# Patient Record
Sex: Female | Born: 1940
Health system: Southern US, Community
[De-identification: ages and names within clinical notes are randomized; demographics above are authoritative.]

## PROBLEM LIST (undated history)

## (undated) DIAGNOSIS — M1990 Unspecified osteoarthritis, unspecified site: Secondary | ICD-10-CM

## (undated) DIAGNOSIS — K635 Polyp of colon: Secondary | ICD-10-CM

## (undated) DIAGNOSIS — K219 Gastro-esophageal reflux disease without esophagitis: Secondary | ICD-10-CM

## (undated) DIAGNOSIS — R000 Tachycardia, unspecified: Secondary | ICD-10-CM

## (undated) DIAGNOSIS — Z8719 Personal history of other diseases of the digestive system: Secondary | ICD-10-CM

## (undated) DIAGNOSIS — G589 Mononeuropathy, unspecified: Secondary | ICD-10-CM

## (undated) DIAGNOSIS — Z8669 Personal history of other diseases of the nervous system and sense organs: Secondary | ICD-10-CM

## (undated) DIAGNOSIS — D509 Iron deficiency anemia, unspecified: Secondary | ICD-10-CM

## (undated) DIAGNOSIS — E7800 Pure hypercholesterolemia, unspecified: Secondary | ICD-10-CM

## (undated) DIAGNOSIS — Z87442 Personal history of urinary calculi: Secondary | ICD-10-CM

## (undated) DIAGNOSIS — Z87891 Personal history of nicotine dependence: Secondary | ICD-10-CM

## (undated) DIAGNOSIS — Z9289 Personal history of other medical treatment: Secondary | ICD-10-CM

## (undated) DIAGNOSIS — Z973 Presence of spectacles and contact lenses: Secondary | ICD-10-CM

## (undated) DIAGNOSIS — J302 Other seasonal allergic rhinitis: Secondary | ICD-10-CM

## (undated) DIAGNOSIS — H3530 Unspecified macular degeneration: Secondary | ICD-10-CM

## (undated) DIAGNOSIS — E039 Hypothyroidism, unspecified: Secondary | ICD-10-CM

## (undated) DIAGNOSIS — T7840XA Allergy, unspecified, initial encounter: Secondary | ICD-10-CM

## (undated) DIAGNOSIS — I10 Essential (primary) hypertension: Secondary | ICD-10-CM

## (undated) HISTORY — DX: Pure hypercholesterolemia, unspecified: E78.00

## (undated) HISTORY — DX: Hypothyroidism, unspecified: E03.9

## (undated) HISTORY — DX: Personal history of urinary calculi: Z87.442

## (undated) HISTORY — PX: ABDOMINAL HYSTERECTOMY: SHX81

## (undated) HISTORY — DX: Polyp of colon: K63.5

## (undated) HISTORY — DX: Essential (primary) hypertension: I10

## (undated) HISTORY — DX: Allergy, unspecified, initial encounter: T78.40XA

## (undated) HISTORY — DX: Personal history of nicotine dependence: Z87.891

## (undated) HISTORY — DX: Gastro-esophageal reflux disease without esophagitis: K21.9

## (undated) HISTORY — DX: Tachycardia, unspecified: R00.0

## (undated) HISTORY — DX: Personal history of other medical treatment: Z92.89

## (undated) HISTORY — DX: Unspecified osteoarthritis, unspecified site: M19.90

## (undated) HISTORY — PX: APPENDECTOMY: SHX54

## (undated) HISTORY — DX: Unspecified macular degeneration: H35.30

---
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
## 1997-10-25 DIAGNOSIS — Z87442 Personal history of urinary calculi: Secondary | ICD-10-CM

## 1997-10-25 HISTORY — DX: Personal history of urinary calculi: Z87.442

## 2004-06-09 ENCOUNTER — Encounter: Admission: RE | Admit: 2004-06-09 | Discharge: 2004-06-09 | Payer: Self-pay | Admitting: Family Medicine

## 2004-08-04 ENCOUNTER — Encounter: Admission: RE | Admit: 2004-08-04 | Discharge: 2004-08-04 | Payer: Self-pay | Admitting: Orthopaedic Surgery

## 2006-04-06 ENCOUNTER — Ambulatory Visit: Payer: Self-pay | Admitting: Cardiovascular Disease

## 2006-04-08 ENCOUNTER — Ambulatory Visit: Payer: Self-pay | Admitting: Cardiology

## 2006-04-25 ENCOUNTER — Encounter: Payer: Self-pay | Admitting: Cardiology

## 2006-04-25 ENCOUNTER — Ambulatory Visit: Payer: Self-pay

## 2006-04-28 ENCOUNTER — Ambulatory Visit: Payer: Self-pay | Admitting: Cardiovascular Disease

## 2006-08-04 ENCOUNTER — Ambulatory Visit: Payer: Self-pay | Admitting: Family Medicine

## 2007-05-02 ENCOUNTER — Ambulatory Visit: Payer: Self-pay | Admitting: Gastroenterology

## 2007-05-15 ENCOUNTER — Ambulatory Visit: Payer: Self-pay | Admitting: Gastroenterology

## 2007-05-15 LAB — HM COLONOSCOPY: HM Colonoscopy: NORMAL

## 2007-05-16 ENCOUNTER — Ambulatory Visit: Payer: Self-pay

## 2007-05-23 ENCOUNTER — Ambulatory Visit: Payer: Self-pay | Admitting: Cardiovascular Disease

## 2007-08-17 ENCOUNTER — Ambulatory Visit: Payer: Self-pay | Admitting: Family Medicine

## 2008-03-25 ENCOUNTER — Ambulatory Visit: Payer: Self-pay | Admitting: Family Medicine

## 2008-03-28 ENCOUNTER — Ambulatory Visit: Payer: Self-pay | Admitting: Family Medicine

## 2008-06-07 ENCOUNTER — Encounter: Admission: RE | Admit: 2008-06-07 | Discharge: 2008-06-07 | Payer: Self-pay | Admitting: Family Medicine

## 2008-06-07 ENCOUNTER — Ambulatory Visit: Payer: Self-pay | Admitting: Family Medicine

## 2008-07-04 ENCOUNTER — Ambulatory Visit: Payer: Self-pay | Admitting: Cardiovascular Disease

## 2008-07-25 ENCOUNTER — Ambulatory Visit: Payer: Self-pay | Admitting: Family Medicine

## 2009-06-18 ENCOUNTER — Encounter (INDEPENDENT_AMBULATORY_CARE_PROVIDER_SITE_OTHER): Payer: Self-pay | Admitting: *Deleted

## 2009-07-31 LAB — HM MAMMOGRAPHY: HM Mammogram: NEGATIVE

## 2009-08-05 DIAGNOSIS — E7800 Pure hypercholesterolemia, unspecified: Secondary | ICD-10-CM

## 2009-08-05 DIAGNOSIS — I10 Essential (primary) hypertension: Secondary | ICD-10-CM | POA: Insufficient documentation

## 2009-08-05 DIAGNOSIS — K219 Gastro-esophageal reflux disease without esophagitis: Secondary | ICD-10-CM

## 2009-08-08 ENCOUNTER — Ambulatory Visit: Payer: Self-pay | Admitting: Cardiovascular Disease

## 2010-02-20 ENCOUNTER — Telehealth (INDEPENDENT_AMBULATORY_CARE_PROVIDER_SITE_OTHER): Payer: Self-pay | Admitting: *Deleted

## 2010-02-22 HISTORY — PX: COLONOSCOPY: SHX174

## 2010-03-03 ENCOUNTER — Encounter (INDEPENDENT_AMBULATORY_CARE_PROVIDER_SITE_OTHER): Payer: Self-pay | Admitting: *Deleted

## 2010-03-04 ENCOUNTER — Ambulatory Visit: Payer: Self-pay | Admitting: Gastroenterology

## 2010-03-18 ENCOUNTER — Ambulatory Visit: Payer: Self-pay | Admitting: Gastroenterology

## 2010-03-20 ENCOUNTER — Encounter: Payer: Self-pay | Admitting: Gastroenterology

## 2010-07-28 ENCOUNTER — Ambulatory Visit: Payer: Self-pay | Admitting: Family Medicine

## 2010-11-24 NOTE — Miscellaneous (Signed)
Summary: [facility] PERVISIT/PREP  Clinical Lists Changes  Medications: Added new medication of MOVIPREP 100 GM  SOLR (PEG-KCL-NACL-NASULF-NA ASC-C) As per prep instructions. - Signed Rx of MOVIPREP 100 GM  SOLR (PEG-KCL-NACL-NASULF-NA ASC-C) As per prep instructions.;  #1 x 0;  Signed;  Entered by: Wyona Almas RN;  Authorized by: Mardella Layman MD [facility];  Method used: Electronically to [facility] [id number], [place], [place]  [address], Ph: [phone], Fax: [phone] Allergies: Added new allergy or adverse reaction of * NIASPAN Observations: Added new observation of NKA: F (03/04/2010 12:49)    Prescriptions: MOVIPREP 100 GM  SOLR (PEG-KCL-NACL-NASULF-NA ASC-C) As per prep instructions.  #1 x 0   Entered by:   Wyona Almas RN   Authorized by:   Mardella Layman MD [facility]   Signed by:   Wyona Almas RN on 03/04/2010   Method used:   Electronically to        [facility] [id number]* (retail)       [address] [place], [place]  [address]       Ph: [phone]       Fax: [phone]   RxID:   [id number]

## 2010-11-24 NOTE — Procedures (Signed)
Summary: Colonoscopy  Patient: [name] Note: All result statuses are Final unless otherwise noted.  Tests: (1) Colonoscopy (COL)   COL Colonoscopy           DONE     [facility]     [address]     [place], [place]  [address]           COLONOSCOPY PROCEDURE REPORT           PATIENT:  Allison [name], Allison [name]  MR#:  [id number]     BIRTHDATE:  1940-10-27, 69 yrs. old  GENDER:  female     ENDOSCOPIST:  Vania Rea. Jarold Motto, MD, [facility]     REF. BY:     PROCEDURE DATE:  03/18/2010     PROCEDURE:  Colonoscopy with snare polypectomy     ASA CLASS:  Class II     INDICATIONS:  colorectal cancer screening, average risk, family     history of colon cancer BROTHER AGE 52.     MEDICATIONS:   Fentanyl 75 mcg IV, Versed 8 mg IV           DESCRIPTION OF PROCEDURE:   After the risks benefits and     alternatives of the procedure were thoroughly explained, informed     consent was obtained.  Digital rectal exam was performed and     revealed no abnormalities.   The LB CF-H180AL [id number] endoscope     was introduced through the anus and advanced to the cecum, which     was identified by both the appendix and ileocecal valve, limited     by a redundant colon.    The quality of the prep was excellent,     using MoviPrep.  The instrument was then slowly withdrawn as the     colon was fully examined.     <<PROCEDUREIMAGES>>           FINDINGS:  A sessile polyp was found. FLAT POLYP AT HEPATIC     FLEXURE HOT SNARE EXCISED.TO PATH FOR EXAM.  This was otherwise a     normal examination of the colon.   Retroflexed views in the rectum     revealed no abnormalities.    The scope was then withdrawn from     the patient and the procedure completed.           COMPLICATIONS:  None     ENDOSCOPIC IMPRESSION:     1) Sessile polyp     2) Otherwise normal examination     R/O ADENOMA.     RECOMMENDATIONS:     1) Follow up colonoscopy in 5 years     REPEAT EXAM:  No        ______________________________     Vania Rea. Jarold Motto, MD, [facility]           CC:           n.     eSIGNED:   Vania Rea. Patterson at 03/18/2010 11:25 AM           Kathrynn Running, [id number]  Note: An exclamation mark (!) indicates a result that was not dispersed into the flowsheet. Document Creation Date: 03/18/2010 11:26 AM _______________________________________________________________________  (1) Order result status: Final Collection or observation date-time: 03/18/2010 11:19 Requested date-time:  Receipt date-time:  Reported date-time:  Referring Physician:   Ordering Physician: Sheryn Bison [phone]) Specimen Source:  Source: Launa Grill Order Number: [id number] Lab site:   Appended Document: Colonoscopy  Procedures Next Due Date:    Colonoscopy: 02/2015

## 2010-11-24 NOTE — Procedures (Signed)
Summary: Colon   Colonoscopy  Procedure date:  05/15/2007  Findings:      Location:  [facility].   Patient Name: Allison [name], Allison [name]. MRN:  Procedure Procedures: Colonoscopy CPT: [phone].  Personnel: Endoscopist: Vania Rea. Jarold Motto, MD.  Referred By: Everardo All Susann Givens, MD.  Exam Location: Exam performed in Outpatient Clinic. Outpatient  Patient Consent: Procedure, Alternatives, Risks and Benefits discussed, consent obtained, from patient. Consent was obtained by the RN.  Indications  Increased Risk Screening: For family history of colorectal neoplasia, in  sibling age at onset: 77.  History  Current Medications: Patient is taking an non-steroidal medication. Patient is not currently taking Coumadin.  Medical/ Surgical History: Hyperlipidemia, Hypothyroidism, Hypertension, Reflux Disease,  Pre-Exam Physical: Performed May 15, 2007. Cardio-pulmonary exam, Rectal exam, Abdominal exam, Extremity exam, Mental status exam WNL.  Comments: Pt. history reviewed/updated, physical exam performed prior to initiation of sedation? yes Exam Exam: Extent of exam reached: Cecum, extent intended: Cecum.  The cecum was identified by appendiceal orifice and IC valve. Patient position: on left side. Time to Cecum: 00:06:17. Time for Withdrawl: 00:08:05. Colon retroflexion performed. Images taken. ASA Classification: II. Tolerance: good.  Monitoring: Pulse and BP monitoring, Oximetry used. Supplemental O2 given. at 2 Liters.  Colon Prep Used Golytely for colon prep. Prep results: good.  Sedation Meds: Patient assessed and found to be appropriate for moderate (conscious) sedation. Fentanyl 50 mcg. given IV. Versed 5 mg. given IV.  Instrument(s): CF 140L. Serial [id number].  Findings - NORMAL EXAM: Cecum to Rectum. Not Seen: Polyps. AVM's. Colitis. Tumors. Melanosis. Crohn's. Diverticulosis. Hemorrhoids.    Comments: Tortuous and redundant colon  noted.... Assessment Normal examination.  Events  Unplanned Interventions: No intervention was required.  Plans Medication Plan: Referring provider to order medications.  Patient Education: Patient given standard instructions for: Patient instructed to get routine colonoscopy every 5 years.  Disposition: After procedure patient sent to recovery. After recovery patient sent home.  Scheduling/Referral: Follow-Up prn.    cc: Ronnald Nian, MD    This report was created from the original endoscopy report, which was reviewed and signed by the above listed endoscopist.

## 2010-11-24 NOTE — Letter (Signed)
Summary: [facility] Instructions  [facility]  [address] [place], [place] [address]   Phone: [phone]  Fax: [phone]       Allison [name]    29-Apr-1941    MRN: [id number]        Procedure Day Allison [name]:  [facility]  03/18/10     Arrival Time:  9:30AM     Procedure Time:  10:30AM     Location of Procedure:                    _X _  [facility] (4th Floor)                        PREPARATION FOR COLONOSCOPY WITH MOVIPREP   Starting 5 days prior to your procedure 03/13/10 do not eat nuts, seeds, popcorn, corn, beans, peas,  salads, or any raw vegetables.  Do not take any fiber supplements (e.g. Metamucil, Citrucel, and Benefiber).  THE DAY BEFORE YOUR PROCEDURE         DATE: 03/17/10  DAY: TUESDAY  1.  Drink clear liquids the entire day-NO SOLID FOOD  2.  Do not drink anything colored red or purple.  Avoid juices with pulp.  No orange juice.  3.  Drink at least 64 oz. (8 glasses) of fluid/clear liquids during the day to prevent dehydration and help the prep work efficiently.  CLEAR LIQUIDS INCLUDE: Water Jello Ice Popsicles Tea (sugar ok, no milk/cream) Powdered fruit flavored drinks Coffee (sugar ok, no milk/cream) Gatorade Juice: apple, white grape, white cranberry  Lemonade Clear bullion, consomm, broth Carbonated beverages (any kind) Strained chicken noodle soup Hard Candy                             4.  In the morning, mix first dose of MoviPrep solution:    Empty 1 Pouch A and 1 Pouch B into the disposable container    Add lukewarm drinking water to the top line of the container. Mix to dissolve    Refrigerate (mixed solution should be used within 24 hrs)  5.  Begin drinking the prep at 5:00 p.m. The MoviPrep container is divided by 4 marks.   Every 15 minutes drink the solution down to the next mark (approximately 8 oz) until the full liter is complete.   6.  Follow completed prep with 16 oz of clear liquid of your choice  (Nothing red or purple).  Continue to drink clear liquids until bedtime.  7.  Before going to bed, mix second dose of MoviPrep solution:    Empty 1 Pouch A and 1 Pouch B into the disposable container    Add lukewarm drinking water to the top line of the container. Mix to dissolve    Refrigerate  THE DAY OF YOUR PROCEDURE      DATE: 03/18/10  DAY: WEDNESDAY  Beginning at 5:30AM (5 hours before procedure):         1. Every 15 minutes, drink the solution down to the next mark (approx 8 oz) until the full liter is complete.  2. Follow completed prep with 16 oz. of clear liquid of your choice.    3. You may drink clear liquids until 8:30AM (2 HOURS BEFORE PROCEDURE).   MEDICATION INSTRUCTIONS  Unless otherwise instructed, you should take regular prescription medications with a small sip of water   as early as possible the morning  of your procedure.   Additional medication instructions: Hold Atenolol-Chlorthalidone the morning of procedure.         OTHER INSTRUCTIONS  You will need a responsible adult at least 71 years of age to accompany you and drive you home.   This person must remain in the waiting room during your procedure.  Wear loose fitting clothing that is easily removed.  Leave jewelry and other valuables at home.  However, you may wish to bring a book to read or  an iPod/MP3 player to listen to music as you wait for your procedure to start.  Remove all body piercing jewelry and leave at home.  Total time from sign-in until discharge is approximately 2-3 hours.  You should go home directly after your procedure and rest.  You can resume normal activities the  day after your procedure.  The day of your procedure you should not:   Drive   Make legal decisions   Operate machinery   Drink alcohol   Return to work  You will receive specific instructions about eating, activities and medications before you leave.    The above instructions have been  reviewed and explained to me by   Wyona Almas RN  Mar 04, 2010 1:23 PM     I fully understand and can verbalize these instructions _____________________________ Date _________

## 2010-11-24 NOTE — Letter (Signed)
Summary: Patient Notice- Polyp Results  [facility]  [address] [place], [place] [address]   Phone: [phone]  Fax: [phone]        Mar 20, 2010 MRN: [id number]    Allison [name] [address] [place], [place]  [address]    Dear [name],  I am pleased to inform you that the colon polyp(s) removed during your recent colonoscopy was (were) found to be benign (no cancer detected) upon pathologic examination.  I recommend you have a repeat colonoscopy examination in 5_ years to look for recurrent polyps, as having colon polyps increases your risk for having recurrent polyps or even colon cancer in the future.  Should you develop new or worsening symptoms of abdominal pain, bowel habit changes or bleeding from the rectum or bowels, please schedule an evaluation with either your primary care physician or with me.  Additional information/recommendations:  xx__ No further action with gastroenterology is needed at this time. Please      follow-up with your primary care physician for your other healthcare      needs.  __ Please call [phone] to schedule a return visit to review your      situation.  __ Please keep your follow-up visit as already scheduled.  __ Continue treatment plan as outlined the day of your exam.  Please call us if you are having persistent problems or have questions about your condition that have not been fully answered at this time.  Sincerely,  Mardella Layman MD [facility]  This letter has been electronically signed by your physician.  Appended Document: Patient Notice- Polyp Results letter mailed.

## 2010-11-24 NOTE — Progress Notes (Signed)
Summary: recall COL   Phone Note Call from Patient Call back at Home Phone [phone]   Caller: husband Call For: Dr. Jarold Motto Reason for Call: Talk to Nurse Summary of Call: brother has passed from COL cancer and pt would like to move up COL recall date Initial call taken by: Vallarie Mare,  February 20, 2010 12:15 PM  Follow-up for Phone Call        Pt had normal colon 05/15/07.  See report. Follow-up by: Ashok Cordia RN,  February 20, 2010 12:47 PM  Additional Follow-up for Phone Call Additional follow up Details #1::        ok Additional Follow-up by: Mardella Layman MD [facility],  Feb 23, 2010 8:20 AM    Additional Follow-up for Phone Call Additional follow up Details #2::    Sch previsit and colonoscopy. Follow-up by: Ashok Cordia RN,  Feb 23, 2010 8:51 AM  Additional Follow-up for Phone Call Additional follow up Details #3:: Details for Additional Follow-up Action Taken: Pt. is sch'd for 03-18-10 for colonoscopy. Aware to bring meds, ins. card and cx policy advised Additional Follow-up by: Karna Christmas,  Feb 24, 2010 2:56 PM

## 2011-03-09 NOTE — Assessment & Plan Note (Signed)
[facility] HEALTHCARE                            CARDIOLOGY OFFICE NOTE   Allison [name], Allison [name]                     MRN:          [id number]  DATE:05/23/2007                            DOB:          August 07, 1941    SUBJECTIVE:  [name] returns today for followup.  I last saw her  back in July 2007.  She has had palpitations and dyspnea.  She has had  previous fen/Phen use.  She had a 2-D echocardiogram that was done in  July 2007, and which showed an ejection fraction of 55%-60%, normal  aortic valve with trivial mitral insufficiency and MAC.  There was no  evidence of fen/Phen valvulopathy.  Since I last saw her she has been  doing well.  She continues to get occasional exertional dyspnea.  She  has not had significant chest pain or palpitations.  I reviewed her  Myoview study today.  She was able to exercise for seven minutes and 30  seconds.  Her blood pressure response was appropriate.  Her peak heart  rate was 125.  The Myoview images were normal with an ejection fraction  of 72%.  We went over these results and she was very happy about it.   She does have hyperlipidemia and hypertension.  These seem to be well-  controlled.  I do not have a recent lipid profile on her.   PRIMARY CARE PHYSICIAN:  Allison [name], M.D.   REVIEW OF SYSTEMS:  Is otherwise negative.   CURRENT MEDICATIONS:  1. Premarin 0.6 mg daily.  2. Levothroid 112 mcg daily.  3. Lipitor 10 mg daily.  4. Chlorthalidone 25 mg daily.  5. Omeprazole 20 mg daily.  6. An aspirin daily.  7. Atenolol 50 mg daily.   PHYSICAL EXAMINATION:  GENERAL:  A healthy-appearing elderly white  female, in no distress.  Affect is appropriate.  VITAL SIGNS:  Weight is 193 pounds, respiratory rate 14, blood pressure  130/72, pulse 60 and regular.  HEENT:  Normal.  NECK:  There is no thyromegaly, no lymphadenopathy.  No JVP elevation.  No bruits.  LUNGS:  Clear with good diaphragmatic motion.  No  wheezing.  HEART:  There is an S1 and S2, without murmur.  PMI is normal.  ABDOMEN:  Benign.  Bowel sounds positive.  No hepatosplenomegaly.  No  hepatojugular reflux.  No abdominal aortic aneurysm.  No tenderness, no  bruits.  EXTREMITIES:  Distal pulses intact.  No edema.  NEUROLOGIC:  Nonfocal.  MUSCULOSKELETAL:  No weakness.  SKIN:  Warm and dry.   IMPRESSION:  1. Exertional dyspnea, not likely cardiac-related:  Normal left      ventricular function by Myoview and echocardiogram, most likely      functional.  2. History of fen/Phen use with trivial mitral regurgitation:  No      significant murmur on examination.  I do not think she needs      another echocardiogram for at least two to three years.  She is now      11 years out from fen/Phen and I doubt that she will develop  a      valvulopathy.  3. Hypertension, currently well-controlled:  Here in the office today      she was 128/70.  She will continue her diuretic and beta blocker.  4. Hypercholesterolemia:  Continue Lipitor 10 mg daily.  Follow up      with a  lipid and liver profile with Dr. Susann Givens.  5. Hypothyroidism:  Continue 112 mcg of Synthroid.  She understands      the connection between cholesterol levels and thyroid.  She will      follow up with Dr. Susann Givens and hopefully keep her TSH under 3.  6. Dyspnea on exertion with palpitations:  A negative stress test.      Myoview results are gone over with the patient.  No evidence of      coronary artery disease.  Continue risk factor modifications.     Noralyn Pick. Eden Emms, MD, [facility]  Electronically Signed    PCN/MedQ  DD: 05/23/2007  DT: 05/24/2007  Job #: [id number]

## 2011-03-09 NOTE — Assessment & Plan Note (Signed)
[name] HEALTHC[facility]                            CARDIOLOGY OFFICE NOTE   Allison [name], Allison [name]                     MRN:          [id number]  DATE:07/04/2008                            DOB:          [birth year]    Allison [name] is a 70 year old patient who I have seen for murmur and  previous Fen-phen use.  She has hypercholesterolemia and hypertension.  She is doing fairly well.  She spends 6 months of her time at [facility] [place].  She is going there in October.  Her  primary care MD increased her Zocor from 20-40 in April.  She needs to  have a followup lipid and liver profile in October; otherwise, she is  doing well.  She has not had any significant problems relative to her  heart.  There has been no palpitation, shortness of breath, PND, or  orthopnea.  No lower extremity edema.  She has had a URI or flu for the  last 4 weeks.  She has not been started on any antibiotics.  The  Benadryl does not seem to be helping.  I suggested her that she switch  to Claritin and consider Nasonex or neti pot.  I would also have her  referred back to her primary care doctor for possible Z-PAK or  doxycycline.  She has a lot of nasal congestion and nonproductive cough.   She has not had a fever.  She even wondered if she had swine flu at one  point.  At this point, she is too late in her course to consider  Tamiflu.   REVIEW OF SYSTEMS:  Otherwise negative.   MEDICATIONS:  She is currently taking;  1. Chlorthalidone 25 a day.  2. Omeprazole 20 a day.  3. Aspirin a day.  4. Vitamins.  5. Atenolol 50 a day.  6. Zocor 40 a day.  7. Synthroid 112 mcg a day.  8. Premarin.   PHYSICAL EXAMINATION:  GENERAL:  Her exam is remarkable for a elderly  white female in no distress.  VITAL SIGNS:  Weight is 196, blood pressure 124/70, pulse 64 and  regular, and respiratory rate 14, afebrile.  HEENT:  Unremarkable.  NECK:  Carotids are normal without bruit.   No lymphadenopathy,  thyromegaly, or JVP elevation.  LUNGS:  Clear.  Good diaphragmatic motion.  No wheezing.  CARDIAC:  S1 and S2 with a soft systolic murmur.  PMI normal.  ABDOMEN:  Benign.  Bowel sounds positive.  No AAA.  No tenderness.  No  bruit.  No hepatosplenomegaly or hepatojugular reflux.  EXTREMITIES:  Distal pulses are intact.  No edema.  NEURO:  Nonfocal.  SKIN:  Warm and dry.  MUSCULOSKELETAL:  No muscular weakness.   EKG is normal.   IMPRESSION:  1. Faint, benign systolic murmur despite Fen-phen use.  No need for      followup echo.  Her last echocardiogram done on April 26, 2006,      showed no significant valvular disease.  2. Upper respiratory illness.  Follow up with primary care MD.  Consider macrolide therapy with neti pot and switch to a Claritin      as an antihistamine.  3. Hypertension, currently well controlled.  Continue current dose of      diuretic and beta-blocker.  4. Hypercholesterolemia, currently not well controlled.  Zocor      increased in April.  Lipid and liver profile in October when she      goes to [place].  5. History of reflux.  Continue omeprazole.  Consider switching to      Protonix in light of her persistent cough.  Reflux may be playing a      role in this.  Overall, I think her heart is doing fine and I will      see her back in a year.     Noralyn Pick. Eden Emms, MD, Allison [name]  Electronically Signed    PCN/MedQ  DD: 07/04/2008  DT: 07/05/2008  Job #: [id number]

## 2011-04-20 ENCOUNTER — Encounter: Payer: Self-pay | Admitting: Family Medicine

## 2011-06-07 ENCOUNTER — Encounter: Payer: Self-pay | Admitting: Medical

## 2011-06-07 ENCOUNTER — Ambulatory Visit (INDEPENDENT_AMBULATORY_CARE_PROVIDER_SITE_OTHER): Payer: Medicare Other | Admitting: Medical

## 2011-06-07 VITALS — BP 130/88 | HR 64 | Temp 97.7°F | Resp 20 | Ht 67.0 in | Wt 203.0 lb

## 2011-06-07 DIAGNOSIS — M79641 Pain in right hand: Secondary | ICD-10-CM

## 2011-06-07 DIAGNOSIS — M25849 Other specified joint disorders, unspecified hand: Secondary | ICD-10-CM

## 2011-06-07 DIAGNOSIS — R300 Dysuria: Secondary | ICD-10-CM

## 2011-06-07 DIAGNOSIS — M79609 Pain in unspecified limb: Secondary | ICD-10-CM

## 2011-06-07 LAB — POCT URINALYSIS DIPSTICK
Bilirubin, UA: NEGATIVE
Blood, UA: NEGATIVE
Glucose, UA: NEGATIVE
Ketones, UA: NEGATIVE
Leukocytes, UA: POSITIVE
Nitrite, UA: NEGATIVE
Spec Grav, UA: 1.01
Urobilinogen, UA: NEGATIVE
pH, UA: 8

## 2011-06-07 NOTE — Progress Notes (Signed)
Subjective:   HPI  Allison [name] is a 70 y.o. female who presents for hand pain.  She notes 2 years ago in July 4th, was helping to break apart 2 dogs fighting.  Ended up getting bit on right hand.  She notes off and on problems with the right hand including "knots" since then.  Last week started getting fluid filled knots on right wrist and pain.  Uses wrist splint which helps, but this limits her ability to do things like cook or use the bathroom.  Motion and use of the wrist aggravates the pain.   No other aggravating or relieving factors.    She also notes bad smelling urine the last few days without other urinary symptoms.  No other c/o.  The following portions of the patient's history were reviewed and updated as appropriate: allergies, current medications, past family history, past medical history, past social history, past surgical history and problem list.  Past Medical History  Diagnosis Date  . Allergy     RHINITIS  . Hypercholesterolemia   . Hypothyroid   . Tachycardia   . Former smoker    Review of Systems Constitutional: denies fever, chills, sweats Hematology: denies bleeding or bruising problems Musculoskeletal: +right wrist swelling, pain and knots, otherwise denies arthralgias, myalgias, joint swelling, back pain Neurology: no headache, weakness, tingling, numbness Urology: no hematuria, frequently, urgency GI: no abdominal pain, nausea, vomiting, diarrhea     Objective:   Physical Exam  General appearance: alert, no distress, WD/WN, white female Skin: no erythema or ecchymosis Musculoskeletal: right dorsal hand proximally and wrist dorsal with 3 areas of swelling that appears to be 2-3 separate fluid filled, nontender, cystic lesions, not hard, but somewhat ganglion appearing.  Mild tenderness of the right wrist and hand in the same area. Otherwise bilat hands and UE nontender, no swelling, no obvious deformity Extremities: no edema, no cyanosis, no  clubbing Pulses: 2+ symmetric, upper extremities, normal cap refill Neurological: bilat arms and hands with normal strength and sensation   Assessment :    Encounter Diagnoses  Name Primary?  . Hand pain, right Yes  . Cyst of joint of hand   . Dysuria      Plan:  Hand pain/cyst - suggestive of ganglion, but exam is a little atypical.   Will refer to [facility].  She saw them in remote past for shoulder problem.   Dysuria - urine culture sent.  She was advised to call if worse symptoms of UTI in the meantime.

## 2011-06-09 ENCOUNTER — Other Ambulatory Visit: Payer: Self-pay | Admitting: Medical

## 2011-06-09 ENCOUNTER — Telehealth: Payer: Self-pay | Admitting: *Deleted

## 2011-06-09 LAB — URINE CULTURE: Colony Count: >=100000

## 2011-06-09 MED ORDER — NITROFURANTOIN MONOHYD MACRO 100 MG PO CAPS: 100.0000 mg | ORAL_CAPSULE | Freq: Two times a day (BID) | ORAL | Status: AC

## 2011-06-09 NOTE — Telephone Encounter (Addendum)
Message copied by Dorthula Perfect on Wed Jun 09, 2011 12:01 PM ------      Message from: Aleen Campi, DAVID S      Created: Wed Jun 09, 2011  8:18 AM       Urine culture +.  I will send out Macrobid antibiotic.   Pt notified of urine culture results and is aware of antibiotic sent to pharmacy. CM, LPN

## 2012-06-17 ENCOUNTER — Ambulatory Visit (INDEPENDENT_AMBULATORY_CARE_PROVIDER_SITE_OTHER): Payer: Medicare Other | Admitting: Emergency Medicine

## 2012-06-17 VITALS — BP 135/74 | HR 69 | Temp 97.3°F | Resp 16 | Ht 67.0 in | Wt 192.4 lb

## 2012-06-17 DIAGNOSIS — M25529 Pain in unspecified elbow: Secondary | ICD-10-CM

## 2012-06-17 LAB — POCT CBC
Granulocyte percent: 75.6 %G (ref 37–80)
HCT, POC: 40.7 % (ref 37.7–47.9)
Hemoglobin: 12.6 g/dL (ref 12.2–16.2)
Lymph, poc: 1.9 (ref 0.6–3.4)
MCH, POC: 31.2 pg (ref 27–31.2)
MCHC: 31 g/dL — AB (ref 31.8–35.4)
MCV: 100.8 fL — AB (ref 80–97)
MID (cbc): 0.6 (ref 0–0.9)
MPV: 10.4 fL (ref 0–99.8)
POC Granulocyte: 7.6 — AB (ref 2–6.9)
POC LYMPH PERCENT: 18.6 %L (ref 10–50)
POC MID %: 5.8 %M (ref 0–12)
Platelet Count, POC: 181 10*3/uL (ref 142–424)
RBC: 4.04 M/uL (ref 4.04–5.48)
RDW, POC: 15.2 %
WBC: 10 10*3/uL (ref 4.6–10.2)

## 2012-06-17 MED ORDER — SULFAMETHOXAZOLE-TRIMETHOPRIM 800-160 MG PO TABS: 1.0000 | ORAL_TABLET | Freq: Two times a day (BID) | ORAL | Status: AC

## 2012-06-17 MED ORDER — NAPROXEN SODIUM 550 MG PO TABS: 550.0000 mg | ORAL_TABLET | Freq: Two times a day (BID) | ORAL | Status: AC

## 2012-06-17 NOTE — Progress Notes (Signed)
Date:  06/17/2012   Name:  Allison [name]   DOB:  [birth year]   MRN:  [id number] Gender: female  Age: 71 y.o.  PCP:  No primary provider on file.    Chief Complaint: left elbow swelling   History of Present Illness:  Allison [name] is a 71 y.o. pleasant patient who presents with the following:  Spent the day yesterday gardening and last night noted a swelling distal to her olecranon on the extensor surface of the forearm.  She recalls no history of injury or overuse.  No bites or stings.  Now has marked swelling and tenderness.  Little erythema and no cellulitis.  Not fluctuant.    Patient Active Problem List  Diagnosis  . HYPERCHOLESTEROLEMIA  . HYPERTENSION  . GERD  . MURMUR    Past Medical History  Diagnosis Date  . Allergy     RHINITIS  . Hypercholesterolemia   . Hypothyroid   . Tachycardia   . Former smoker     Past Surgical History  Procedure Date  . Abdominal hysterectomy     History  Substance Use Topics  . Smoking status: Former Smoker    Quit date: 10/24/1993  . Smokeless tobacco: Never Used  . Alcohol Use: 1.5 oz/week    3 drink(s) per week    Family History  Problem Relation Age of Onset  . Arthritis Mother   . Diabetes Mother   . Arthritis Father   . Cancer Paternal Grandmother     Allergies  Allergen Reactions  . Niacin     REACTION: rash    Medication list has been reviewed and updated.  Current Outpatient Prescriptions on File Prior to Visit  Medication Sig Dispense Refill  . aspirin 81 MG tablet Take 81 mg by mouth daily.        Marland Kitchen atenolol-chlorthalidone (TENORETIC) 50-25 MG per tablet Take 1 tablet by mouth daily.      Marland Kitchen co-enzyme Q-10 30 MG capsule Take 30 mg by mouth daily.        Marland Kitchen estrogens, conjugated, (PREMARIN) 0.3 MG tablet Take 0.3 mg by mouth daily. Take daily for 21 days then do not take for 7 days.       Marland Kitchen levothyroxine (SYNTHROID, LEVOTHROID) 112 MCG tablet Take 112 mcg by mouth daily.        Marland Kitchen omeprazole  (PRILOSEC) 20 MG capsule Take 20 mg by mouth daily.        . simvastatin (ZOCOR) 80 MG tablet Take 80 mg by mouth at bedtime.        Marland Kitchen atenolol (TENORMIN) 50 MG tablet Take 50 mg by mouth daily.       . diphenhydrAMINE (BENADRYL) 12.5 MG chewable tablet Chew 12.5 mg by mouth 4 (four) times daily as needed.          Review of Systems:  As per HPI, otherwise negative.    Physical Examination: Filed Vitals:   06/17/12 1527  BP: 135/74  Pulse: 69  Temp: 97.3 F (36.3 C)  Resp: 16   Filed Vitals:   06/17/12 1527  Height: 5\' 7"  (1.702 m)  Weight: 192 lb 6.4 oz (87.272 kg)   Body mass index is 30.13 kg/(m^2). Ideal Body Weight: Weight in (lb) to have BMI = 25: 159.3    GEN: WDWN, NAD, Non-toxic, Alert & Oriented x 3 HEENT: Atraumatic, Normocephalic.  Ears and Nose: No external deformity. EXTR: No clubbing/cyanosis/edema NEURO: Normal gait.  PSYCH: Normally interactive. Conversant. Not  depressed or anxious appearing.  Calm demeanor.  Left elbow:  Full active and passive ROM.  No cellulitis or fluctuance.  Not involving olecranon bursa  Assessment and Plan: Hematoma elbow Local heat Follow up as needed Septra anaprox  Carmelina Dane, MD  Results for orders placed in visit on 06/17/12  POCT CBC      Component Value Range   WBC 10.0  4.6 - 10.2 K/uL   Lymph, poc 1.9  0.6 - 3.4   POC LYMPH PERCENT 18.6  10 - 50 %L   MID (cbc) 0.6  0 - 0.9   POC MID % 5.8  0 - 12 %M   POC Granulocyte 7.6 (*) 2 - 6.9   Granulocyte percent 75.6  37 - 80 %G   RBC 4.04  4.04 - 5.48 M/uL   Hemoglobin 12.6  12.2 - 16.2 g/dL   HCT, POC 16.1  09.6 - 47.9 %   MCV 100.8 (*) 80 - 97 fL   MCH, POC 31.2  27 - 31.2 pg   MCHC 31.0 (*) 31.8 - 35.4 g/dL   RDW, POC 04.5     Platelet Count, POC 181  142 - 424 K/uL   MPV 10.4  0 - 99.8 fL

## 2012-08-08 LAB — HM MAMMOGRAPHY

## 2012-08-08 LAB — HM DEXA SCAN

## 2012-08-10 ENCOUNTER — Encounter: Payer: Self-pay | Admitting: Internal Medicine

## 2013-01-11 ENCOUNTER — Encounter: Payer: Self-pay | Admitting: Medical

## 2013-02-16 ENCOUNTER — Other Ambulatory Visit: Payer: Self-pay | Admitting: *Deleted

## 2013-02-16 DIAGNOSIS — R0989 Other specified symptoms and signs involving the circulatory and respiratory systems: Secondary | ICD-10-CM

## 2013-02-22 ENCOUNTER — Ambulatory Visit
Admission: RE | Admit: 2013-02-22 | Discharge: 2013-02-22 | Disposition: A | Discharge status: Home / Self Care | Payer: Medicare Other | Source: Ambulatory Visit | Attending: *Deleted | Admitting: *Deleted

## 2013-02-22 DIAGNOSIS — R0989 Other specified symptoms and signs involving the circulatory and respiratory systems: Secondary | ICD-10-CM

## 2014-03-23 ENCOUNTER — Ambulatory Visit (INDEPENDENT_AMBULATORY_CARE_PROVIDER_SITE_OTHER): Payer: Medicare Other | Admitting: Family Medicine

## 2014-03-23 VITALS — BP 148/60 | HR 91 | Temp 97.3°F | Resp 16 | Ht 66.0 in | Wt 192.8 lb

## 2014-03-23 DIAGNOSIS — M545 Low back pain, unspecified: Secondary | ICD-10-CM

## 2014-03-23 DIAGNOSIS — S335XXA Sprain of ligaments of lumbar spine, initial encounter: Secondary | ICD-10-CM

## 2014-03-23 DIAGNOSIS — S39012A Strain of muscle, fascia and tendon of lower back, initial encounter: Secondary | ICD-10-CM

## 2014-03-23 DIAGNOSIS — R10A0 Flank pain, unspecified side: Secondary | ICD-10-CM

## 2014-03-23 DIAGNOSIS — R109 Unspecified abdominal pain: Secondary | ICD-10-CM

## 2014-03-23 LAB — POCT URINALYSIS DIPSTICK
Bilirubin, UA: NEGATIVE
Blood, UA: NEGATIVE
Glucose, UA: NEGATIVE
Ketones, UA: NEGATIVE
Leukocytes, UA: NEGATIVE
Nitrite, UA: NEGATIVE
Protein, UA: NEGATIVE
Spec Grav, UA: 1.01
Urobilinogen, UA: 0.2
pH, UA: 6.5

## 2014-03-23 LAB — POCT UA - MICROSCOPIC ONLY
Casts, Ur, LPF, POC: NEGATIVE
Crystals, Ur, HPF, POC: NEGATIVE
Mucus, UA: NEGATIVE
RBC, urine, microscopic: NEGATIVE
Yeast, UA: NEGATIVE

## 2014-03-23 MED ORDER — METHOCARBAMOL 500 MG PO TABS: ORAL_TABLET | ORAL | Status: DC

## 2014-03-23 MED ORDER — NAPROXEN 500 MG PO TABS: 500.0000 mg | ORAL_TABLET | Freq: Two times a day (BID) | ORAL | Status: DC

## 2014-03-23 NOTE — Patient Instructions (Signed)
Try to avoid heavy lifting or straining. However light activity and keeping your back moving some is good for you.  Take the muscle relaxants one in the morning, one in the afternoon, and one or 2 at bedtime  Take the anti-inflammatory pain reliever, naproxen, one twice daily for back pain  Return if worse or not improving. I would expect this to be improving over the next 5-7 days for

## 2014-03-23 NOTE — Progress Notes (Signed)
Subjective: 73 year old lady who has been having low back pain for the last 3 days. It hurts her in the low back and across the lower flanks. She knows of no specific injury. In fact she has been less physical activity this week and noticed. It hurts more sitting than lying down. No radiation of the pain. No nausea vomiting diarrhea constipation. No urinary symptoms. Has not had major problems with her back in the past. There is no radiculopathy.  Objective: Pleasant alert lady in no major distress. Has fair range of motion of her back with anterior flexion and lateral tilt and rotation she does have pain in the very low back and around to the flanks. Deep tender reflexes are symmetrical. Straight leg raising test is seated and laying are negative. Abdomen is soft with some mild low abdominal tenderness, nonspecific.   Results for orders placed in visit on 03/23/14  POCT UA - MICROSCOPIC ONLY      Result Value Ref Range   WBC, Ur, HPF, POC 0-1     RBC, urine, microscopic neg     Bacteria, U Microscopic trace     Mucus, UA neg     Epithelial cells, urine per micros 6-11     Crystals, Ur, HPF, POC neg     Casts, Ur, LPF, POC neg     Yeast, UA neg    POCT URINALYSIS DIPSTICK      Result Value Ref Range   Color, UA yellow     Clarity, UA clear     Glucose, UA neg     Bilirubin, UA neg     Ketones, UA neg     Spec Grav, UA 1.010     Blood, UA neg     pH, UA 6.5     Protein, UA neg     Urobilinogen, UA 0.2     Nitrite, UA neg     Leukocytes, UA Negative     Assessment: Low back pain and strain  Plan: Muscle relaxant and anti-inflammatory medication. Given a little bit of time. If it is not improving she will need further evaluation.

## 2014-03-25 HISTORY — PX: OTHER SURGICAL HISTORY: SHX169

## 2014-04-07 ENCOUNTER — Ambulatory Visit (INDEPENDENT_AMBULATORY_CARE_PROVIDER_SITE_OTHER): Payer: Medicare Other | Admitting: Emergency Medicine

## 2014-04-07 VITALS — BP 126/62 | HR 88 | Temp 97.7°F | Resp 20 | Ht 67.0 in | Wt 189.4 lb

## 2014-04-07 DIAGNOSIS — J209 Acute bronchitis, unspecified: Secondary | ICD-10-CM

## 2014-04-07 MED ORDER — HYDROCOD POLST-CHLORPHEN POLST 10-8 MG/5ML PO LQCR: 5.0000 mL | Freq: Two times a day (BID) | ORAL | Status: DC | PRN

## 2014-04-07 MED ORDER — AZITHROMYCIN 250 MG PO TABS: ORAL_TABLET | ORAL | Status: DC

## 2014-04-07 NOTE — Progress Notes (Signed)
[facility] [address] [place] [address] [phone]  Date:  04/07/2014   Name:  Allison [name]   DOB:  [birth year]   MRN:  [id number]  PCP:  No PCP Per Patient    Chief Complaint: Cough   History of Present Illness:  Allison [name] is a 73 y.o. very pleasant female patient who presents with the following:  Has a cough productive of purulent sputum.  Some exertional shortness of breath. Now has pain in right anterior inframammary chest wall worse with cough.  No fever or chills, nausea or vomiting, coryza or sore throat.  Non smoker.  No ill contacts.  No improvement with over the counter medications or other home remedies. Denies other complaint or health concern today.   Patient Active Problem List   Diagnosis Date Noted  . HYPERCHOLESTEROLEMIA 08/05/2009  . HYPERTENSION 08/05/2009  . GERD 08/05/2009  . MURMUR 08/05/2009    Past Medical History  Diagnosis Date  . Allergy     RHINITIS  . Hypercholesterolemia   . Hypothyroid   . Tachycardia   . Former smoker     Past Surgical History  Procedure Laterality Date  . Abdominal hysterectomy    . Appendectomy      History  Substance Use Topics  . Smoking status: Former Smoker -- 1.00 packs/day for 20 years    Types: Cigarettes    Quit date: 10/24/1993  . Smokeless tobacco: Never Used  . Alcohol Use: 1.5 oz/week    3 drink(s) per week    Family History  Problem Relation Age of Onset  . Arthritis Mother   . Diabetes Mother   . Arthritis Father   . Cancer Paternal Grandmother   . Diabetes Father   . Hyperlipidemia Father   . Cancer Brother   . Heart failure Sister   . Hyperlipidemia Sister     Allergies  Allergen Reactions  . Niacin     REACTION: rash    Medication list has been reviewed and updated.  Current Outpatient Prescriptions on File Prior to Visit  Medication Sig Dispense Refill  . aspirin 81 MG tablet Take 81 mg by mouth daily.        . beta carotene  w/minerals (OCUVITE) tablet Take 1 tablet by mouth daily.      Marland Kitchen co-enzyme Q-10 30 MG capsule Take 30 mg by mouth daily.        Marland Kitchen estrogens, conjugated, (PREMARIN) 0.3 MG tablet Take 0.3 mg by mouth daily. Take daily for 21 days then do not take for 7 days.       Marland Kitchen levothyroxine (SYNTHROID, LEVOTHROID) 150 MCG tablet Take 150 mcg by mouth daily before breakfast.      . losartan-hydrochlorothiazide (HYZAAR) 100-25 MG per tablet Take 1 tablet by mouth daily.      . methocarbamol (ROBAXIN) 500 MG tablet Take one in the morning, one in the afternoon, and 1-2 at bedtime for muscle relaxant  30 tablet  0  . naproxen (NAPROSYN) 500 MG tablet Take 1 tablet (500 mg total) by mouth 2 (two) times daily with a meal.  20 tablet  0  . omeprazole (PRILOSEC) 20 MG capsule Take 20 mg by mouth daily.        . simvastatin (ZOCOR) 80 MG tablet Take 80 mg by mouth at bedtime.         No current facility-administered medications on file prior to visit.    Review of Systems:  As per HPI, otherwise negative.    Physical Examination: Filed Vitals:   04/07/14 1350  BP: 126/62  Pulse: 88  Temp: 97.7 F (36.5 C)  Resp: 20   Filed Vitals:   04/07/14 1350  Height: 5\' 7"  (1.702 m)  Weight: 189 lb 6.4 oz (85.911 kg)   Body mass index is 29.66 kg/(m^2). Ideal Body Weight: Weight in (lb) to have BMI = 25: 159.3  GEN: WDWN, NAD, Non-toxic, A & O x 3 HEENT: Atraumatic, Normocephalic. Neck supple. No masses, No LAD. Ears and Nose: No external deformity. CV: RRR, No M/G/R. No JVD. No thrill. No extra heart sounds. PULM: CTA B, no wheezes, crackles, rhonchi. No retractions. No resp. distress. No accessory muscle use. ABD: S, NT, ND, +BS. No rebound. No HSM. EXTR: No c/c/e NEURO Normal gait.  PSYCH: Normally interactive. Conversant. Not depressed or anxious appearing.  Calm demeanor.    Assessment and Plan: Bronchitis zpak tussionex   Signed,  Ellison Carwin, MD

## 2014-04-07 NOTE — Patient Instructions (Signed)

## 2014-04-18 ENCOUNTER — Encounter: Payer: Self-pay | Admitting: Family Medicine

## 2014-04-18 ENCOUNTER — Ambulatory Visit (INDEPENDENT_AMBULATORY_CARE_PROVIDER_SITE_OTHER): Payer: Medicare Other | Admitting: Family Medicine

## 2014-04-18 VITALS — BP 112/70 | HR 80 | Temp 97.5°F | Ht 66.0 in | Wt 188.0 lb

## 2014-04-18 DIAGNOSIS — R059 Cough, unspecified: Secondary | ICD-10-CM

## 2014-04-18 DIAGNOSIS — R05 Cough: Secondary | ICD-10-CM

## 2014-04-18 MED ORDER — BENZONATATE 200 MG PO CAPS: 200.0000 mg | ORAL_CAPSULE | Freq: Three times a day (TID) | ORAL | Status: DC | PRN

## 2014-04-18 MED ORDER — HYDROCODONE-HOMATROPINE 5-1.5 MG/5ML PO SYRP: 5.0000 mL | ORAL_SOLUTION | Freq: Three times a day (TID) | ORAL | Status: DC | PRN

## 2014-04-18 NOTE — Progress Notes (Signed)
Chief Complaint  Patient presents with  . Bronchitis    saw [facility] 04/07/14 diagnosed with bronchitis, took all rx'd meds -still has cough(clear mucus). Is concerned bc she is having cataract surgery next Monday and they will not do the procedure if she is coughing.     She was seen at the urgent care 6/14 with complaint of 3 days of cough productive of purulent sputum. Some exertional shortness of breath, and then developed pain in right anterior chest wall worse with cough. She was prescribed a Z-pak and Tussionex for diagnosis of bronchitis.  She seemed better at first, the phlegm production pretty much resolved, just some clear mucus occasionally.  She now has developed a dry hacking cough.  Last night she sipped on ginger brandy to use prn coughing spells. She feels winded with exertion at times.  Feels slightly dizzy when she stands quickly. Cough seems to be the same during the day and at night, not triggered by exertion/activity or any other known triggers. Still has slight discomfort under the right breast only when coughing--improved from prior to taking the antibiotics.  She completed the z-pak, and also finished the Tussionex--she reports it only lasted 6 days, taking it twice daily.  It was helpful when she was taking it, controlling the cough.  She is scheduled for cataract surgery for Monday, which will be cancelled if she is coughing.  Past Medical History  Diagnosis Date  . Allergy     RHINITIS  . Hypercholesterolemia   . Hypothyroid   . Tachycardia   . Former smoker    Past Surgical History  Procedure Laterality Date  . Abdominal hysterectomy    . Appendectomy     History   Social History  . Marital Status: Married    Spouse Name: N/A    Number of Children: N/A  . Years of Education: N/A   Occupational History  . Not on file.   Social History Main Topics  . Smoking status: Former Smoker -- 1.00 packs/day for 20 years    Types: Cigarettes    Quit date:  10/24/1993  . Smokeless tobacco: Never Used  . Alcohol Use: 1.5 oz/week    3 drink(s) per week     Comment: 4-5 glasses of wine per week.   . Drug Use: No  . Sexual Activity: Not on file   Other Topics Concern  . Not on file   Social History Narrative  . No narrative on file   Outpatient Encounter Prescriptions as of 04/18/2014  Medication Sig  . aspirin 81 MG tablet Take 81 mg by mouth daily.    . beta carotene w/minerals (OCUVITE) tablet Take 2 tablets by mouth daily.   Marland Kitchen co-enzyme Q-10 30 MG capsule Take 30 mg by mouth daily.    Marland Kitchen estrogens, conjugated, (PREMARIN) 0.3 MG tablet Take 0.3 mg by mouth daily. Take daily for 21 days then do not take for 7 days.   Marland Kitchen levothyroxine (SYNTHROID, LEVOTHROID) 150 MCG tablet Take 150 mcg by mouth daily before breakfast.  . losartan-hydrochlorothiazide (HYZAAR) 100-25 MG per tablet Take 1 tablet by mouth daily.  Marland Kitchen omeprazole (PRILOSEC) 20 MG capsule Take 20 mg by mouth daily.    . simvastatin (ZOCOR) 80 MG tablet Take 80 mg by mouth at bedtime.    . benzonatate (TESSALON) 200 MG capsule Take 1 capsule (200 mg total) by mouth 3 (three) times daily as needed.  Marland Kitchen HYDROcodone-homatropine (HYCODAN) 5-1.5 MG/5ML syrup Take 5 mLs by  mouth every 8 (eight) hours as needed for cough.  . [DISCONTINUED] azithromycin (ZITHROMAX) 250 MG tablet Take 2 tabs PO x 1 dose, then 1 tab PO QD x 4 days  . [DISCONTINUED] chlorpheniramine-HYDROcodone (TUSSIONEX PENNKINETIC ER) 10-8 MG/5ML LQCR Take 5 mLs by mouth every 12 (twelve) hours as needed.  . [DISCONTINUED] methocarbamol (ROBAXIN) 500 MG tablet Take one in the morning, one in the afternoon, and 1-2 at bedtime for muscle relaxant  . [DISCONTINUED] naproxen (NAPROSYN) 500 MG tablet Take 1 tablet (500 mg total) by mouth 2 (two) times daily with a meal.   (tessalon and hycodan rx'd today, not prior to today's visit)  Allergies  Allergen Reactions  . Niacin     REACTION: rash   ROS:  Denies fevers, chills,  nausea, diarrhea.  She had one episode of nausea/vomiting (not in the last week).  Denies reflux/heartburn (controlled with meds).  She lost ten pounds--had decreased appetite during the illness, but that has resolved, normal appetite now. No bleeding/bruising, rash, exertional chest pain (just R sided with cough).  PHYSICAL EXAM: BP 112/70  Pulse 80  Temp(Src) 97.5 F (36.4 C) (Oral)  Ht 5\' 6"  (1.676 m)  Wt 188 lb (85.276 kg)  BMI 30.36 kg/m2 Well developed, pleasant female in no distress.  She is speaking easily, and is not coughing, sniffling or clearing her throat at all during the visit. HEENT:  PERRL, EOMI, conjunctiva clear. R TM obscured by cerumen, L TM and EAC is normal.  Nasal mucosa only minimally edematous, pale, with some clear mucus on the left.  OP is clear. Sinuses are nontender Neck: no lymphadenopathy  Heart: regular rate and rhythm without murmur Lungs: clear bilaterally.  Good air movement.  No wheezes, rales, ronchi. No cough with forced expiration Skin: no rashes Neuro: alert and oriented, cranial nerves intact.  Normal strength, gait Psych: normal mood, affect, hygiene and grooming  ASSESSMENT/PLAN:  Cough - Plan: benzonatate (TESSALON) 200 MG capsule, HYDROcodone-homatropine (HYCODAN) 5-1.5 MG/5ML syrup  Post-bronchitic cough.  No evidence of ongoing infection or RAD.  May have some slight component of PND, possibly related to allergies.     Drink plenty of fluids. Use benzonatate as needed for cough.  If this isn't helpful, then try Mucinex DM during the day.  Use the hycodan syrup at bedtime only if needed, if cough isn't controlled with these other medications.   Consider using claritin, since based on your exam there appears to be some clear mucus in the nose, and postnasal drainage can sometimes trigger a dry cough.  Return if increasing shortness of breath, worsening cough, fevers.  Risks/side effects of prescribed and OTC meds were reviewed in  detail.  Differential diagnosis of cough was reviewed in detail, all questions answered.

## 2014-04-18 NOTE — Patient Instructions (Signed)
  Drink plenty of fluids. Use benzonatate as needed for cough.  If this isn't helpful, then try Mucinex DM during the day.  Use the hycodan syrup at bedtime only if needed, if cough isn't controlled with these other medications.   Consider using claritin, since based on your exam there appears to be some clear mucus in the nose, and postnasal drainage can sometimes trigger a dry cough.  Return if increasing shortness of breath, worsening cough, fevers.

## 2014-04-25 ENCOUNTER — Encounter: Payer: Self-pay | Admitting: Family Medicine

## 2014-04-25 ENCOUNTER — Ambulatory Visit (INDEPENDENT_AMBULATORY_CARE_PROVIDER_SITE_OTHER): Payer: Medicare Other | Admitting: Family Medicine

## 2014-04-25 VITALS — BP 120/76 | HR 80 | Temp 98.2°F | Ht 66.0 in | Wt 189.0 lb

## 2014-04-25 DIAGNOSIS — R05 Cough: Secondary | ICD-10-CM

## 2014-04-25 DIAGNOSIS — J309 Allergic rhinitis, unspecified: Secondary | ICD-10-CM

## 2014-04-25 DIAGNOSIS — R059 Cough, unspecified: Secondary | ICD-10-CM

## 2014-04-25 MED ORDER — METHYLPREDNISOLONE (PAK) 4 MG PO TABS: ORAL_TABLET | ORAL | Status: DC

## 2014-04-25 NOTE — Patient Instructions (Signed)
Take the steroid pack as directed. Continue the claritin. If you continue to have runny nose, or it recurs after finishing the steroids, then start using Flonase (available without prescription).  Return if fevers, discolored mucus that persists, or any other concerns.

## 2014-04-25 NOTE — Progress Notes (Signed)
Chief Complaint  Patient presents with  . Cough    still coughing, last night water was just running out of her nose. This has been 4 weeks that she has been sick for. She feels like she has worsened. (did have cataract surgery this past Monday)   She continues to have nasal congestion, runny nose. Today her throat feels a little sore.  She continues to cough a lot, and is now coughing up yellow mucus (yellow in the mornings, later in the day is clear, but still gets up a lot of mucus).  Denies shortness of breath, but feels wheezy.  She is taking Mucinex DM and claritin.  She finished the hycodan, but still has about a day left of the tessalon.  She isn't sure if the tessalon is helping much.  Past Medical History  Diagnosis Date  . Allergy     RHINITIS  . Hypercholesterolemia   . Hypothyroid   . Tachycardia   . Former smoker    Past Surgical History  Procedure Laterality Date  . Abdominal hysterectomy    . Appendectomy    . Cataract surgery  03/2014   History   Social History  . Marital Status: Married    Spouse Name: N/A    Number of Children: N/A  . Years of Education: N/A   Occupational History  . Not on file.   Social History Main Topics  . Smoking status: Former Smoker -- 1.00 packs/day for 20 years    Types: Cigarettes    Quit date: 10/24/1993  . Smokeless tobacco: Never Used  . Alcohol Use: 1.5 oz/week    3 drink(s) per week     Comment: 4-5 glasses of wine per week.   . Drug Use: No  . Sexual Activity: Not on file   Other Topics Concern  . Not on file   Social History Narrative  . No narrative on file   Current Outpatient Prescriptions on File Prior to Visit  Medication Sig Dispense Refill  . aspirin 81 MG tablet Take 81 mg by mouth daily.        . benzonatate (TESSALON) 200 MG capsule Take 1 capsule (200 mg total) by mouth 3 (three) times daily as needed.  30 capsule  0  . beta carotene w/minerals (OCUVITE) tablet Take 2 tablets by mouth daily.        Marland Kitchen co-enzyme Q-10 30 MG capsule Take 30 mg by mouth daily.        Marland Kitchen estrogens, conjugated, (PREMARIN) 0.3 MG tablet Take 0.3 mg by mouth daily. Take daily for 21 days then do not take for 7 days.       Marland Kitchen levothyroxine (SYNTHROID, LEVOTHROID) 150 MCG tablet Take 150 mcg by mouth daily before breakfast.      . losartan-hydrochlorothiazide (HYZAAR) 100-25 MG per tablet Take 1 tablet by mouth daily.      Marland Kitchen omeprazole (PRILOSEC) 20 MG capsule Take 20 mg by mouth daily.        . simvastatin (ZOCOR) 80 MG tablet Take 80 mg by mouth at bedtime.        Marland Kitchen HYDROcodone-homatropine (HYCODAN) 5-1.5 MG/5ML syrup Take 5 mLs by mouth every 8 (eight) hours as needed for cough.  75 mL  0   No current facility-administered medications on file prior to visit.   Allergies  Allergen Reactions  . Niacin     REACTION: rash   ROS:  Denies chills, fever, nausea, vomiting. She had the cataract surgery, and  hasn't had any problems or complications.  No bleeding, bruising, rashes, chest pain, headaches, dizziness.  PHYSICAL EXAM: BP 120/76  Pulse 80  Temp(Src) 98.2 F (36.8 C) (Tympanic)  Ht 5\' 6"  (1.676 m)  Wt 189 lb (85.73 kg)  BMI 30.52 kg/m2 Well developed, pleasant female, who sounds nasal, some sniffling, no coughing.  She is no distress, speaking easily in full sentences HEENT:  PERRL, EOMI, conjunctiva clear.  TM's and EAC's normal (partially occluded by cerumen on right).  Nasal mucosa mild-moderately edematous, pale, clear mucus OP clear Neck: no lymphadenopathy or mass Heart: regular rate and rhythm Lungs--some end-expiratory wheezing noted intially, but cleared up with further deep breathing.  No rales, ronchi Skin: no rashes or bruising Neuro: alert and oriented, cranial nerves intact. Normal strength, gait Psych: normal mood, affect, hygiene and grooming  ASSESSMENT/PLAN:  Cough - related to postnasal drainage, as well as post-bronchitic, and some wheezing - Plan: methylPREDNIsolone (MEDROL  DOSPACK) 4 MG tablet  Allergic rhinitis, unspecified allergic rhinitis type - Plan: methylPREDNIsolone (MEDROL DOSPACK) 4 MG tablet  Risks and side effects of steroids were reviewed.  Continue antihistamines.  If runny nose/cough recurs after completing the oral steroids, then try Flonase (OTC).  Return if fever, persistent discolored mucus, worsening shortness of breath, or other concerns.

## 2014-05-02 ENCOUNTER — Ambulatory Visit (INDEPENDENT_AMBULATORY_CARE_PROVIDER_SITE_OTHER): Payer: Medicare Other | Admitting: Family Medicine

## 2014-05-02 ENCOUNTER — Encounter: Payer: Self-pay | Admitting: Family Medicine

## 2014-05-02 VITALS — BP 120/80 | HR 95 | Temp 97.7°F | Wt 184.0 lb

## 2014-05-02 DIAGNOSIS — J209 Acute bronchitis, unspecified: Secondary | ICD-10-CM

## 2014-05-02 MED ORDER — AMOXICILLIN-POT CLAVULANATE 875-125 MG PO TABS: 1.0000 | ORAL_TABLET | Freq: Two times a day (BID) | ORAL | Status: DC

## 2014-05-02 NOTE — Progress Notes (Signed)
   Subjective:    Patient ID: Allison [name], female    DOB: 1941/01/17, 73 y.o.   MRN: [id number]  HPI She is here for evaluation of continued difficulty with productive cough. She was seen at the end of May and treated with a Z-Pak and Tussionex. She has been seen twice since then and treated conservatively with cough suppressant and prednisone. She continues had difficulty with productive cough but no fever, chills, sore throat, earache, fatigue or malaise. She does not smoke and has no underlying allergies.   Review of Systems     Objective:   Physical Exam alert and in no distress. Tympanic membranes and canals are normal. Throat is clear. Tonsils are normal. Neck is supple without adenopathy or thyromegaly. Cardiac exam shows a regular sinus rhythm without murmurs or gallops. Lungs are clear to auscultation. The medical record was reviewed.       Assessment & Plan:  Acute bronchitis, unspecified organism - Plan: amoxicillin-clavulanate (AUGMENTIN) 875-125 MG per tablet  she is to call if not entirely better when she finishes the antibiotic.

## 2014-05-02 NOTE — Patient Instructions (Signed)
Take all the antibiotic and if not totally back to normal when you finish, give me a call 

## 2014-07-16 ENCOUNTER — Encounter: Payer: Self-pay | Admitting: Family Medicine

## 2014-07-16 ENCOUNTER — Ambulatory Visit (INDEPENDENT_AMBULATORY_CARE_PROVIDER_SITE_OTHER): Payer: Medicare Other | Admitting: Family Medicine

## 2014-07-16 VITALS — BP 130/72 | HR 64 | Temp 98.0°F | Ht 67.0 in | Wt 188.0 lb

## 2014-07-16 DIAGNOSIS — N390 Urinary tract infection, site not specified: Secondary | ICD-10-CM

## 2014-07-16 LAB — POCT URINALYSIS DIPSTICK
Bilirubin, UA: NEGATIVE
Glucose, UA: NEGATIVE
Ketones, UA: NEGATIVE
Nitrite, UA: NEGATIVE
Spec Grav, UA: <=1.005
Urobilinogen, UA: NEGATIVE
pH, UA: 7

## 2014-07-16 MED ORDER — SULFAMETHOXAZOLE-TMP DS 800-160 MG PO TABS: 1.0000 | ORAL_TABLET | Freq: Two times a day (BID) | ORAL | Status: DC

## 2014-07-16 NOTE — Progress Notes (Signed)
   Subjective:    Patient ID: Allison [name], female    DOB: Sep 13, 1941, 73 y.o.   MRN: [id number]  HPI She has a five-day history of low back pain and a one-day history of dysuria, urgency and frequency but no fever, chills. The back pain is not made worse with physical activity. She states that the symptoms are similar to a previous UTI   Review of Systems     Objective:   Physical Exam Alert and in no distress full motion of her back. No pain on motion. Abdominal exam shows no masses or tenderness. Urinalysis microscopic was contaminated.       Assessment & Plan:  UTI (lower urinary tract infection) - Plan: POCT Urinalysis Dipstick, sulfamethoxazole-trimethoprim (BACTRIM DS) 800-160 MG per tablet  Although she had no objective symptoms, I will treat her since clinically the symptoms are similar to her previous UTI.

## 2014-07-16 NOTE — Patient Instructions (Signed)
Use Azo-Standard to help with the pain and see what that'll do

## 2014-07-25 ENCOUNTER — Encounter: Payer: Self-pay | Admitting: Gastroenterology

## 2014-08-09 LAB — HM DEXA SCAN

## 2014-08-09 LAB — HM COLONOSCOPY

## 2014-08-15 ENCOUNTER — Encounter: Payer: Self-pay | Admitting: Family Medicine

## 2014-08-15 ENCOUNTER — Telehealth: Payer: Self-pay

## 2014-08-15 NOTE — Telephone Encounter (Signed)
Pt informed low bone mass and pt states she already takes calcium and vit d with a multi

## 2014-08-16 ENCOUNTER — Encounter: Payer: Self-pay | Admitting: Internal Medicine

## 2014-10-21 ENCOUNTER — Ambulatory Visit (INDEPENDENT_AMBULATORY_CARE_PROVIDER_SITE_OTHER): Payer: Medicare Other | Admitting: Family Medicine

## 2014-10-21 ENCOUNTER — Encounter: Payer: Self-pay | Admitting: Family Medicine

## 2014-10-21 ENCOUNTER — Ambulatory Visit
Admission: RE | Admit: 2014-10-21 | Discharge: 2014-10-21 | Disposition: A | Discharge status: Home / Self Care | Payer: Medicare Other | Source: Ambulatory Visit | Attending: Family Medicine | Admitting: Family Medicine

## 2014-10-21 VITALS — BP 128/78 | HR 75 | Wt 182.0 lb

## 2014-10-21 DIAGNOSIS — Z23 Encounter for immunization: Secondary | ICD-10-CM | POA: Diagnosis not present

## 2014-10-21 DIAGNOSIS — M545 Low back pain: Secondary | ICD-10-CM

## 2014-10-21 NOTE — Progress Notes (Signed)
   Subjective:    Patient ID: Allison [name], female    DOB: 15-Nov-1940, 73 y.o.   MRN: [id number]  HPI She complains of a three-month history of low back pain. Sitting or standing makes this worse.. Bending has no effect. No numbness, tingling or weakness in her legs. No history of previous back trouble. She has had a DEXA and mammogram this year. She is scheduled to have blood work done in [place]. She does plan to move back to this area in the near future.   Review of Systems     Objective:   Physical Exam Alert and in no distress. Full motion of her back. Normal lumbar curve and motion. No palpable tenderness. Normal motor, sensory and DTRs. Negative straight leg raising. Normal hip motion.       Assessment & Plan:  Low back pain without sciatica, unspecified back pain laterality - Plan: DG Lumbar Spine Complete

## 2014-10-30 ENCOUNTER — Telehealth: Payer: Self-pay | Admitting: Internal Medicine

## 2014-10-30 NOTE — Telephone Encounter (Deleted)
Pt's phone # is [phone]

## 2014-10-30 NOTE — Telephone Encounter (Addendum)
Pt says she was to get and order for therapy along with a script for pain meds and this was to be mailed to her at her [place] address at [address] [place], [place], [address]. Pt's phone # [phone]

## 2014-10-30 NOTE — Telephone Encounter (Signed)
Pt was suppose to be referred to PT for back pain but wanted an rx for it to get done in [place]. Is this okay. If so how long do you want her to have PT done. I will get correct address where pt wants rx mailed to

## 2014-10-31 NOTE — Telephone Encounter (Signed)
I'm not sure what pain med she's talking about. If she has been on ibuprofen regularly, call in Voltaren 75 twice a day and give her a one-month supply

## 2014-10-31 NOTE — Telephone Encounter (Signed)
Pt is not taking ibuprofen regularly but would like rx sent to her by mail when sending rx for PT for back pain. Will mail both scripts to pt [place] address

## 2015-01-03 ENCOUNTER — Encounter: Payer: Self-pay | Admitting: *Deleted

## 2015-01-22 ENCOUNTER — Encounter: Payer: Self-pay | Admitting: Internal Medicine

## 2015-04-21 ENCOUNTER — Encounter: Payer: Self-pay | Admitting: Internal Medicine

## 2015-06-24 ENCOUNTER — Ambulatory Visit (AMBULATORY_SURGERY_CENTER): Payer: Self-pay | Admitting: *Deleted

## 2015-06-24 VITALS — Ht 66.0 in | Wt 185.0 lb

## 2015-06-24 DIAGNOSIS — Z860100 Personal history of colon polyps, unspecified: Secondary | ICD-10-CM

## 2015-06-24 NOTE — Progress Notes (Signed)
Patient denies any allergies to egg or soy products. Patient denies complications with anesthesia/sedation.  Patient denies oxygen use at home and denies diet medications. Emmi instructions for colonoscopy explained and patient denied.

## 2015-07-08 ENCOUNTER — Encounter: Payer: Self-pay | Admitting: Internal Medicine

## 2015-07-08 ENCOUNTER — Ambulatory Visit (AMBULATORY_SURGERY_CENTER): Payer: Medicare Other | Admitting: Internal Medicine

## 2015-07-08 VITALS — BP 117/57 | HR 75 | Temp 97.4°F | Resp 19 | Ht 66.0 in | Wt 185.0 lb

## 2015-07-08 DIAGNOSIS — Z860100 Personal history of colon polyps, unspecified: Secondary | ICD-10-CM

## 2015-07-08 DIAGNOSIS — Z800 Family history of malignant neoplasm of digestive organs: Secondary | ICD-10-CM | POA: Diagnosis not present

## 2015-07-08 MED ORDER — SODIUM CHLORIDE 0.9 % IV SOLN: 500.0000 mL | INTRAVENOUS | Status: DC

## 2015-07-08 NOTE — Op Note (Signed)
[facility]  [address] [place], [id number]   COLONOSCOPY PROCEDURE REPORT  PATIENT: Allison [name], Allison [name]  MR#: [id number] BIRTHDATE: 01-Jul-1941 , 74  yrs. old GENDER: female ENDOSCOPIST: Jerene Bears, MD PROCEDURE DATE:  07/08/2015 PROCEDURE:   Colonoscopy, surveillance First Screening Colonoscopy - Avg.  risk and is 50 yrs.  old or older - No.  Prior Negative Screening - Now for repeat screening. N/A  History of Adenoma - Now for follow-up colonoscopy & has been > or = to 3 yrs.  N/A  Polyps removed today? No Recommend repeat exam, <10 yrs? Yes high risk ASA CLASS:   Class III INDICATIONS:Surveillance due to prior colonic neoplasia (hyperplastic ascending polyp 2011) and FH Colon cancer in brother.  MEDICATIONS: Monitored anesthesia care and Propofol 250 mg IV  DESCRIPTION OF PROCEDURE:   After the risks benefits and alternatives of the procedure were thoroughly explained, informed consent was obtained.  The digital rectal exam revealed no rectal mass.   The LB [id number]  endoscope was introduced through the anus and advanced to the cecum, which was identified by both the appendix and ileocecal valve. No adverse events experienced. The quality of the prep was good.  (Suprep was used)  The instrument was then slowly withdrawn as the colon was fully examined. Estimated blood loss is zero unless otherwise noted in this procedure report.      COLON FINDINGS: Small angiodysplastic lesion was found at the cecum. The examination was otherwise normal.  No polyps or cancers seen. Retroflexed views revealed internal hemorrhoids. The time to cecum = 7.6 Withdrawal time = 9.3   The scope was withdrawn and the procedure completed.  COMPLICATIONS: There were no immediate complications.  ENDOSCOPIC IMPRESSION: 1.   Small angiodysplastic lesion at the cecum 2.   The examination was otherwise normal  RECOMMENDATIONS: Given your age, you may not need another  colonoscopy for colon cancer screening or polyp surveillance.  These types of tests usually stop around the age 14.  This can be discussed in 5 years based on your overall health at that time.  eSigned:  Jerene Bears, MD 07/08/2015 11:21 AM   cc: Jill Alexanders, MD and The Patient

## 2015-07-08 NOTE — Progress Notes (Signed)
Patient awakening,vss,report to rn 

## 2015-07-08 NOTE — Patient Instructions (Signed)
YOU HAD AN ENDOSCOPIC PROCEDURE TODAY AT THE [facility]:   Refer to the procedure report that was given to you for any specific questions about what was found during the examination.  If the procedure report does not answer your questions, please call your gastroenterologist to clarify.  If you requested that your care partner not be given the details of your procedure findings, then the procedure report has been included in a sealed envelope for you to review at your convenience later.  YOU SHOULD EXPECT: Some feelings of bloating in the abdomen. Passage of more gas than usual.  Walking can help get rid of the air that was put into your GI tract during the procedure and reduce the bloating. If you had a lower endoscopy (such as a colonoscopy or flexible sigmoidoscopy) you may notice spotting of blood in your stool or on the toilet paper. If you underwent a bowel prep for your procedure, you may not have a normal bowel movement for a few days.  Please Note:  You might notice some irritation and congestion in your nose or some drainage.  This is from the oxygen used during your procedure.  There is no need for concern and it should clear up in a day or so.  SYMPTOMS TO REPORT IMMEDIATELY:   Following lower endoscopy (colonoscopy or flexible sigmoidoscopy):  Excessive amounts of blood in the stool  Significant tenderness or worsening of abdominal pains  Swelling of the abdomen that is new, acute  Fever of 100F or higher   Following upper endoscopy (EGD)  Vomiting of blood or coffee ground material  New chest pain or pain under the shoulder blades  Painful or persistently difficult swallowing  New shortness of breath  Fever of 100F or higher  Black, tarry-looking stools  For urgent or emergent issues, a gastroenterologist can be reached at any hour by calling [phone].   DIET: Your first meal following the procedure should be a small meal and then it is ok to progress to  your normal diet. Heavy or fried foods are harder to digest and may make you feel nauseous or bloated.  Likewise, meals heavy in dairy and vegetables can increase bloating.  Drink plenty of fluids but you should avoid alcoholic beverages for 24 hours.  ACTIVITY:  You should plan to take it easy for the rest of today and you should NOT DRIVE or use heavy machinery until tomorrow (because of the sedation medicines used during the test).    FOLLOW UP: Our staff will call the number listed on your records the next business day following your procedure to check on you and address any questions or concerns that you may have regarding the information given to you following your procedure. If we do not reach you, we will leave a message.  However, if you are feeling well and you are not experiencing any problems, there is no need to return our call.  We will assume that you have returned to your regular daily activities without incident.  If any biopsies were taken you will be contacted by phone or by letter within the next 1-3 weeks.  Please call us at [phone] if you have not heard about the biopsies in 3 weeks.    SIGNATURES/CONFIDENTIALITY: You and/or your care partner have signed paperwork which will be entered into your electronic medical record.  These signatures attest to the fact that that the information above on your After Visit Summary has been reviewed   and is understood.  Full responsibility of the confidentiality of this discharge information lies with you and/or your care-partner. 

## 2015-07-09 ENCOUNTER — Telehealth: Payer: Self-pay | Admitting: *Deleted

## 2015-07-09 NOTE — Telephone Encounter (Signed)
  Follow up Call-  Call back number 07/08/2015  Post procedure Call Back phone  # [phone]  Permission to leave phone message Yes     Patient questions:  Do you have a fever, pain , or abdominal swelling? No. Pain Score  0 *  Have you tolerated food without any problems? Yes.    Have you been able to return to your normal activities? Yes.    Do you have any questions about your discharge instructions: Diet   No. Medications  No. Follow up visit  No.  Do you have questions or concerns about your Care? No.  Actions: * If pain score is 4 or above: No action needed, pain <4.  Information provided by husband.

## 2015-08-06 ENCOUNTER — Encounter: Payer: Medicare Other | Admitting: Gastroenterology

## 2015-08-11 LAB — HM MAMMOGRAPHY

## 2015-08-14 ENCOUNTER — Encounter: Payer: Self-pay | Admitting: Family Medicine

## 2015-09-26 ENCOUNTER — Ambulatory Visit (INDEPENDENT_AMBULATORY_CARE_PROVIDER_SITE_OTHER): Payer: Medicare Other | Admitting: Medical

## 2015-09-26 ENCOUNTER — Encounter: Payer: Self-pay | Admitting: Medical

## 2015-09-26 VITALS — BP 108/60 | HR 73 | Temp 97.7°F | Wt 187.0 lb

## 2015-09-26 DIAGNOSIS — J9801 Acute bronchospasm: Secondary | ICD-10-CM

## 2015-09-26 DIAGNOSIS — R059 Cough, unspecified: Secondary | ICD-10-CM

## 2015-09-26 DIAGNOSIS — R05 Cough: Secondary | ICD-10-CM

## 2015-09-26 MED ORDER — GUAIFENESIN-CODEINE 100-10 MG/5ML PO SYRP: 5.0000 mL | ORAL_SOLUTION | Freq: Three times a day (TID) | ORAL | Status: DC | PRN

## 2015-09-26 MED ORDER — ALBUTEROL SULFATE HFA 108 (90 BASE) MCG/ACT IN AERS: 2.0000 | INHALATION_SPRAY | Freq: Four times a day (QID) | RESPIRATORY_TRACT | Status: DC | PRN

## 2015-09-26 NOTE — Progress Notes (Signed)
Subjective:  Allison [name] is a 74 y.o. female who presents for possible bronchitis.  She reports feeling fine until yesterday started having lots of cough.  She denies fever, NVD, ear pain, belly or back pain, sinus pressure, chills, body aches.  In general she gets bronchitis every year, and when she has come in prior, if not treated right away, seems to get bronchitis lasting weeks.   Using cough drops, Tylenol.   No sick contacts.   Didn't want to go into weekend without being seen.  She does have old empty bottle of cough medication that worked well.  Denies GERD, wheezing, SOB, or hx/o asthma or lung disease.  No Chest pain, edema, palpations.   No other aggravating or relieving factors. No other complaint.  The following portions of the patient's history were reviewed and updated as appropriate: allergies, current medications, past family history, past medical history, past social history, past surgical history and problem list.  ROS as in subjective  Past Medical History  Diagnosis Date  . Allergy     RHINITIS  . Hypercholesterolemia   . Hypothyroid   . Tachycardia   . Former smoker   . Hypertension   . History of blood transfusion 1960    after devlivery of son  . Macular degeneration     bilateral  . GERD (gastroesophageal reflux disease)   . History of kidney stones     lithrotripsy  . Arthritis     lower back     Objective: BP 108/60 mmHg  Pulse 73  Temp(Src) 97.7 F (36.5 C) (Oral)  Wt 187 lb (84.823 kg)  SpO2 98%  General appearance: Alert, WD/WN, no distress                             Skin: warm, no rash, no diaphoresis                           Head: no sinus tenderness                            Eyes: conjunctiva normal, corneas clear, PERRLA                            Ears: pearly TMs, external ear canals normal                          Nose: septum midline, turbinates swollen, with erythema and clear discharge             Mouth/throat: MMM, tongue  normal, mild pharyngeal erythema and pnd                           Neck: supple, no adenopathy, no thyromegaly, nontender                        Lungs: clear, no rhonchi, no wheezes, no rales                Extremities: no edema, nontender     Assessment: Encounter Diagnoses  Name Primary?  . Cough Yes  . Bronchospasm      Plan:  Symptoms and exam suggest viral URI, cough.   No other cause of cough suspected.  Advised rest, hydration, can use medications prescribed below.   Advised she call back next week to give [place] symptom update.   Allison [name] was seen today for cough.  Diagnoses and all orders for this visit:  Cough  Bronchospasm  Other orders -     guaiFENesin-codeine (GUAIATUSSIN AC) 100-10 MG/5ML syrup; Take 5 mLs by mouth 3 (three) times daily as needed for cough. -     albuterol (PROVENTIL HFA;VENTOLIN HFA) 108 (90 BASE) MCG/ACT inhaler; Inhale 2 puffs into the lungs every 6 (six) hours as needed for wheezing or shortness of breath.

## 2015-10-07 ENCOUNTER — Encounter: Payer: Self-pay | Admitting: Family Medicine

## 2015-10-07 ENCOUNTER — Ambulatory Visit (INDEPENDENT_AMBULATORY_CARE_PROVIDER_SITE_OTHER): Payer: Medicare Other | Admitting: Family Medicine

## 2015-10-07 VITALS — BP 128/80 | HR 72 | Temp 97.7°F | Ht 66.0 in | Wt 183.0 lb

## 2015-10-07 DIAGNOSIS — J209 Acute bronchitis, unspecified: Secondary | ICD-10-CM | POA: Diagnosis not present

## 2015-10-07 MED ORDER — AMOXICILLIN-POT CLAVULANATE 875-125 MG PO TABS: 1.0000 | ORAL_TABLET | Freq: Two times a day (BID) | ORAL | Status: DC

## 2015-10-07 NOTE — Progress Notes (Signed)
   Subjective:    Patient ID: Allison [name], female    DOB: Jun 17, 1941, 74 y.o.   MRN: [id number]  HPI  she is here for evaluation of difficulty with cough, congestion, rhinorrhea, postnasal drainage noted to have started at the end of November. She was seen 2 days into these symptoms and treated symptomatically. The cough and congestion have gotten worse. No sore throat, earache , fever or chills.   Review of Systems     Objective:   Physical Exam Alert and in no distress. Tympanic membranes and canals are normal. Pharyngeal area is normal. Neck is supple without adenopathy or thyromegaly. Cardiac exam shows a regular sinus rhythm without murmurs or gallops. Lungs are clear to auscultation.        Assessment & Plan:  Acute bronchitis, unspecified organism - Plan: amoxicillin-clavulanate (AUGMENTIN) 875-125 MG tablet  he is to call if not entirely better when she finishes the antibiotic.

## 2015-10-07 NOTE — Patient Instructions (Signed)
If you not totally back to normal when you finish the antibiotic give me a call

## 2015-12-04 ENCOUNTER — Ambulatory Visit (INDEPENDENT_AMBULATORY_CARE_PROVIDER_SITE_OTHER): Payer: PPO | Admitting: Family Medicine

## 2015-12-04 ENCOUNTER — Encounter: Payer: Self-pay | Admitting: Family Medicine

## 2015-12-04 VITALS — BP 130/82 | HR 72 | Wt 184.0 lb

## 2015-12-04 DIAGNOSIS — Z23 Encounter for immunization: Secondary | ICD-10-CM | POA: Diagnosis not present

## 2015-12-04 DIAGNOSIS — M1990 Unspecified osteoarthritis, unspecified site: Secondary | ICD-10-CM

## 2015-12-04 DIAGNOSIS — Z79890 Hormone replacement therapy (postmenopausal): Secondary | ICD-10-CM

## 2015-12-04 DIAGNOSIS — K219 Gastro-esophageal reflux disease without esophagitis: Secondary | ICD-10-CM

## 2015-12-04 DIAGNOSIS — I10 Essential (primary) hypertension: Secondary | ICD-10-CM | POA: Diagnosis not present

## 2015-12-04 DIAGNOSIS — G479 Sleep disorder, unspecified: Secondary | ICD-10-CM | POA: Diagnosis not present

## 2015-12-04 DIAGNOSIS — E039 Hypothyroidism, unspecified: Secondary | ICD-10-CM

## 2015-12-04 DIAGNOSIS — Z87442 Personal history of urinary calculi: Secondary | ICD-10-CM

## 2015-12-04 DIAGNOSIS — E7800 Pure hypercholesterolemia, unspecified: Secondary | ICD-10-CM

## 2015-12-04 LAB — CBC WITH DIFFERENTIAL/PLATELET
BASOS ABS: 0.1 10*3/uL (ref 0.0–0.1)
Basophils Relative: 1 % (ref 0–1)
EOS ABS: 0.1 10*3/uL (ref 0.0–0.7)
EOS PCT: 2 % (ref 0–5)
HCT: 39 % (ref 36.0–46.0)
HEMOGLOBIN: 12.9 g/dL (ref 12.0–15.0)
LYMPHS ABS: 1.7 10*3/uL (ref 0.7–4.0)
Lymphocytes Relative: 26 % (ref 12–46)
MCH: 31.3 pg (ref 26.0–34.0)
MCHC: 33.1 g/dL (ref 30.0–36.0)
MCV: 94.7 fL (ref 78.0–100.0)
MONO ABS: 0.6 10*3/uL (ref 0.1–1.0)
MONOS PCT: 9 % (ref 3–12)
MPV: 10.7 fL (ref 8.6–12.4)
NEUTROS PCT: 62 % (ref 43–77)
Neutro Abs: 4.2 10*3/uL (ref 1.7–7.7)
Platelets: 284 10*3/uL (ref 150–400)
RBC: 4.12 MIL/uL (ref 3.87–5.11)
RDW: 13.8 % (ref 11.5–15.5)
WBC: 6.7 10*3/uL (ref 4.0–10.5)

## 2015-12-04 LAB — COMPREHENSIVE METABOLIC PANEL
ALBUMIN: 4.2 g/dL (ref 3.6–5.1)
ALK PHOS: 42 U/L (ref 33–130)
ALT: 14 U/L (ref 6–29)
AST: 23 U/L (ref 10–35)
BILIRUBIN TOTAL: 0.6 mg/dL (ref 0.2–1.2)
BUN: 15 mg/dL (ref 7–25)
CHLORIDE: 99 mmol/L (ref 98–110)
CO2: 27 mmol/L (ref 20–31)
CREATININE: 1.06 mg/dL — AB (ref 0.60–0.93)
Calcium: 10.3 mg/dL (ref 8.6–10.4)
Glucose, Bld: 109 mg/dL — ABNORMAL HIGH (ref 65–99)
Potassium: 4.6 mmol/L (ref 3.5–5.3)
SODIUM: 138 mmol/L (ref 135–146)
TOTAL PROTEIN: 7.3 g/dL (ref 6.1–8.1)

## 2015-12-04 LAB — LIPID PANEL
CHOLESTEROL: 297 mg/dL — AB (ref 125–200)
HDL: 55 mg/dL (ref >=46)
LDL Cholesterol: 209 mg/dL — ABNORMAL HIGH (ref <130)
TRIGLYCERIDES: 163 mg/dL — AB (ref <150)
Total CHOL/HDL Ratio: 5.4 Ratio — ABNORMAL HIGH (ref <=5.0)
VLDL: 33 mg/dL — AB (ref <30)

## 2015-12-04 LAB — TSH: TSH: 7.28 mIU/L — ABNORMAL HIGH

## 2015-12-04 MED ORDER — ZOLPIDEM TARTRATE 5 MG PO TABS: 5.0000 mg | ORAL_TABLET | Freq: Every evening | ORAL | Status: DC | PRN

## 2015-12-04 MED ORDER — LOSARTAN POTASSIUM-HCTZ 100-25 MG PO TABS: 1.0000 | ORAL_TABLET | Freq: Every day | ORAL | Status: DC

## 2015-12-04 NOTE — Progress Notes (Signed)
Patient ID: Allison [name], female   DOB: 02-23-1941, 75 y.o.   MRN: [id number] Allison [name] is a 75 y.o. female who presents for annual wellness visit and follow-up on chronic medical conditions.  She has the following concerns: She does have hyperlipidemia however stopped taking her Zocor approximately 3 months ago. She would like to see whether she still needs it. She does have underlying reflux disease however is not using Prilosec very often. She can usually takesTumsTwice per week usually at night and does quite well on this. She does plan on stopping her estrogen in the near future. She does have underlying arthritis mainly in her back and has very little difficulty with this. She has remote history of renal stones. Presently she is also taking Synthroid 150 g. Apparently this was started by a physician in [place]. She occasionally uses Ambien.  Immunization History  Administered Date(s) Administered  . DTaP 05/13/1987  . HPV Quadrivalent 04/30/1955, 06/01/1955, 01/14/1956  . Influenza Split 08/09/2015  . Influenza Whole 08/17/2007  . Pneumococcal Polysaccharide-23 08/17/2007, 10/21/2014   Last Pap smear: Unknown Last mammogram: Last Fall Last colonoscopy: Last Fall Last DEXA: Dentist: Dr Sherryle Lis Ophtho: Can't remember name Exercise: 2 1/2 acres yard work. This usually keeps her quite busy and physically active. Other doctors caring for patient include: She sees a physician in [place] when she is living there.  Depression screen:  See questionnaire below.  Depression screen [facility] 2/9 12/04/2015 05/02/2014  Decreased Interest 0 3  Down, Depressed, Hopeless 0 3  PHQ - 2 Score 0 6    Fall Risk Screen: see questionnaire below. Fall Risk  12/04/2015 05/02/2014  Falls in the past year? No No    ADL screen:  See questionnaire below Functional Status Survey: Is the patient deaf or have difficulty hearing?: No Does the patient have difficulty seeing, even when wearing glasses/contacts?:  No Does the patient have difficulty concentrating, remembering, or making decisions?: No Does the patient have difficulty walking or climbing stairs?: No Does the patient have difficulty dressing or bathing?: No Does the patient have difficulty doing errands alone such as visiting a doctor's office or shopping?: No   End of Life Discussion:  Patient does not have a living will and medical power of attorney. Have discussed this with her. She does plan on having a discussion with her family members had a strongly encouraged her to get a living will. Review of Systems Constitutional: -fever, -chills, -sweats, -unexpected weight change, -anorexia, -fatigue Allergy: -sneezing, -itching, -congestion Dermatology: denies changing moles, rash, lumps, new worrisome lesions  Cardiology:  -chest pain, -palpitations, -edema, -orthopnea, -paroxysmal nocturnal dyspnea Respiratory: -cough, -shortness of breath, -dyspnea on exertion, -wheezing, -hemoptysis Gastroenterology: -abdominal pain, -nausea, -vomiting, -diarrhea, -constipation, -blood in stool, -changes in bowel movement, -dysphagia Hematology: -bleeding or bruising problems Musculoskeletal: -arthralgias, -myalgias, -joint swelling, -back pain, -neck pain, -cramping, -gait changes Ophthalmology: -vision changes, -eye redness, -itching, -discharge Urology: -dysuria, -difficulty urinating, -hematuria, -urinary frequency, -urgency, incontinence Neurology: -headache, -weakness, -tingling, -numbness, -speech abnormality, -memory loss, -falls, -dizziness Psychology:  -depressed mood, -agitation, -sleep problems    PHYSICAL EXAM:  BP 130/82 mmHg  Pulse 72  Wt 184 lb (83.462 kg)  SpO2 96%  General Appearance: Alert, cooperative, no distress, appears stated age Head: Normocephalic, without obvious abnormality, atraumatic Eyes: PERRL, conjunctiva/corneas clear, EOM's intact, fundi benign Ears: Normal TM's and external ear canals Nose: Nares normal,  mucosa normal, no drainage or sinus   tenderness Throat: Lips, mucosa, and tongue normal; teeth  and gums normal Neck: Supple, no lymphadenopathy; thyroid: no enlargement/tenderness/nodules; no carotid bruit or JVD  Lungs: Clear to auscultation bilaterally without wheezes, rales or ronchi; respirations unlabored Chest Wall: No tenderness or deformity Heart: Regular rate and rhythm, S1 and S2 normal, no murmur, rub or gallop Abdomen: Soft, non-tender, nondistended, normoactive bowel sounds, no masses, no hepatosplenomegaly Extremities: No clubbing, cyanosis or edema Pulses: 2+ and symmetric all extremities Skin: Skin color, texture, turgor normal, no rashes or lesions Lymph nodes: Cervical, supraclavicular, and axillary nodes normal Neurologic: CNII-XII intact, normal strength, sensation and gait; reflexes 2+ and symmetric throughout Psych: Normal mood, affect, hygiene and grooming.  ASSESSMENT/PLAN: Gastroesophageal reflux disease without esophagitis  HYPERCHOLESTEROLEMIA - Plan: Lipid panel  Essential hypertension - Plan: CBC with Differential/Platelet, Comprehensive metabolic panel  MURMUR  Hypothyroidism, unspecified hypothyroidism type - Plan: TSH  Arthritis  History of kidney stones  Postmenopausal HRT (hormone replacement therapy) - Plan: CBC with Differential/Platelet, Comprehensive metabolic panel  Need for prophylactic vaccination against Streptococcus pneumoniae (pneumococcus) - Plan: Pneumococcal conjugate vaccine 13-valent    Discussed monthly self breast exams and yearly mammograms; at least 30 minutes of aerobic activity at least 5 days/week and weight-bearing exercise 2x/week; proper sunscreen use reviewed; healthy diet, including goals of calcium and vitamin D intake and alcohol recommendations (less than or equal to 1 drink/day) reviewed; regular seatbelt use; changing batteries in smoke detectors.  Immunization recommendations discussed.  Colonoscopy  recommendations reviewed   Medicare Attestation I have personally reviewed: The patient's medical and social history Their use of alcohol, tobacco or illicit drugs Their current medications and supplements The patient's functional ability including ADLs,fall risks, home safety risks, cognitive, and hearing and visual impairment Diet and physical activities Evidence for depression or mood disorders  The patient's weight, height, and BMI have been recorded in the chart.  I have made referrals, counseling, and provided education to the patient based on review of the above and I have provided the patient with a written personalized care plan for preventive services.   Prescription written for her to get TDaP and Zostavax.Encouraged her to continue to get physically active. We will reconsider her lipids and PPI treatment. Her Ambien will be renewed. She does occasionally use this for sleep.  Wyatt Haste, MD   12/04/2015

## 2015-12-04 NOTE — Patient Instructions (Signed)
  Ms. Maddaloni , Thank you for taking time to come for your Medicare Wellness Visit. I appreciate your ongoing commitment to your health goals. Please review the following plan we discussed and let me know if I can assist you in the future.   These are the goals we discussed: Taking good care of yourself  This is a list of the screening recommended for you and due dates:  Health Maintenance  Topic Date Due  . Tetanus Vaccine  11/25/1958  . Shingles Vaccine  11/26/1999  . Pneumonia vaccines (2 of 2 - PCV13) 10/22/2015  . Flu Shot  05/25/2016  . Colon Cancer Screening  07/07/2020  . DEXA scan (bone density measurement)  Completed  See if you can get your tetanus and shingles vaccines done. Let me know how you're doing on the palms and how you do want to get off the estrogen.

## 2015-12-05 MED ORDER — SIMVASTATIN 40 MG PO TABS: 40.0000 mg | ORAL_TABLET | Freq: Every day | ORAL | Status: DC

## 2015-12-05 MED ORDER — LEVOTHYROXINE SODIUM 150 MCG PO TABS: 150.0000 ug | ORAL_TABLET | Freq: Every day | ORAL | Status: DC

## 2015-12-05 MED ORDER — LOSARTAN POTASSIUM-HCTZ 100-25 MG PO TABS: 1.0000 | ORAL_TABLET | Freq: Every day | ORAL | Status: DC

## 2015-12-05 NOTE — Addendum Note (Signed)
Addended by: Denita Lung on: 12/05/2015 01:04 PM   Modules accepted: Orders

## 2015-12-05 NOTE — Addendum Note (Signed)
Addended by: Denita Lung on: 12/05/2015 08:26 AM   Modules accepted: Orders

## 2016-02-04 ENCOUNTER — Encounter: Payer: Self-pay | Admitting: Family Medicine

## 2016-02-04 ENCOUNTER — Ambulatory Visit (INDEPENDENT_AMBULATORY_CARE_PROVIDER_SITE_OTHER): Payer: PPO | Admitting: Family Medicine

## 2016-02-04 VITALS — BP 110/70 | HR 87 | Ht 66.0 in | Wt 176.0 lb

## 2016-02-04 DIAGNOSIS — E7800 Pure hypercholesterolemia, unspecified: Secondary | ICD-10-CM | POA: Diagnosis not present

## 2016-02-04 DIAGNOSIS — R7309 Other abnormal glucose: Secondary | ICD-10-CM

## 2016-02-04 DIAGNOSIS — E039 Hypothyroidism, unspecified: Secondary | ICD-10-CM | POA: Diagnosis not present

## 2016-02-04 LAB — LIPID PANEL
CHOL/HDL RATIO: 2.9 ratio (ref <=5.0)
Cholesterol: 132 mg/dL (ref 125–200)
HDL: 45 mg/dL — AB (ref >=46)
LDL CALC: 60 mg/dL (ref <130)
Triglycerides: 135 mg/dL (ref <150)
VLDL: 27 mg/dL (ref <30)

## 2016-02-04 LAB — POCT GLYCOSYLATED HEMOGLOBIN (HGB A1C): HEMOGLOBIN A1C: 5.6

## 2016-02-04 NOTE — Progress Notes (Signed)
   Subjective:    Patient ID: Allison [name], female    DOB: 10-May-1941, 75 y.o.   MRN: [id number]  HPI She returns for recheck. She is now taking Zocor 40 mg twice per day. She apparently couldn't tolerate a full 80 mg dosing. She also is taking her thyroid medication as prescribed on an empty stomach. The record also indicates a slightly elevated blood sugar. She does have a family history of diabetes.   Review of Systems     Objective:   Physical Exam Alert and in no distress otherwise not examined Hemoglobin A1c is 5.6.     Assessment & Plan:  HYPERCHOLESTEROLEMIA - Plan: Lipid panel  Hypothyroidism, unspecified hypothyroidism type - Plan: TSH  Elevated glucose - Plan: HgB A1c plans at this time there is no evidence of diabetes. We discussed her risk factors for this.

## 2016-02-05 LAB — TSH: TSH: 0.02 m[IU]/L — AB

## 2016-02-05 MED ORDER — OMEPRAZOLE 20 MG PO CPDR: 20.0000 mg | DELAYED_RELEASE_CAPSULE | Freq: Every day | ORAL | Status: DC

## 2016-02-05 MED ORDER — LEVOTHYROXINE SODIUM 125 MCG PO TABS: 125.0000 ug | ORAL_TABLET | Freq: Every day | ORAL | Status: DC

## 2016-02-05 NOTE — Addendum Note (Signed)
Addended by: Denita Lung on: 02/05/2016 08:51 AM   Modules accepted: Orders

## 2016-02-05 NOTE — Progress Notes (Signed)
   Subjective:    Patient ID: Allison [name], female    DOB: [birth year], 75 y.o.   MRN: [id number]  HPI    Review of Systems     Objective:   Physical Exam        Assessment & Plan:  Her most recent TSH was too low. She presently is using [employer]. I will decrease her dosing 2.125 and recheck in 2 months. She also wants her Prilosec renewed. My thought is that she could possibly not been taking it correctly before or was given a a pill from a different pharmaceutical supplier.

## 2016-02-17 ENCOUNTER — Other Ambulatory Visit: Payer: Self-pay

## 2016-02-17 ENCOUNTER — Telehealth: Payer: Self-pay | Admitting: Family Medicine

## 2016-02-17 DIAGNOSIS — E7800 Pure hypercholesterolemia, unspecified: Secondary | ICD-10-CM

## 2016-02-17 MED ORDER — SIMVASTATIN 40 MG PO TABS: 40.0000 mg | ORAL_TABLET | Freq: Every day | ORAL | Status: DC

## 2016-02-17 NOTE — Telephone Encounter (Signed)
Pt states that Simvastatin 40 mg  Script should have gone to [employer]

## 2016-04-22 ENCOUNTER — Other Ambulatory Visit: Payer: PPO

## 2016-04-22 ENCOUNTER — Telehealth: Payer: Self-pay

## 2016-04-22 ENCOUNTER — Other Ambulatory Visit: Payer: Self-pay | Admitting: Family Medicine

## 2016-04-22 DIAGNOSIS — E039 Hypothyroidism, unspecified: Secondary | ICD-10-CM

## 2016-04-22 DIAGNOSIS — E7800 Pure hypercholesterolemia, unspecified: Secondary | ICD-10-CM | POA: Diagnosis not present

## 2016-04-22 LAB — TSH: TSH: 0.02 m[IU]/L — AB

## 2016-04-22 NOTE — Telephone Encounter (Signed)
yes

## 2016-04-22 NOTE — Telephone Encounter (Signed)
Patient came in today for lab TSH and she told JO that you told her you were going to recheck her lipids also you had to reduce her zocor from 80 to 40 so if you want this done please let me know

## 2016-04-23 LAB — LIPID PANEL
CHOL/HDL RATIO: 2.6 ratio (ref <=5.0)
Cholesterol: 140 mg/dL (ref 125–200)
HDL: 53 mg/dL (ref >=46)
LDL Cholesterol: 61 mg/dL (ref <130)
Triglycerides: 132 mg/dL (ref <150)
VLDL: 26 mg/dL (ref <30)

## 2016-04-23 NOTE — Telephone Encounter (Signed)
Spoke with Allison [name]- asked her to add on lipid panel on pt. Allison [name]

## 2016-04-28 ENCOUNTER — Telehealth: Payer: Self-pay | Admitting: Family Medicine

## 2016-04-28 MED ORDER — LEVOTHYROXINE SODIUM 112 MCG PO TABS: 112.0000 ug | ORAL_TABLET | Freq: Every day | ORAL | Status: DC

## 2016-04-28 NOTE — Telephone Encounter (Signed)
Pt called and stated you could reach her at [phone] after 4.

## 2016-04-28 NOTE — Telephone Encounter (Signed)
The last 2 TSH values were low at 0.02 I explained this to her. I will decrease her Synthroid to 0.112 and recheck in 2 months.

## 2016-04-28 NOTE — Telephone Encounter (Signed)
I just talked to her tear. Make sure that she is scheduled for recheck in 2 months

## 2016-06-30 ENCOUNTER — Ambulatory Visit: Payer: Self-pay | Admitting: Family Medicine

## 2016-07-23 ENCOUNTER — Encounter: Payer: Self-pay | Admitting: Family Medicine

## 2016-07-23 ENCOUNTER — Ambulatory Visit (INDEPENDENT_AMBULATORY_CARE_PROVIDER_SITE_OTHER): Payer: PPO | Admitting: Family Medicine

## 2016-07-23 VITALS — BP 122/80 | HR 76 | Ht 66.0 in | Wt 172.0 lb

## 2016-07-23 DIAGNOSIS — Z23 Encounter for immunization: Secondary | ICD-10-CM | POA: Diagnosis not present

## 2016-07-23 DIAGNOSIS — E039 Hypothyroidism, unspecified: Secondary | ICD-10-CM | POA: Diagnosis not present

## 2016-07-23 LAB — T3, FREE: T3, Free: 3.1 pg/mL (ref 2.3–4.2)

## 2016-07-23 LAB — T4, FREE: Free T4: 2 ng/dL — ABNORMAL HIGH (ref 0.8–1.8)

## 2016-07-23 LAB — TSH: TSH: 0.03 mIU/L — ABNORMAL LOW

## 2016-07-23 NOTE — Progress Notes (Signed)
   Subjective:    Patient ID: Allison [name], female    DOB: 1941/03/31, 75 y.o.   MRN: [id number]  HPI She is here for a recheck. She has concerns over her thyroid function. She did have an elevated TSH and my concern at that point was with proper absorption. The next TSH was quite low. I then proceeded to lower her however the last reading was still abnormal. Presently she is on 0.112 milligrams daily. She does complain of hair loss.   Review of Systems     Objective:   Physical Exam Alert and in no distress otherwise not examined       Assessment & Plan:  Hypothyroidism, unspecified hypothyroidism type - Plan: TSH, T3, Free, T4, free  Need for prophylactic vaccination and inoculation against influenza - Plan: Flu vaccine HIGH DOSE PF (Fluzone High dose) I attempted to explain the feedback mechanism in terms of high TSH indicating low thyroid function and vice versa. I am not sure she fully understood the concept. I will do a more thorough thyroid function evaluation at her request. Also flu shot given.

## 2016-07-26 ENCOUNTER — Other Ambulatory Visit: Payer: Self-pay | Admitting: Family Medicine

## 2016-07-26 DIAGNOSIS — E039 Hypothyroidism, unspecified: Secondary | ICD-10-CM

## 2016-07-26 MED ORDER — LEVOTHYROXINE SODIUM 100 MCG PO TABS: 100.0000 ug | ORAL_TABLET | Freq: Every day | ORAL | 0 refills | Status: DC

## 2016-07-26 NOTE — Progress Notes (Signed)
Her TSH is still not at goal. I will readjust her thyroid medicine and recheck in 2 months

## 2016-08-16 ENCOUNTER — Telehealth: Payer: Self-pay | Admitting: Family Medicine

## 2016-08-16 NOTE — Telephone Encounter (Signed)
Go ahead and send her and send all the most recent notes and lab data

## 2016-08-16 NOTE — Telephone Encounter (Signed)
Pt called and states that she would like a referral to the endocrinologist Dr Chalmers Cater at [facility] states she keeps loosing her hair and that she has been having problems with her thyroid. And would like to go there to see whats going on, pt can be reached at [phone] with any questions

## 2016-08-18 DIAGNOSIS — Z1231 Encounter for screening mammogram for malignant neoplasm of breast: Secondary | ICD-10-CM | POA: Diagnosis not present

## 2016-08-18 DIAGNOSIS — Z803 Family history of malignant neoplasm of breast: Secondary | ICD-10-CM | POA: Diagnosis not present

## 2016-08-18 DIAGNOSIS — M8589 Other specified disorders of bone density and structure, multiple sites: Secondary | ICD-10-CM | POA: Diagnosis not present

## 2016-08-18 LAB — HM MAMMOGRAPHY

## 2016-08-18 LAB — HM DEXA SCAN

## 2016-08-18 NOTE — Telephone Encounter (Signed)
I have faxed referral to [facility]

## 2016-08-23 ENCOUNTER — Telehealth: Payer: Self-pay

## 2016-08-23 NOTE — Telephone Encounter (Signed)
Called to inform pt of appointment with Dr.Balan 09-22-16 at 3pm pt is aware

## 2016-08-30 DIAGNOSIS — I10 Essential (primary) hypertension: Secondary | ICD-10-CM | POA: Diagnosis not present

## 2016-08-30 DIAGNOSIS — L659 Nonscarring hair loss, unspecified: Secondary | ICD-10-CM | POA: Diagnosis not present

## 2016-08-30 DIAGNOSIS — E039 Hypothyroidism, unspecified: Secondary | ICD-10-CM | POA: Diagnosis not present

## 2016-09-07 ENCOUNTER — Encounter: Payer: Self-pay | Admitting: Family Medicine

## 2016-10-11 DIAGNOSIS — E039 Hypothyroidism, unspecified: Secondary | ICD-10-CM | POA: Diagnosis not present

## 2016-10-29 ENCOUNTER — Ambulatory Visit (INDEPENDENT_AMBULATORY_CARE_PROVIDER_SITE_OTHER): Payer: PPO | Admitting: Family Medicine

## 2016-10-29 ENCOUNTER — Encounter: Payer: Self-pay | Admitting: Family Medicine

## 2016-10-29 VITALS — BP 130/70 | HR 71 | Temp 97.6°F | Wt 172.4 lb

## 2016-10-29 DIAGNOSIS — M545 Low back pain, unspecified: Secondary | ICD-10-CM

## 2016-10-29 DIAGNOSIS — R3915 Urgency of urination: Secondary | ICD-10-CM

## 2016-10-29 LAB — POCT URINALYSIS DIPSTICK
Bilirubin, UA: NEGATIVE
Blood, UA: NEGATIVE
Glucose, UA: NEGATIVE
Ketones, UA: NEGATIVE
Nitrite, UA: NEGATIVE
PH UA: 6
Protein, UA: NEGATIVE
Spec Grav, UA: 1.025
Urobilinogen, UA: NEGATIVE

## 2016-10-29 NOTE — Patient Instructions (Signed)
You can try Aleve and heat to your low back and do the stretches that we discussed.  You do not appear to have a UTI but drink some extra water and you can try over the counter AZO for 1-2 days and see if this helps.  You can call if your symptoms get worse.

## 2016-10-29 NOTE — Progress Notes (Signed)
Subjective: Chief Complaint  Patient presents with  . UTI    low back pain and urgency for the last couple day     Allison [name] is a 76 y.o. female who complains of possible urinary tract infection.  She has had symptoms for 3 days.  Symptoms include low back pain and urinary urgency and frequency . Patient denies fever, chills, dysuria, abdominal pain, N/V/D, vaginal discharge.  Last UTI was 4-5 years ago.   Using Aleve for current symptoms.   States she had burning with urination with her previous UTIs and does not now.  Low back pain is mainly left sided, non radiating and worse with movement and improved with rest.  Denies numbness, tingling or weakness of extremities. No saddle anesthesia or loss of control of bladder or bowels.   Patient does not have a history of recurrent UTI. Patient does not have a history of pyelonephritis.  No other aggravating or relieving factors.  No other c/o.  Past Medical History:  Diagnosis Date  . Allergy    RHINITIS  . Arthritis    lower back  . Former smoker   . GERD (gastroesophageal reflux disease)   . History of blood transfusion 1960   after devlivery of son  . History of kidney stones    lithrotripsy  . Hypercholesterolemia   . Hypertension   . Hypothyroid   . Macular degeneration    bilateral  . Tachycardia     ROS as in subjective  Reviewed allergies, medications, past medical, surgical, and social history.    Objective: Vitals:   10/29/16 0920  BP: 130/70  Pulse: 71  Temp: 97.6 F (36.4 C)    General appearance: alert, no distress, WD/WN, female Abdomen: +bs, soft, non tender, non distended, no masses, no hepatomegaly, no splenomegaly, no bruits Back: no CVA tenderness. Normal lumbar curve and ROM, non tender. Negative straight leg raise. DTRs normal.  GU: declined Ext: normal sensation and pulses.      Laboratory:  Urine dipstick: 2+ for leukocyte esterase.       Assessment: Urgency of urination - Plan:  Urinalysis Dipstick  Acute left-sided low back pain without sciatica    Plan: Discussed that her urine does not show a UTI and that her low back pain appears to be related to a MSK etiology. Pain worse with movement speaks to this. Recommend conservative treatment at this time.   She may try Aleve and heat to low back.  Advised increased water intake, can use OTC Tylenol for pain.    Advised to try AZO for urinary symptoms for 1-2 days if she desires and see if this helps.   Urine culture was not sent.    Call or return if worse or not improving.

## 2016-11-01 DIAGNOSIS — H353131 Nonexudative age-related macular degeneration, bilateral, early dry stage: Secondary | ICD-10-CM | POA: Diagnosis not present

## 2016-11-01 DIAGNOSIS — H35033 Hypertensive retinopathy, bilateral: Secondary | ICD-10-CM | POA: Diagnosis not present

## 2016-11-01 DIAGNOSIS — H43811 Vitreous degeneration, right eye: Secondary | ICD-10-CM | POA: Diagnosis not present

## 2016-11-01 DIAGNOSIS — Z961 Presence of intraocular lens: Secondary | ICD-10-CM | POA: Diagnosis not present

## 2016-11-01 DIAGNOSIS — H353130 Nonexudative age-related macular degeneration, bilateral, stage unspecified: Secondary | ICD-10-CM | POA: Diagnosis not present

## 2016-12-09 ENCOUNTER — Ambulatory Visit (INDEPENDENT_AMBULATORY_CARE_PROVIDER_SITE_OTHER): Payer: PPO | Admitting: Family Medicine

## 2016-12-09 ENCOUNTER — Other Ambulatory Visit: Payer: Self-pay

## 2016-12-09 ENCOUNTER — Other Ambulatory Visit: Payer: Self-pay | Admitting: Family Medicine

## 2016-12-09 ENCOUNTER — Encounter: Payer: Self-pay | Admitting: Family Medicine

## 2016-12-09 VITALS — BP 140/70 | HR 77 | Ht 66.0 in | Wt 174.8 lb

## 2016-12-09 DIAGNOSIS — K219 Gastro-esophageal reflux disease without esophagitis: Secondary | ICD-10-CM | POA: Diagnosis not present

## 2016-12-09 DIAGNOSIS — M1990 Unspecified osteoarthritis, unspecified site: Secondary | ICD-10-CM | POA: Diagnosis not present

## 2016-12-09 DIAGNOSIS — Z87442 Personal history of urinary calculi: Secondary | ICD-10-CM

## 2016-12-09 DIAGNOSIS — G479 Sleep disorder, unspecified: Secondary | ICD-10-CM

## 2016-12-09 DIAGNOSIS — E7800 Pure hypercholesterolemia, unspecified: Secondary | ICD-10-CM

## 2016-12-09 DIAGNOSIS — I10 Essential (primary) hypertension: Secondary | ICD-10-CM | POA: Diagnosis not present

## 2016-12-09 LAB — CBC WITH DIFFERENTIAL/PLATELET
BASOS PCT: 1 %
Basophils Absolute: 73 cells/uL (ref 0–200)
EOS ABS: 292 {cells}/uL (ref 15–500)
EOS PCT: 4 %
HCT: 37.6 % (ref 35.0–45.0)
HEMOGLOBIN: 12.1 g/dL (ref 11.7–15.5)
LYMPHS ABS: 1679 {cells}/uL (ref 850–3900)
Lymphocytes Relative: 23 %
MCH: 30 pg (ref 27.0–33.0)
MCHC: 32.2 g/dL (ref 32.0–36.0)
MCV: 93.3 fL (ref 80.0–100.0)
MONOS PCT: 8 %
MPV: 11.5 fL (ref 7.5–12.5)
Monocytes Absolute: 584 cells/uL (ref 200–950)
NEUTROS ABS: 4672 {cells}/uL (ref 1500–7800)
Neutrophils Relative %: 64 %
PLATELETS: 227 10*3/uL (ref 140–400)
RBC: 4.03 MIL/uL (ref 3.80–5.10)
RDW: 13.4 % (ref 11.0–15.0)
WBC: 7.3 10*3/uL (ref 4.0–10.5)

## 2016-12-09 LAB — COMPREHENSIVE METABOLIC PANEL
ALBUMIN: 4.2 g/dL (ref 3.6–5.1)
ALT: 11 U/L (ref 6–29)
AST: 24 U/L (ref 10–35)
Alkaline Phosphatase: 35 U/L (ref 33–130)
BUN: 21 mg/dL (ref 7–25)
CALCIUM: 9.7 mg/dL (ref 8.6–10.4)
CHLORIDE: 104 mmol/L (ref 98–110)
CO2: 26 mmol/L (ref 20–31)
CREATININE: 1.09 mg/dL — AB (ref 0.60–0.93)
Glucose, Bld: 90 mg/dL (ref 65–99)
POTASSIUM: 4.3 mmol/L (ref 3.5–5.3)
Sodium: 140 mmol/L (ref 135–146)
Total Bilirubin: 0.6 mg/dL (ref 0.2–1.2)
Total Protein: 7.2 g/dL (ref 6.1–8.1)

## 2016-12-09 LAB — LIPID PANEL
CHOL/HDL RATIO: 2.9 ratio (ref <5.0)
Cholesterol: 155 mg/dL (ref <200)
HDL: 53 mg/dL (ref >50)
LDL CALC: 78 mg/dL (ref <100)
TRIGLYCERIDES: 120 mg/dL (ref <150)
VLDL: 24 mg/dL (ref <30)

## 2016-12-09 MED ORDER — ZOLPIDEM TARTRATE 5 MG PO TABS: 5.0000 mg | ORAL_TABLET | Freq: Every evening | ORAL | 0 refills | Status: DC | PRN

## 2016-12-09 MED ORDER — LOSARTAN POTASSIUM-HCTZ 100-25 MG PO TABS: 1.0000 | ORAL_TABLET | Freq: Every day | ORAL | 3 refills | Status: DC

## 2016-12-09 MED ORDER — SIMVASTATIN 40 MG PO TABS: 40.0000 mg | ORAL_TABLET | Freq: Every day | ORAL | 3 refills | Status: DC

## 2016-12-09 NOTE — Patient Instructions (Signed)
Make sure you do stretching exercises for your back and do Tylenol first and then use Aleve which can be 2 pills twice per day

## 2016-12-09 NOTE — Progress Notes (Signed)
Subjective:   HPI  Allison [name] is a 76 y.o. female who presents for a complete physical.  Medical care team includes:  [facility]   Preventative care: Last ophthalmology visit: 11/01/16 Last dental visit:02/2016 Last colonoscopy:07/08/15 Last mammogram: 08/18/16 Last gynecological exam: Last EKG:08/08/09 Last labs:04/22/16  Prior vaccinations: TD or Tdap:12/04/15 Influenza:07/23/16 Pneumococcal:23: 11/21/13 13: 12/04/15 Shingles/Zostavax: 12/04/15  Advanced directive: Yes. Copy asked for. Concerns: He does have underlying hypothyroidism and is seeing endocrine. She seems to be fairly stable on 88 g. She will need follow-up TSH in approximately 2 months. She continues on simvastatin and is having no difficulty with that. She also is taking Prilosec for reflux symptoms and losartan/HCTZ for her blood pressure. He does occasionally use zolpidem and would like refill on that. She is using them usually once at max twice per week. She does have arthritis symptoms and is using OTC meds for that. She has a remote history of kidney stones.  Reviewed their medical, surgical, family, social, medication, and allergy history and updated chart as appropriate.    Review of Systems Otherwise negative except as above   Objective:   Physical Exam  General appearance: alert, no distress, WD/WN,  Skin: Normal HEENT: normocephalic, conjunctiva/corneas normal, sclerae anicteric, PERRLA, EOMi, nares patent, no discharge or erythema, pharynx normal Oral cavity: MMM, tongue normal, teeth normal Neck: supple, no lymphadenopathy, no thyromegaly, no masses, normal ROM Chest: non tender, normal shape and expansion Heart: RRR, normal S1, S2, no murmurs Lungs: CTA bilaterally, no wheezes, rhonchi, or rales Abdomen: +bs, soft, non tender, non distended, no masses, no hepatomegaly, no splenomegaly, no bruits  Musculoskeletal: upper extremities non tender, no obvious deformity, normal ROM throughout,  lower extremities non tender, no obvious deformity, normal ROM throughout Extremities: no edema, no cyanosis, no clubbing Pulses: 2+ symmetric, upper and lower extremities, normal cap refill Neurological: alert, oriented x 3, CN2-12 intact, strength normal upper extremities and lower extremities, sensation normal throughout, DTRs 2+ throughout, no cerebellar signs, gait normal Psychiatric: normal affect, behavior normal, pleasant      Assessment and Plan :   HYPERCHOLESTEROLEMIA - Plan: Lipid panel, simvastatin (ZOCOR) 40 MG tablet  Gastroesophageal reflux disease without esophagitis  Essential hypertension - Plan: CBC with Differential/Platelet, Comprehensive metabolic panel, losartan-hydrochlorothiazide (HYZAAR) 100-25 MG tablet  Arthritis  History of kidney stones - Plan: CBC with Differential/Platelet, Comprehensive metabolic panel  Sleep disturbance - Plan: zolpidem (AMBIEN) 5 MG tablet Did recommend using the Prilosec on an as-needed basis she will come back here in 2 months for a TSH. Her Ambien was renewed and I again cautioned her on the use of this. So encouraged her to use Tylenol for arthritic pain relief and then try Aleve 2 twice per day.  Physical exam - discussed healthy lifestyle, diet, exercise, preventative care, vaccinations, and addressed their concerns.  Handout given. Follow-up as needed

## 2016-12-09 NOTE — Telephone Encounter (Signed)
Called in [place] per [employer]

## 2016-12-13 ENCOUNTER — Encounter: Payer: Self-pay | Admitting: Family Medicine

## 2016-12-13 LAB — T3: T3, Total: 80 ng/dL (ref 76–181)

## 2016-12-13 LAB — TSH: TSH: 3.86 m[IU]/L

## 2016-12-13 LAB — T4: T4, Total: 10 ug/dL (ref 4.5–12.0)

## 2016-12-14 MED ORDER — LEVOTHYROXINE SODIUM 88 MCG PO TABS: 88.0000 ug | ORAL_TABLET | Freq: Every day | ORAL | 3 refills | Status: DC

## 2016-12-14 NOTE — Addendum Note (Signed)
Addended by: Denita Lung on: 12/14/2016 08:07 AM   Modules accepted: Orders

## 2016-12-26 ENCOUNTER — Telehealth: Payer: Self-pay | Admitting: Family Medicine

## 2016-12-26 NOTE — Telephone Encounter (Signed)
P.A. ZOLPIDEM °

## 2016-12-27 ENCOUNTER — Other Ambulatory Visit: Payer: Self-pay

## 2016-12-27 MED ORDER — LEVOTHYROXINE SODIUM 88 MCG PO TABS: 88.0000 ug | ORAL_TABLET | Freq: Every day | ORAL | 3 refills | Status: DC

## 2016-12-30 ENCOUNTER — Ambulatory Visit (INDEPENDENT_AMBULATORY_CARE_PROVIDER_SITE_OTHER): Payer: PPO

## 2016-12-30 ENCOUNTER — Encounter (INDEPENDENT_AMBULATORY_CARE_PROVIDER_SITE_OTHER): Payer: Self-pay | Admitting: Physician Assistant

## 2016-12-30 ENCOUNTER — Ambulatory Visit (INDEPENDENT_AMBULATORY_CARE_PROVIDER_SITE_OTHER): Payer: PPO | Admitting: Physician Assistant

## 2016-12-30 DIAGNOSIS — G8929 Other chronic pain: Secondary | ICD-10-CM

## 2016-12-30 DIAGNOSIS — M545 Low back pain: Secondary | ICD-10-CM

## 2016-12-30 MED ORDER — METHYLPREDNISOLONE 4 MG PO TBPK: ORAL_TABLET | ORAL | 0 refills | Status: DC

## 2016-12-30 NOTE — Progress Notes (Signed)
Office Visit Note   Patient: Allison [name]           Date of Birth: [birth year]           MRN: [id number] Visit Date: 12/30/2016              Requested by: Denita Lung, MD [address] [place], [place] [address] PCP: Wyatt Haste, MD   Assessment & Plan: Visit Diagnoses:  1. Chronic bilateral low back pain, with sciatica presence unspecified     Plan: Place her on a Medrol Dosepak. Gave her back exercises to perform and IT band exercises to perform. See her back in a month check progress lack of pain continues despite conservative treatment would recommend MRI of the lumbar spine for epidural steroid planning  Follow-Up Instructions: Return in about 4 weeks (around 01/27/2017).   Orders:  Orders Placed This Encounter  Procedures  . XR Lumbar Spine 2-3 Views   Meds ordered this encounter  Medications  . methylPREDNISolone (MEDROL DOSEPAK) 4 MG TBPK tablet    Sig: TAKE AS DIRECTED    Dispense:  21 tablet    Refill:  0      Procedures: No procedures performed   Clinical Data: No additional findings.   Subjective: Chief Complaint  Patient presents with  . Lower Back - Pain    HPI Allison [name] is a 76 year old female well known Dr. Trevor Mace service. Comes in today with new complaint of down both legs and buttocks pain. Pain is been ongoing for at least a month. She's had no injury she states she initially had pain in mid January the ankle then it came back in some time in February. She's had no bowel or bladder dysfunction. No treatment for the back/buttocks pain. It is described as a shooting pain down both legs to the ankles never goes into the feet.  Review of Systems Chest pain shortness breath fevers chills. No numbness tingling down either leg. Positive for pain pain down both legs to the ankles. No awaking pain no bowel or bladder dysfunction.  Objective: Vital Signs: There were no vitals taken for this visit.  Physical Exam    Constitutional: She appears well-developed and well-nourished. No distress.  Cardiovascular: Intact distal pulses.   Pulmonary/Chest: Effort normal.  Neurological: She is alert.  Skin: She is not diaphoretic.  Psychiatric: She has a normal mood and affect. Her behavior is normal.    Ortho Exam Lower extremity she has 5 out of 5 strengths throughout the lower extremities against resistance. Deep tendon reflexes are 2+ at the knees and equal and symmetric 1+ at the ankles and equal symmetric. Sensation grossly intact bilateral feet to light touch. She has negative straight leg raise bilaterally. Able to touch her toes. She has a good extension without significant pain. No tenderness over the lumbar spinal column for lumbar paraspinous region. Excellent range of motion of both hips without pain. Slight tenderness over the right trochanteric region.  Specialty Comments:  No specialty comments available.  Imaging: Xr Lumbar Spine 2-3 Views  Result Date: 12/30/2016 Lumbar spine 2 views: No acute fracture. No spondylolisthesis. L4-5 out L5-S1 facet degenerative changes. Loss of disc space also at L4-5 and L5-S1. Arthrosclerosis sclerosis of the aorta.    PMFS History: Patient Active Problem List   Diagnosis Date Noted  . History of kidney stones 12/04/2015  . Arthritis 12/04/2015  . Hypothyroidism 12/04/2015  . HYPERCHOLESTEROLEMIA 08/05/2009  . Essential hypertension 08/05/2009  . GERD 08/05/2009  Past Medical History:  Diagnosis Date  . Allergy    RHINITIS  . Arthritis    lower back  . Former smoker   . GERD (gastroesophageal reflux disease)   . History of blood transfusion 1960   after devlivery of son  . History of kidney stones    lithrotripsy  . Hypercholesterolemia   . Hypertension   . Hypothyroid   . Macular degeneration    bilateral  . Tachycardia     Family History  Problem Relation Age of Onset  . Arthritis Mother   . Diabetes Mother   . Arthritis Father    . Diabetes Father   . Hyperlipidemia Father   . Cancer Paternal Grandmother   . Colon cancer Brother 57  . Colon polyps Brother 80  . Heart failure Sister   . Hyperlipidemia Sister   . Rectal cancer Neg Hx   . Stomach cancer Neg Hx   . Esophageal cancer Neg Hx     Past Surgical History:  Procedure Laterality Date  . ABDOMINAL HYSTERECTOMY    . APPENDECTOMY    . cataract surgery Bilateral 03/2014   polyps  . COLONOSCOPY  02/2010   Social History   Occupational History  . Not on file.   Social History Main Topics  . Smoking status: Former Smoker    Packs/day: 1.00    Years: 20.00    Types: Cigarettes    Quit date: 10/24/1993  . Smokeless tobacco: Never Used  . Alcohol use 3.0 oz/week    5 Glasses of wine per week     Comment: 4-5 glasses of wine per week.   . Drug use: No  . Sexual activity: Yes    Birth control/ protection: Post-menopausal

## 2016-12-30 NOTE — Progress Notes (Deleted)
C-

## 2017-01-02 NOTE — Telephone Encounter (Signed)
P.A. Approved, pt informed, mail order pharmacy

## 2017-01-28 ENCOUNTER — Ambulatory Visit (INDEPENDENT_AMBULATORY_CARE_PROVIDER_SITE_OTHER): Payer: PPO | Admitting: Physician Assistant

## 2017-01-28 ENCOUNTER — Other Ambulatory Visit: Payer: Self-pay | Admitting: Family Medicine

## 2017-01-28 ENCOUNTER — Other Ambulatory Visit (INDEPENDENT_AMBULATORY_CARE_PROVIDER_SITE_OTHER): Payer: Self-pay

## 2017-01-28 DIAGNOSIS — M5442 Lumbago with sciatica, left side: Secondary | ICD-10-CM

## 2017-01-28 DIAGNOSIS — M5441 Lumbago with sciatica, right side: Principal | ICD-10-CM

## 2017-01-28 DIAGNOSIS — G8929 Other chronic pain: Secondary | ICD-10-CM

## 2017-01-28 DIAGNOSIS — M5440 Lumbago with sciatica, unspecified side: Secondary | ICD-10-CM | POA: Diagnosis not present

## 2017-01-28 MED ORDER — DIAZEPAM 5 MG PO TABS: ORAL_TABLET | ORAL | 0 refills | Status: DC

## 2017-01-28 NOTE — Progress Notes (Signed)
Office Visit Note   Patient: Allison [name]           Date of Birth: [birth year]           MRN: [id number] Visit Date: 01/28/2017              Requested by: Denita Lung, MD [address] [place], [place] [address] PCP: Wyatt Haste, MD   Assessment & Plan: Visit Diagnoses:  1. Low back pain with sciatica, sciatica laterality unspecified, unspecified back pain laterality, unspecified chronicity     Plan: We will obtain an MRI of her lumbar spine to rule out HNP as the source of her radicular symptoms also for epidural steroid injection planning. Follow-up after the MRI to go over results and discuss further treatment  Follow-Up Instructions: Return in about 2 weeks (around 02/11/2017) for after mri.   Orders:  No orders of the defined types were placed in this encounter.  Meds ordered this encounter  Medications  . diazepam (VALIUM) 5 MG tablet    Sig: 1 po 1 hour before MRI, repeat as needed    Dispense:  2 tablet    Refill:  0      Procedures: No procedures performed   Clinical Data: No additional findings.   Subjective: Low back pain with radicular symptoms  HPI Mrs. Riederer returns today due to the radicular symptoms down both legs. She states that the Fontanet did help initially but was short lived. Still without any bowel or bladder dysfunction. Pain does not go into her feet just radiates down to the ankles. Review of Systems  No chest pain, shortness breath, fevers, or chills Objective: Vital Signs: There were no vitals taken for this visit.  Physical Exam Well-developed well-nourished female in no acute distress Ortho Exam Range of motion of both hips without pain. Good range of motion of both knees without pain. Tight hamstrings bilaterally. Negative straight leg raise bilaterally. Specialty Comments:  No specialty comments available.  Imaging: No results found.   PMFS History: Patient Active Problem List   Diagnosis Date  Noted  . Low back pain 01/28/2017  . History of kidney stones 12/04/2015  . Arthritis 12/04/2015  . Hypothyroidism 12/04/2015  . HYPERCHOLESTEROLEMIA 08/05/2009  . Essential hypertension 08/05/2009  . GERD 08/05/2009   Past Medical History:  Diagnosis Date  . Allergy    RHINITIS  . Arthritis    lower back  . Former smoker   . GERD (gastroesophageal reflux disease)   . History of blood transfusion 1960   after devlivery of son  . History of kidney stones    lithrotripsy  . Hypercholesterolemia   . Hypertension   . Hypothyroid   . Macular degeneration    bilateral  . Tachycardia     Family History  Problem Relation Age of Onset  . Arthritis Mother   . Diabetes Mother   . Arthritis Father   . Diabetes Father   . Hyperlipidemia Father   . Cancer Paternal Grandmother   . Colon cancer Brother 10  . Colon polyps Brother 70  . Heart failure Sister   . Hyperlipidemia Sister   . Rectal cancer Neg Hx   . Stomach cancer Neg Hx   . Esophageal cancer Neg Hx     Past Surgical History:  Procedure Laterality Date  . ABDOMINAL HYSTERECTOMY    . APPENDECTOMY    . cataract surgery Bilateral 03/2014   polyps  . COLONOSCOPY  02/2010   Social History  Occupational History  . Not on file.   Social History Main Topics  . Smoking status: Former Smoker    Packs/day: 1.00    Years: 20.00    Types: Cigarettes    Quit date: 10/24/1993  . Smokeless tobacco: Never Used  . Alcohol use 3.0 oz/week    5 Glasses of wine per week     Comment: 4-5 glasses of wine per week.   . Drug use: No  . Sexual activity: Yes    Birth control/ protection: Post-menopausal

## 2017-02-14 ENCOUNTER — Ambulatory Visit
Admission: RE | Admit: 2017-02-14 | Discharge: 2017-02-14 | Disposition: A | Discharge status: Home / Self Care | Payer: PPO | Source: Ambulatory Visit | Attending: Physician Assistant | Admitting: Physician Assistant

## 2017-02-14 DIAGNOSIS — M48061 Spinal stenosis, lumbar region without neurogenic claudication: Secondary | ICD-10-CM | POA: Diagnosis not present

## 2017-02-14 DIAGNOSIS — M5441 Lumbago with sciatica, right side: Principal | ICD-10-CM

## 2017-02-14 DIAGNOSIS — M5442 Lumbago with sciatica, left side: Principal | ICD-10-CM

## 2017-02-14 DIAGNOSIS — G8929 Other chronic pain: Secondary | ICD-10-CM

## 2017-02-21 ENCOUNTER — Encounter (INDEPENDENT_AMBULATORY_CARE_PROVIDER_SITE_OTHER): Payer: Self-pay | Admitting: Orthopaedic Surgery

## 2017-02-21 ENCOUNTER — Ambulatory Visit (INDEPENDENT_AMBULATORY_CARE_PROVIDER_SITE_OTHER): Payer: PPO | Admitting: Orthopaedic Surgery

## 2017-02-21 DIAGNOSIS — M5441 Lumbago with sciatica, right side: Secondary | ICD-10-CM | POA: Diagnosis not present

## 2017-02-21 DIAGNOSIS — G8929 Other chronic pain: Secondary | ICD-10-CM

## 2017-02-21 DIAGNOSIS — M5442 Lumbago with sciatica, left side: Secondary | ICD-10-CM | POA: Diagnosis not present

## 2017-02-21 NOTE — Progress Notes (Signed)
The patient is a very pleasant 76 year old, she is seeing for the first time but she's been seeing Benita Stabile, PA-C for several visits. She originally had right hip pain is a we saw her for but we determined that it up not being truly her hips and it was likely coming from her back. A steroid taper help with her hip pain but she still gets pain across lower aspect of her lumbar spine but it does radiate into both hips and behind both knees and occasionally into her feet. The pain is becoming debilitating for at times. She does not take a lot of medications for this. She denies a numbness and tingling in her feet or any specific weakness in her legs. She was sent for an MRI to evaluate her lumbar spine is here for review this today.  On examination she has good flexion-extension of the lumbar spine but extension does cause pain going down her legs. She is a thin individual she does have pain along the midline of her spine but mainly in the facet joint areas on both left and right side. She has a negative straight leg raise bilaterally. She has 5 out of 5 strength in all muscle groups of her lower extremities. Her reflexes seem to be normal and her sensation seems to be normal.  MRI is reviewed with her and she has significant spinal stenosis which is quite severe at L2-L3. She also has stenosis at L5 S1 and the radiologist feels it may be some issues with irritation of the left L5 nerve root. Certainly her disease seems to be at multiple levels.  She was someone that I would like to send to Dr. Ernestina Patches as a consultation/referral because I'm not sure what area where she would best benefit from a spinal intervention such as once he performs whether to epidural versus a facet injection. She certainly has disease at several levels and I do feel she would benefit from an injection and she agrees with this as well. I'm just not sure which level to consider injecting first I do feel a consultation with Dr. Ernestina Patches is  most appropriate this point. Work on getting her on his schedule for a visit. She understands she will likely not get an injection that day so he can see her from an exam standpoint and look at her MRI determined potentially what is the best intervention for her.

## 2017-03-01 ENCOUNTER — Other Ambulatory Visit (INDEPENDENT_AMBULATORY_CARE_PROVIDER_SITE_OTHER): Payer: Self-pay

## 2017-03-01 ENCOUNTER — Telehealth (INDEPENDENT_AMBULATORY_CARE_PROVIDER_SITE_OTHER): Payer: Self-pay | Admitting: Orthopaedic Surgery

## 2017-03-01 DIAGNOSIS — M5441 Lumbago with sciatica, right side: Principal | ICD-10-CM

## 2017-03-01 DIAGNOSIS — G8929 Other chronic pain: Secondary | ICD-10-CM

## 2017-03-01 DIAGNOSIS — M5442 Lumbago with sciatica, left side: Principal | ICD-10-CM

## 2017-03-01 NOTE — Telephone Encounter (Signed)
I am sorry the order is in there now

## 2017-03-09 ENCOUNTER — Ambulatory Visit (INDEPENDENT_AMBULATORY_CARE_PROVIDER_SITE_OTHER): Payer: PPO | Admitting: Physical Medicine and Rehabilitation

## 2017-03-09 ENCOUNTER — Encounter (INDEPENDENT_AMBULATORY_CARE_PROVIDER_SITE_OTHER): Payer: Self-pay | Admitting: Physical Medicine and Rehabilitation

## 2017-03-09 ENCOUNTER — Telehealth (INDEPENDENT_AMBULATORY_CARE_PROVIDER_SITE_OTHER): Payer: Self-pay | Admitting: Physical Medicine and Rehabilitation

## 2017-03-09 VITALS — BP 144/75 | HR 73

## 2017-03-09 DIAGNOSIS — G8929 Other chronic pain: Secondary | ICD-10-CM

## 2017-03-09 DIAGNOSIS — M5441 Lumbago with sciatica, right side: Secondary | ICD-10-CM | POA: Diagnosis not present

## 2017-03-09 DIAGNOSIS — M5442 Lumbago with sciatica, left side: Secondary | ICD-10-CM

## 2017-03-09 DIAGNOSIS — M47816 Spondylosis without myelopathy or radiculopathy, lumbar region: Secondary | ICD-10-CM | POA: Diagnosis not present

## 2017-03-09 DIAGNOSIS — M48062 Spinal stenosis, lumbar region with neurogenic claudication: Secondary | ICD-10-CM | POA: Diagnosis not present

## 2017-03-09 NOTE — Progress Notes (Deleted)
Lower back pain radiating into both legs- pain is on the side of both hips the worst. Pain goes almost to knees. No numbness or tingling. Hip pain is worse at night in bed; back pain is worse during the day with activity.

## 2017-03-11 ENCOUNTER — Encounter (INDEPENDENT_AMBULATORY_CARE_PROVIDER_SITE_OTHER): Payer: Self-pay | Admitting: Physical Medicine and Rehabilitation

## 2017-03-11 DIAGNOSIS — M48062 Spinal stenosis, lumbar region with neurogenic claudication: Secondary | ICD-10-CM | POA: Insufficient documentation

## 2017-03-11 NOTE — Telephone Encounter (Signed)
Faxed auth form with notes to [facility] [employer]

## 2017-03-11 NOTE — Progress Notes (Signed)
Allison Allison [name] - 76 y.o. Allison [name] [id number]  Date of birth: 1941-02-18  Office Visit Note: Visit Date: 03/09/2017 PCP: Allison Lung, MD Referred by: Allison Lung, MD  Subjective: Chief Complaint  Patient presents with  . Lower Back - Pain  . Right Hip - Pain  . Left Hip - Pain   HPI: Allison Allison [name] is a very pleasant 76 year old Allison [name] that is been followed Allison Allison [name], P.A.-C and Dr. Ninfa Allison [name] in our office. Allison Allison [name] initially presented with worsening chronic right hip pain but it was determined that they felt like this was coming from Allison Allison [name] back in an MRI was performed. Allison Allison [name] reports essentially axial low back pain worse with standing and ambulating and worse during the day with activity. This pain can radiate in a posterior lateral fashion into both hips and thighs. It will go almost to the knees but not usually past the knees. Allison Allison [name] denies any numbness tingling or paresthesias. Allison Allison [name] denies any focal weakness. Allison Allison [name] has not had any bowel or bladder symptoms. Allison Allison [name]'s not had any unexplained weight loss. Allison Allison [name]'s had no focal night pain. Allison Allison [name]'s had no trauma. Allison Allison [name] has not had any prior lumbar surgery. MRI lumbar spine showed significant stenosis at L2-3 with facet arthropathy throughout the lumbar spine at some left more than right lateral recess narrowing at L5-S1. Lumbar MRI is reviewed below. Allison Allison [name] has had anti-inflammatories and other conservative care including Medrol Dosepak without relief. Allison Allison [name] has had some therapy in the past for Allison Allison [name] hip. Allison Allison [name] does comment that with standing and walking Allison Allison [name] feels like there is a weight or heaviness from Allison Allison [name] buttocks down.    Review of Systems  Constitutional: Negative for chills, fever, malaise/fatigue and weight loss.  HENT: Negative for hearing loss and sinus pain.   Eyes: Negative for blurred vision, double vision and photophobia.  Respiratory: Negative for cough and shortness of breath.   Cardiovascular: Negative for chest pain, palpitations and leg swelling.    Gastrointestinal: Negative for abdominal pain, nausea and vomiting.  Genitourinary: Negative for flank pain.  Musculoskeletal: Positive for back pain and joint pain. Negative for myalgias.  Skin: Negative for itching and rash.  Neurological: Negative for tremors, focal weakness and weakness.  Endo/Heme/Allergies: Negative.   Psychiatric/Behavioral: Negative for depression.  All other systems reviewed and are negative.  Otherwise per HPI.  Assessment & Plan: Visit Diagnoses: No diagnosis found.  Plan: Findings:  Chronic worsening low back and bilateral hip and leg pain consistent both with stenosis and facet arthropathy. I think Allison Allison [name] is somewhat of a mixed picture. I do think Allison Allison [name] gets some pain from the facet joints but Allison Allison [name] does get this claudication type symptom into the buttock region with tightness and heaviness it's related to the stenosis. Allison Allison [name] previous level of stenosis is at L2-3 and there is severe stenosis here. I think from a diagnostic standpoint would be wise to complete an L2 bilateral transforaminal epidural steroid injection and just see how much relief Allison Allison [name] gets. Alternatively we could look at an intralaminar injection at L3-4. Depending on that relief we would watch Allison Allison [name] and probably regroup with physical therapy for core strengthening and evaluation. If Allison Allison [name] didn't get much relief I would look at facet joint blocks of the lower spine probably L4-5 and L5-S1. Depending on that Allison Allison [name] might be a candidate for radiofrequency ablation. Allison Allison [name] does have enough stenosis at L2-3 for  lumbar decompression as an alternative. I would not change any medications at this point. Allison Allison [name]'s not really interested  in strong pain medications. Allison Allison [name] is able to still function fairly well and Allison Allison [name] is very painful at times.    Meds & Orders: No orders of the defined types were placed in this encounter.  No orders of the defined types were placed in this encounter.   Follow-up: Return for Bilateral L2 transforaminal  approach steroid injection.   Procedures: No procedures performed  No notes on file   Clinical History: Lumbar spine MRI 02/14/2017  L2-3: Circumferential disc bulging and moderate facet and ligamentum flavum hypertrophy result in severe spinal stenosis, left greater than right lateral recess stenosis, and mild right and moderate left neural foraminal stenosis. A tiny synovial cyst is noted at the posteroinferior aspect of the left facet joint, not in a position to cause neural impingement.  L3-4: Circumferential disc bulging slightly greater to the left and moderate facet and ligamentum flavum hypertrophy result in mild bilateral lateral recess and mild left neural foraminal stenosis without significant overall spinal stenosis.  L4-5: Circumferential disc bulging, disc space height loss, and moderate facet and ligamentum flavum hypertrophy result in mild spinal stenosis, moderate left greater than right lateral recess stenosis, and moderate bilateral neural foraminal stenosis.  L5-S1: Circumferential disc bulging, slightly more focal central disc osteophyte complex, disc space height loss, and moderate left greater than right facet and ligamentum flavum hypertrophy result in mild left greater than right lateral recess stenosis and mild right and moderate to severe left neural foraminal stenosis. Bulky left far-lateral osteophyte could affect the left L5 nerve.  IMPRESSION: 1. Multilevel lumbar disc and facet degeneration, worst at L2-3 where there is severe spinal stenosis and moderate left foraminal stenosis. 2. Moderate lateral recess and foraminal stenosis at L4-5. 3. Moderate to severe left foraminal stenosis at L5-S1.  Allison Allison [name] reports that Allison Allison [name] quit smoking about 23 years ago. Allison Allison [name] smoking use included Cigarettes. Allison Allison [name] has a 20.00 pack-year smoking history. Allison Allison [name] has never used smokeless tobacco. No results for input(s): HGBA1C, LABURIC in the last 8760 hours.  Objective:   VS:  HT:    WT:   BMI:     BP:(!) 144/75  HR:73bpm  TEMP: ( )  RESP:  Physical Exam  Constitutional: Allison Allison [name] is oriented to person, place, and time. Allison Allison [name] appears well-developed and well-nourished.  Eyes: Conjunctivae and EOM are normal. Pupils are equal, round, and reactive to light.  Cardiovascular: Normal rate and intact distal pulses.   Pulmonary/Chest: Effort normal.  Musculoskeletal:  Patient is slow to rise from a seated position and has concordant low back pain with extension rotation of the lumbar spine. No pain over the greater trochanters or pain with hip rotation. Allison Allison [name] has good distal strength symmetrically. Allison Allison [name] has no clonus bilaterally.  Neurological: Allison Allison [name] is alert and oriented to person, place, and time. Allison Allison [name] exhibits normal muscle tone. Coordination normal.  Skin: Skin is warm and dry. No rash noted. No erythema.  Psychiatric: Allison Allison [name] has a normal mood and affect. Allison Allison [name] behavior is normal.  Nursing note and vitals reviewed.   Ortho Exam Imaging: No results found.  Past Medical/Family/Surgical/Social History: Medications & Allergies reviewed per EMR Patient Active Problem List   Diagnosis Date Noted  . Chronic bilateral low back pain with bilateral sciatica 01/28/2017  . History of kidney stones 12/04/2015  . Arthritis 12/04/2015  . Hypothyroidism 12/04/2015  . HYPERCHOLESTEROLEMIA 08/05/2009  . Essential hypertension 08/05/2009  . GERD 08/05/2009   Past Medical History:  Diagnosis Date  . Allergy    RHINITIS  . Arthritis  lower back  . Former smoker   . GERD (gastroesophageal reflux disease)   . History of blood transfusion 1960   after devlivery of son  . History of kidney stones    lithrotripsy  . Hypercholesterolemia   . Hypertension   . Hypothyroid   . Macular degeneration    bilateral  . Tachycardia    Family History  Problem Relation Age of Onset  . Arthritis Mother   . Diabetes Mother   . Arthritis Father   . Diabetes Father   . Hyperlipidemia  Father   . Colon cancer Brother 22  . Colon polyps Brother 49  . Cancer Paternal Grandmother   . Heart failure Sister   . Hyperlipidemia Sister   . Rectal cancer Neg Hx   . Stomach cancer Neg Hx   . Esophageal cancer Neg Hx    Past Surgical History:  Procedure Laterality Date  . ABDOMINAL HYSTERECTOMY    . APPENDECTOMY    . cataract surgery Bilateral 03/2014   polyps  . COLONOSCOPY  02/2010   Social History   Occupational History  . Not on file.   Social History Main Topics  . Smoking status: Former Smoker    Packs/day: 1.00    Years: 20.00    Types: Cigarettes    Quit date: 10/24/1993  . Smokeless tobacco: Never Used  . Alcohol use 3.0 oz/week    5 Glasses of wine per week     Comment: 4-5 glasses of wine per week.   . Drug use: No  . Sexual activity: Yes    Birth control/ protection: Post-menopausal

## 2017-03-23 NOTE — Telephone Encounter (Signed)
Received auth today. [id number]. eff 03/11/17-06/09/17. Called pt and scheduled her for tomorrow 03/24/17 @ 2:00 w/driver.

## 2017-03-23 NOTE — Telephone Encounter (Signed)
Still pending per website 

## 2017-03-24 ENCOUNTER — Encounter (INDEPENDENT_AMBULATORY_CARE_PROVIDER_SITE_OTHER): Payer: Self-pay | Admitting: Physical Medicine and Rehabilitation

## 2017-03-24 ENCOUNTER — Ambulatory Visit (INDEPENDENT_AMBULATORY_CARE_PROVIDER_SITE_OTHER): Payer: PPO

## 2017-03-24 ENCOUNTER — Ambulatory Visit (INDEPENDENT_AMBULATORY_CARE_PROVIDER_SITE_OTHER): Payer: PPO | Admitting: Physical Medicine and Rehabilitation

## 2017-03-24 VITALS — BP 144/76 | HR 76 | Temp 98.2°F

## 2017-03-24 DIAGNOSIS — M48062 Spinal stenosis, lumbar region with neurogenic claudication: Secondary | ICD-10-CM | POA: Diagnosis not present

## 2017-03-24 DIAGNOSIS — M5416 Radiculopathy, lumbar region: Secondary | ICD-10-CM | POA: Diagnosis not present

## 2017-03-24 MED ORDER — DEXAMETHASONE SODIUM PHOSPHATE 10 MG/ML IJ SOLN
15.0000 mg | Freq: Once | INTRAMUSCULAR | Status: AC
  Administered 2017-03-24: 15 mg

## 2017-03-24 MED ORDER — LIDOCAINE HCL (PF) 1 % IJ SOLN
2.0000 mL | Freq: Once | INTRAMUSCULAR | Status: AC
  Administered 2017-03-24: 2 mL

## 2017-03-24 NOTE — Patient Instructions (Signed)

## 2017-03-24 NOTE — Procedures (Signed)
Lumbosacral Transforaminal Epidural Steroid Injection - Infraneural Approach with Fluoroscopic Guidance  Patient: Allison [name]      Date of Birth: 1941/10/20 MRN: [id number] PCP: Denita Lung, MD      Visit Date: 03/24/2017   Mrs. Accardo is a very pleasant 76 year old female who comes in today for planned bilateral L2 transforaminal epidural steroid injection for severe stenosis at L2-3.  Please see our prior evaluation and management note for further details and justification.   Universal Protocol:     Consent Given By: the patient  Position: PRONE   Additional Comments: Vital signs were monitored before and after the procedure. Patient was prepped and draped in the usual sterile fashion. The correct patient, procedure, and site was verified.   Injection Procedure Details:  Procedure Site One Meds Administered:  Meds ordered this encounter  Medications  . lidocaine (PF) (XYLOCAINE) 1 % injection 2 mL  . dexamethasone (DECADRON) injection 15 mg      Laterality: Bilateral  Location/Site:  L2-L3  Needle size: 22 G  Needle type: Spinal  Needle Placement: Transforaminal  Findings:  -Contrast Used: 1 mL iohexol 180 mg iodine/mL   -Comments: Excellent flow of contrast along the nerve and into the epidural space.  Procedure Details: After squaring off the end-plates of the desired vertebral level to get a true AP view, the C-arm was obliqued to the painful side so that the superior articulating process is positioned about 1/3 the length of the inferior endplate.  The needle was aimed toward the junction of the superior articular process and the transverse process of the inferior vertebrae. The needle's initial entry is in the lower third of the foramen through Kambin's triangle. The soft tissues overlying this target were infiltrated with 2-3 ml. of 1% Lidocaine without Epinephrine.  The spinal needle was then inserted and advanced toward the target using a  "trajectory" view along the fluoroscope beam.  Under AP and lateral visualization, the needle was advanced so it did not puncture dura and did not traverse medially beyond the 6 o'clock position of the pedicle. Bi-planar projections were used to confirm position. Aspiration was confirmed to be negative for CSF and/or blood. A 1-2 ml. volume of Isovue-250 was injected and flow of contrast was noted at each level. Radiographs were obtained for documentation purposes.   After attaining the desired flow of contrast documented above, a 0.5 to 1.0 ml test dose of 0.25% Marcaine was injected into each respective transforaminal space.  The patient was observed for 90 seconds post injection.  After no sensory deficits were reported, and normal lower extremity motor function was noted,   the above injectate was administered so that equal amounts of the injectate were placed at each foramen (level) into the transforaminal epidural space.   Additional Comments:  No complications occurred Dressing: Band-Aid    Post-procedure details: Patient was observed during the procedure. Post-procedure instructions were reviewed.  Patient left the clinic in stable condition.

## 2017-03-24 NOTE — Progress Notes (Deleted)
Patient is here today for planned bilateral L2 transforaminal injection. No change in symptoms.

## 2017-04-07 ENCOUNTER — Ambulatory Visit (INDEPENDENT_AMBULATORY_CARE_PROVIDER_SITE_OTHER): Payer: PPO | Admitting: Physical Medicine and Rehabilitation

## 2017-07-11 ENCOUNTER — Encounter: Payer: Self-pay | Admitting: Family Medicine

## 2017-07-11 ENCOUNTER — Ambulatory Visit (INDEPENDENT_AMBULATORY_CARE_PROVIDER_SITE_OTHER): Payer: PPO | Admitting: Family Medicine

## 2017-07-11 VITALS — BP 120/60 | HR 79 | Resp 18 | Wt 178.0 lb

## 2017-07-11 DIAGNOSIS — E039 Hypothyroidism, unspecified: Secondary | ICD-10-CM | POA: Diagnosis not present

## 2017-07-11 DIAGNOSIS — N3001 Acute cystitis with hematuria: Secondary | ICD-10-CM

## 2017-07-11 DIAGNOSIS — Z23 Encounter for immunization: Secondary | ICD-10-CM

## 2017-07-11 DIAGNOSIS — R300 Dysuria: Secondary | ICD-10-CM | POA: Diagnosis not present

## 2017-07-11 LAB — POCT URINALYSIS DIP (PROADVANTAGE DEVICE)
Bilirubin, UA: NEGATIVE
Glucose, UA: NEGATIVE mg/dL
Ketones, POC UA: NEGATIVE mg/dL
Nitrite, UA: POSITIVE — AB
PH UA: 7 (ref 5.0–8.0)
PROTEIN UA: NEGATIVE mg/dL
Specific Gravity, Urine: 1.01

## 2017-07-11 MED ORDER — SULFAMETHOXAZOLE-TRIMETHOPRIM 800-160 MG PO TABS: 1.0000 | ORAL_TABLET | Freq: Two times a day (BID) | ORAL | 0 refills | Status: DC

## 2017-07-11 NOTE — Progress Notes (Signed)
   Subjective:    Patient ID: Allison [name], female    DOB: [birth year], 76 y.o.   MRN: [id number]  HPI She complains of a 2 day history of frequency and dysuria with low back pain. She has been taking Azo-Standard. No fever, chills, nausea or vomiting. She also feels quite fatigued and will like her thyroid function checked again.  Review of Systems     Objective:   Physical Exam Alert and in no distress. Urine microscopic was positive for white cells bacteria and red cells       Assessment & Plan:  Acute cystitis with hematuria  Dysuria - Plan: POCT Urinalysis DIP (Proadvantage Device)  Hypothyroidism, unspecified type  Need for influenza vaccination

## 2017-07-12 ENCOUNTER — Other Ambulatory Visit: Payer: Self-pay

## 2017-07-12 DIAGNOSIS — E039 Hypothyroidism, unspecified: Secondary | ICD-10-CM

## 2017-07-12 LAB — TSH: TSH: 0.1 m[IU]/L — AB (ref 0.40–4.50)

## 2017-07-12 MED ORDER — LEVOTHYROXINE SODIUM 75 MCG PO TABS: 75.0000 ug | ORAL_TABLET | Freq: Every day | ORAL | 0 refills | Status: DC

## 2017-07-25 ENCOUNTER — Other Ambulatory Visit: Payer: Self-pay | Admitting: Family Medicine

## 2017-07-25 DIAGNOSIS — R300 Dysuria: Secondary | ICD-10-CM

## 2017-07-26 ENCOUNTER — Other Ambulatory Visit (INDEPENDENT_AMBULATORY_CARE_PROVIDER_SITE_OTHER): Payer: PPO

## 2017-07-26 DIAGNOSIS — R300 Dysuria: Secondary | ICD-10-CM | POA: Diagnosis not present

## 2017-07-26 LAB — POCT URINALYSIS DIP (PROADVANTAGE DEVICE)
Bilirubin, UA: NEGATIVE
Blood, UA: NEGATIVE
GLUCOSE UA: NEGATIVE mg/dL
Ketones, POC UA: NEGATIVE mg/dL
Leukocytes, UA: NEGATIVE
NITRITE UA: NEGATIVE
PROTEIN UA: NEGATIVE mg/dL
Specific Gravity, Urine: 1.025
UUROB: NEGATIVE
pH, UA: 6 (ref 5.0–8.0)

## 2017-07-28 ENCOUNTER — Telehealth: Payer: Self-pay | Admitting: Family Medicine

## 2017-07-28 ENCOUNTER — Other Ambulatory Visit: Payer: Self-pay | Admitting: Family Medicine

## 2017-07-28 MED ORDER — LEVOTHYROXINE SODIUM 75 MCG PO TABS: 75.0000 ug | ORAL_TABLET | Freq: Every day | ORAL | 0 refills | Status: DC

## 2017-07-28 NOTE — Telephone Encounter (Signed)
Pt called and left a message that she was upset that the Levothroxine was being decreased to 75 mcg and should be called to [facility] instead of [facility] and that should have taken place over 2 weeks ago and that she would be back for her next visit before this is ever fixed.  I called pt backed reached her voice mail and advised correction of pharmacy made.  To let us know of any issues.

## 2017-08-29 DIAGNOSIS — Z1231 Encounter for screening mammogram for malignant neoplasm of breast: Secondary | ICD-10-CM | POA: Diagnosis not present

## 2017-08-29 LAB — HM MAMMOGRAPHY

## 2017-08-31 ENCOUNTER — Encounter: Payer: Self-pay | Admitting: Family Medicine

## 2017-09-20 ENCOUNTER — Other Ambulatory Visit: Payer: PPO

## 2017-09-20 DIAGNOSIS — E039 Hypothyroidism, unspecified: Secondary | ICD-10-CM | POA: Diagnosis not present

## 2017-09-21 ENCOUNTER — Telehealth: Payer: Self-pay

## 2017-09-21 LAB — TSH: TSH: 1.77 mIU/L (ref 0.40–4.50)

## 2017-09-21 MED ORDER — LEVOTHYROXINE SODIUM 75 MCG PO TABS: 75.0000 ug | ORAL_TABLET | Freq: Every day | ORAL | 3 refills | Status: DC

## 2017-09-21 NOTE — Telephone Encounter (Signed)
Pt states that she wants thyroid medication called to [facility]. This has been updated for pt.

## 2017-10-31 ENCOUNTER — Encounter: Payer: Self-pay | Admitting: Family Medicine

## 2017-10-31 ENCOUNTER — Ambulatory Visit (INDEPENDENT_AMBULATORY_CARE_PROVIDER_SITE_OTHER): Payer: PPO | Admitting: Family Medicine

## 2017-10-31 VITALS — BP 130/80 | HR 76 | Temp 97.9°F | Resp 16 | Wt 182.6 lb

## 2017-10-31 DIAGNOSIS — I10 Essential (primary) hypertension: Secondary | ICD-10-CM | POA: Diagnosis not present

## 2017-10-31 DIAGNOSIS — J069 Acute upper respiratory infection, unspecified: Secondary | ICD-10-CM

## 2017-10-31 DIAGNOSIS — K219 Gastro-esophageal reflux disease without esophagitis: Secondary | ICD-10-CM | POA: Diagnosis not present

## 2017-10-31 MED ORDER — LOSARTAN POTASSIUM-HCTZ 100-25 MG PO TABS: 1.0000 | ORAL_TABLET | Freq: Every day | ORAL | 3 refills | Status: DC

## 2017-10-31 MED ORDER — OMEPRAZOLE 20 MG PO CPDR: 20.0000 mg | DELAYED_RELEASE_CAPSULE | Freq: Every day | ORAL | 3 refills | Status: DC

## 2017-10-31 NOTE — Progress Notes (Signed)
   Subjective:    Patient ID: Allison [name], female    DOB: 03/29/1941, 77 y.o.   MRN: [id number]  HPI She complains of a 4-day history started with slight sore throat and cough that has become conductive with malaise and myalgias but no fever, chills, earache, shortness of breath.  She also needs a refill on her reflux medication as well as her blood pressure medicine.  She does take the reflux med on a daily basis.   Review of Systems     Objective:   Physical Exam Alert and in no distress. Tympanic membranes and canals are normal. Pharyngeal area is normal. Neck is supple without adenopathy or thyromegaly. Cardiac exam shows a regular sinus rhythm without murmurs or gallops. Lungs are clear to auscultation.        Assessment & Plan:  Gastroesophageal reflux disease without esophagitis  Essential hypertension  Viral upper respiratory tract infection Symptomatic care for the upper respiratory infection.  She will call me at the end of the week if she is not getting better. Recommend she use the Prilosec every other day and then potentially every third day to keep her symptoms under control.

## 2017-11-03 DIAGNOSIS — H35033 Hypertensive retinopathy, bilateral: Secondary | ICD-10-CM | POA: Diagnosis not present

## 2017-11-03 DIAGNOSIS — Z961 Presence of intraocular lens: Secondary | ICD-10-CM | POA: Diagnosis not present

## 2017-11-03 DIAGNOSIS — H353133 Nonexudative age-related macular degeneration, bilateral, advanced atrophic without subfoveal involvement: Secondary | ICD-10-CM | POA: Diagnosis not present

## 2017-11-03 DIAGNOSIS — H43811 Vitreous degeneration, right eye: Secondary | ICD-10-CM | POA: Diagnosis not present

## 2018-03-08 ENCOUNTER — Encounter (INDEPENDENT_AMBULATORY_CARE_PROVIDER_SITE_OTHER): Payer: Self-pay | Admitting: Physician Assistant

## 2018-03-08 ENCOUNTER — Ambulatory Visit (INDEPENDENT_AMBULATORY_CARE_PROVIDER_SITE_OTHER): Payer: PPO | Admitting: Physician Assistant

## 2018-03-08 DIAGNOSIS — M65331 Trigger finger, right middle finger: Secondary | ICD-10-CM | POA: Diagnosis not present

## 2018-03-08 MED ORDER — LIDOCAINE HCL 1 % IJ SOLN
0.5000 mL | INTRAMUSCULAR | Status: AC | PRN
Start: 2018-03-08 — End: 2018-03-08
  Administered 2018-03-08: .5 mL

## 2018-03-08 MED ORDER — METHYLPREDNISOLONE ACETATE 40 MG/ML IJ SUSP
20.0000 mg | INTRAMUSCULAR | Status: AC | PRN
  Administered 2018-03-08: 20 mg

## 2018-03-08 NOTE — Progress Notes (Signed)
   Procedure Note  Patient: TEMISHA MURLEY             Date of Birth: 29-Apr-1941           MRN: [id number]             Visit Date: 03/08/2018 HPI: Mrs. Goding comes in today with a new complaint right hand middle trigger finger.  States the middle finger is triggering.  Actually gets caught at times that she has to use her left hand to straighten out.  She had no injury.  She is been taking some Aleve but no relief. Review of systems: No fevers chills shortness of breath chest pain  Physical exam: General well-developed well-nourished female no acute distress made affect appropriate Right hand: Sensation intact.  Active right middle trigger finger.  No rash skin lesions ulcerations otherwise.  Painful nodule at the A1 pulley. Procedures: Visit Diagnoses: Trigger finger, right middle finger  Hand/UE Inj: R long A1 for trigger finger on 03/08/2018 2:32 PM Details: 25 G needle, volar approach Medications: 0.5 mL lidocaine 1 %; 20 mg methylPREDNISolone acetate 40 MG/ML Consent was given by the patient. Immediately prior to procedure a time out was called to verify the correct patient, procedure, equipment, support staff and site/side marked as required. Patient was prepped and draped in the usual sterile fashion.    Plan: She is given some pen said she will apply 2 g up to 3 times daily to the A1 pulley of the long finger right hand.  She will follow-up with [place] on an as-needed basis pain persist or becomes worse.  Questions were encouraged and answered.

## 2018-05-23 ENCOUNTER — Other Ambulatory Visit: Payer: Self-pay | Admitting: Family Medicine

## 2018-05-23 DIAGNOSIS — E7800 Pure hypercholesterolemia, unspecified: Secondary | ICD-10-CM

## 2018-08-03 ENCOUNTER — Encounter: Payer: Self-pay | Admitting: Family Medicine

## 2018-08-03 ENCOUNTER — Ambulatory Visit (INDEPENDENT_AMBULATORY_CARE_PROVIDER_SITE_OTHER): Payer: PPO | Admitting: Family Medicine

## 2018-08-03 VITALS — BP 110/78 | HR 73 | Temp 98.1°F | Ht 66.0 in | Wt 185.0 lb

## 2018-08-03 DIAGNOSIS — K219 Gastro-esophageal reflux disease without esophagitis: Secondary | ICD-10-CM | POA: Diagnosis not present

## 2018-08-03 DIAGNOSIS — Z23 Encounter for immunization: Secondary | ICD-10-CM

## 2018-08-03 DIAGNOSIS — I10 Essential (primary) hypertension: Secondary | ICD-10-CM

## 2018-08-03 DIAGNOSIS — E7800 Pure hypercholesterolemia, unspecified: Secondary | ICD-10-CM | POA: Diagnosis not present

## 2018-08-03 DIAGNOSIS — N3281 Overactive bladder: Secondary | ICD-10-CM | POA: Diagnosis not present

## 2018-08-03 DIAGNOSIS — E039 Hypothyroidism, unspecified: Secondary | ICD-10-CM

## 2018-08-03 DIAGNOSIS — Z87442 Personal history of urinary calculi: Secondary | ICD-10-CM

## 2018-08-03 DIAGNOSIS — Z0000 Encounter for general adult medical examination without abnormal findings: Secondary | ICD-10-CM

## 2018-08-03 DIAGNOSIS — M1990 Unspecified osteoarthritis, unspecified site: Secondary | ICD-10-CM

## 2018-08-03 LAB — POCT URINALYSIS DIP (PROADVANTAGE DEVICE)
BILIRUBIN UA: NEGATIVE mg/dL
Bilirubin, UA: NEGATIVE
Blood, UA: NEGATIVE
Glucose, UA: NEGATIVE mg/dL
Nitrite, UA: NEGATIVE
PROTEIN UA: NEGATIVE mg/dL
Specific Gravity, Urine: 1.01
Urobilinogen, Ur: 3.5
pH, UA: 8.5 — AB (ref 5.0–8.0)

## 2018-08-03 MED ORDER — OMEPRAZOLE 20 MG PO CPDR: 20.0000 mg | DELAYED_RELEASE_CAPSULE | Freq: Every day | ORAL | 3 refills | Status: DC

## 2018-08-03 MED ORDER — SIMVASTATIN 40 MG PO TABS: 40.0000 mg | ORAL_TABLET | Freq: Every day | ORAL | 3 refills | Status: DC

## 2018-08-03 MED ORDER — LEVOTHYROXINE SODIUM 75 MCG PO TABS: 75.0000 ug | ORAL_TABLET | Freq: Every day | ORAL | 3 refills | Status: DC

## 2018-08-03 MED ORDER — SOLIFENACIN SUCCINATE 5 MG PO TABS: 5.0000 mg | ORAL_TABLET | Freq: Every day | ORAL | 1 refills | Status: DC

## 2018-08-03 MED ORDER — LOSARTAN POTASSIUM-HCTZ 100-25 MG PO TABS: 1.0000 | ORAL_TABLET | Freq: Every day | ORAL | 3 refills | Status: DC

## 2018-08-03 NOTE — Progress Notes (Signed)
Allison [name] is a 77 y.o. female who presents for annual wellness visit and follow-up on chronic medical conditions.  She has has no particular concerns or complaints other than continue to difficulty with nocturia as well as frequencies during the day.  She does have the urge but has not had any incontinence.  She does limit her intake of fluids at night.  She continues on losartan/HCTZ for her blood pressure.  Her reflux seems to be under good control with Prilosec.  She continues on Synthroid and has no difficulty with that.  She continues on simvastatin for her lipids and has no aches or pains with that.  She does have some minimal arthritic changes and has had some back trouble in the past with this.  Also has remote history of kidney stones.  She does not have an advanced directive but plans to get this taken care of.  Immunizations and Health Maintenance Immunization History  Administered Date(s) Administered  . DTaP 05/13/1987  . HPV Quadrivalent 04/30/1955, 06/01/1955, 01/14/1956  . Influenza Split 08/09/2015  . Influenza Whole 08/17/2007  . Influenza, High Dose Seasonal PF 07/23/2016, 07/11/2017  . Pneumococcal Conjugate-13 12/04/2015  . Pneumococcal Polysaccharide-23 08/17/2007, 10/21/2014  . Tdap 12/04/2015  . Zoster 12/04/2015   Health Maintenance Due  Topic Date Due  . INFLUENZA VACCINE  05/25/2018    Last Pap smear: aged out  Last mammogram:fall of 2018 Last colonoscopy:07-08-2015 Last DEXA: 2018 Dentist:02/2018 Ophtho:fall of 2018 Exercise: walking , yard work  Other doctors caring for patient include:Pyrtle  Advanced directives: Does Patient Have a Catering manager?: No Would patient like information on creating a medical advance directive?: No - Patient declined  Depression screen:  See questionnaire below.  Depression screen [facility] 2/9 08/03/2018 12/09/2016 12/04/2015 05/02/2014  Decreased Interest 0 0 0 3  Down, Depressed, Hopeless 0 0 0 3  PHQ - 2 Score 0  0 0 6    Fall Risk Screen: see questionnaire below. Fall Risk  08/03/2018 12/09/2016 12/04/2015 05/02/2014  Falls in the past year? No No No No    ADL screen:  See questionnaire below Functional Status Survey: Is the patient deaf or have difficulty hearing?: No Does the patient have difficulty seeing, even when wearing glasses/contacts?: No Does the patient have difficulty concentrating, remembering, or making decisions?: No Does the patient have difficulty walking or climbing stairs?: No Does the patient have difficulty dressing or bathing?: No Does the patient have difficulty doing errands alone such as visiting a doctor's office or shopping?: No   Review of Systems Constitutional: -, -unexpected weight change, -anorexia, -fatigue Allergy: -sneezing, -itching, -congestion Dermatology: denies changing moles, rash, lumps ENT: -runny nose, -ear pain, -sore throat,  Cardiology:  -chest pain, -palpitations, -orthopnea, Respiratory: -cough, -shortness of breath, -dyspnea on exertion, -wheezing,  Gastroenterology: -abdominal pain, -nausea, -vomiting, -diarrhea, -constipation, -dysphagia Hematology: -bleeding or bruising problems Musculoskeletal: -arthralgias, -myalgias, -joint swelling, -back pain, - Ophthalmology: -vision changes,  Urology: -dysuria, -difficulty urinating,  -urinary frequency, -urgency, incontinence Neurology: -, -numbness, , -memory loss, -falls, -dizziness    PHYSICAL EXAM:  BP 110/78 (BP Location: Left Arm, Patient Position: Sitting)   Pulse 73   Temp 98.1 F (36.7 C)   Ht 5\' 6"  (1.676 m)   Wt 185 lb (83.9 kg)   SpO2 95%   BMI 29.86 kg/m   General Appearance: Alert, cooperative, no distress, appears stated age Head: Normocephalic, without obvious abnormality, atraumatic Eyes: PERRL, conjunctiva/corneas clear, EOM's intact, fundi benign Ears: Normal TM's  and external ear canals Nose: Nares normal, mucosa normal, no drainage or sinus tenderness Throat:  Lips, mucosa, and tongue normal; teeth and gums normal Neck: Supple, no lymphadenopathy;  thyroid:  no enlargement/tenderness/nodules; no carotid bruit or JVD Lungs: Clear to auscultation bilaterally without wheezes, rales or ronchi; respirations unlabored Heart: Regular rate and rhythm, S1 and S2 normal, no murmur, rubor gallop Abdomen: Soft, non-tender, nondistended, normoactive bowel sounds,  no masses, no hepatosplenomegaly Extremities: No clubbing, cyanosis or edema Pulses: 2+ and symmetric all extremities Skin:  Skin color, texture, turgor normal, no rashes or lesions Lymph nodes: Cervical, supraclavicular, and axillary nodes normal Neurologic:  CNII-XII intact, normal strength, sensation and gait; reflexes 2+ and symmetric throughout Psych: Normal mood, affect, hygiene and grooming.  ASSESSMENT/PLAN: Routine general medical examination at a health care facility - Plan: CBC with Differential/Platelet, Comprehensive metabolic panel, Lipid panel  OAB (overactive bladder) - Plan: solifenacin (VESICARE) 5 MG tablet  Essential hypertension - Plan: CBC with Differential/Platelet, Comprehensive metabolic panel, losartan-hydrochlorothiazide (HYZAAR) 100-25 MG tablet  Gastroesophageal reflux disease without esophagitis - Plan: omeprazole (PRILOSEC) 20 MG capsule  Hypothyroidism, unspecified type - Plan: TSH, levothyroxine (SYNTHROID, LEVOTHROID) 75 MCG tablet  HYPERCHOLESTEROLEMIA - Plan: Lipid panel, simvastatin (ZOCOR) 40 MG tablet  Need for influenza vaccination - Plan: Flu vaccine HIGH DOSE PF (Fluzone High dose)  Arthritis - Plan: CBC with Differential/Platelet, Comprehensive metabolic panel  History of kidney stones - Plan: CBC with Differential/Platelet, Comprehensive metabolic panel I discussed the OAB with her.  We will place her on Vesicare.  She is to let me know within the next several weeks how she is doing.  Otherwise she will continue on her present medication  regimen.    at least 30 minutes of aerobic activity at least 5 days/week and weight-bearing exercise 2x/week; proper sunscreen use reviewed; healthy diet, including goals of calcium and vitamin D intake and alcohol recommendations (less than or equal to 1 drink/day) reviewed; regular seatbelt use; changing batteries in smoke detectors.  Immunization recommendations discussed.  Colonoscopy recommendations reviewed   Medicare Attestation I have personally reviewed: The patient's medical and social history Their use of alcohol, tobacco or illicit drugs Their current medications and supplements The patient's functional ability including ADLs,fall risks, home safety risks, cognitive, and hearing and visual impairment Diet and physical activities Evidence for depression or mood disorders  The patient's weight, height, and BMI have been recorded in the chart.  I have made referrals, counseling, and provided education to the patient based on review of the above and I have provided the patient with a written personalized care plan for preventive services.     Jill Alexanders, MD   08/03/2018

## 2018-08-04 ENCOUNTER — Other Ambulatory Visit: Payer: Self-pay

## 2018-08-04 DIAGNOSIS — N3281 Overactive bladder: Secondary | ICD-10-CM

## 2018-08-04 LAB — CBC WITH DIFFERENTIAL/PLATELET
BASOS ABS: 0.1 10*3/uL (ref 0.0–0.2)
Basos: 1 %
EOS (ABSOLUTE): 0.3 10*3/uL (ref 0.0–0.4)
EOS: 3 %
HEMATOCRIT: 37.8 % (ref 34.0–46.6)
HEMOGLOBIN: 12.7 g/dL (ref 11.1–15.9)
IMMATURE GRANS (ABS): 0 10*3/uL (ref 0.0–0.1)
IMMATURE GRANULOCYTES: 0 %
LYMPHS: 23 %
Lymphocytes Absolute: 1.8 10*3/uL (ref 0.7–3.1)
MCH: 32.4 pg (ref 26.6–33.0)
MCHC: 33.6 g/dL (ref 31.5–35.7)
MCV: 96 fL (ref 79–97)
MONOCYTES: 7 %
Monocytes Absolute: 0.5 10*3/uL (ref 0.1–0.9)
Neutrophils Absolute: 4.9 10*3/uL (ref 1.4–7.0)
Neutrophils: 66 %
Platelets: 219 10*3/uL (ref 150–450)
RBC: 3.92 x10E6/uL (ref 3.77–5.28)
RDW: 12 % — ABNORMAL LOW (ref 12.3–15.4)
WBC: 7.5 10*3/uL (ref 3.4–10.8)

## 2018-08-04 LAB — COMPREHENSIVE METABOLIC PANEL
ALK PHOS: 46 IU/L (ref 39–117)
ALT: 13 IU/L (ref 0–32)
AST: 24 IU/L (ref 0–40)
Albumin/Globulin Ratio: 1.8 (ref 1.2–2.2)
Albumin: 4.4 g/dL (ref 3.5–4.8)
BILIRUBIN TOTAL: 0.5 mg/dL (ref 0.0–1.2)
BUN/Creatinine Ratio: 14 (ref 12–28)
BUN: 16 mg/dL (ref 8–27)
CHLORIDE: 103 mmol/L (ref 96–106)
CO2: 26 mmol/L (ref 20–29)
Calcium: 10.1 mg/dL (ref 8.7–10.3)
Creatinine, Ser: 1.12 mg/dL — ABNORMAL HIGH (ref 0.57–1.00)
GFR calc Af Amer: 54 mL/min/{1.73_m2} — ABNORMAL LOW (ref >59)
GFR calc non Af Amer: 47 mL/min/{1.73_m2} — ABNORMAL LOW (ref >59)
GLUCOSE: 87 mg/dL (ref 65–99)
Globulin, Total: 2.5 g/dL (ref 1.5–4.5)
Potassium: 4.6 mmol/L (ref 3.5–5.2)
Sodium: 143 mmol/L (ref 134–144)
Total Protein: 6.9 g/dL (ref 6.0–8.5)

## 2018-08-04 LAB — TSH: TSH: 2.6 u[IU]/mL (ref 0.450–4.500)

## 2018-08-04 LAB — LIPID PANEL
CHOLESTEROL TOTAL: 170 mg/dL (ref 100–199)
Chol/HDL Ratio: 3.3 ratio (ref 0.0–4.4)
HDL: 52 mg/dL (ref >39)
LDL Calculated: 82 mg/dL (ref 0–99)
TRIGLYCERIDES: 178 mg/dL — AB (ref 0–149)
VLDL Cholesterol Cal: 36 mg/dL (ref 5–40)

## 2018-08-04 MED ORDER — SOLIFENACIN SUCCINATE 5 MG PO TABS: 5.0000 mg | ORAL_TABLET | Freq: Every day | ORAL | 3 refills | Status: DC

## 2018-08-04 NOTE — Telephone Encounter (Signed)
Pharmacy is requesting a 90 day supply for the pending medication  

## 2018-08-14 ENCOUNTER — Telehealth: Payer: Self-pay | Admitting: Family Medicine

## 2018-08-14 NOTE — Telephone Encounter (Signed)
Pt left message and states you called in rx to [facility] for OAB.  Pt copay 180.00,  She cannot afford that.  Please call in something cheaper and let pt know if done.  Pt [phone]

## 2018-08-14 NOTE — Telephone Encounter (Signed)
She will have to check with her insurance carrier to find out which drug that class is cheaper.  Tell her that there is no way for me to find out

## 2018-08-14 NOTE — Telephone Encounter (Signed)
Pt made aware and will call back . [place]

## 2018-08-16 ENCOUNTER — Telehealth: Payer: Self-pay

## 2018-08-16 ENCOUNTER — Other Ambulatory Visit: Payer: Self-pay

## 2018-08-16 MED ORDER — OXYBUTYNIN CHLORIDE ER 5 MG PO TB24: 5.0000 mg | ORAL_TABLET | Freq: Every day | ORAL | 1 refills | Status: DC

## 2018-08-16 NOTE — Telephone Encounter (Signed)
[facility]. Changed pharmacy per pt request. [facility]

## 2018-08-16 NOTE — Telephone Encounter (Signed)
Let her know that I called it into the [facility] and have her let me know how well it works.

## 2018-08-16 NOTE — Telephone Encounter (Signed)
Pt says her  Insurance will better cover oxybutin. Please advise if it can be switched to this med . [place].

## 2018-08-18 ENCOUNTER — Telehealth: Payer: Self-pay | Admitting: Family Medicine

## 2018-08-18 MED ORDER — OXYBUTYNIN CHLORIDE ER 5 MG PO TB24: 5.0000 mg | ORAL_TABLET | Freq: Every day | ORAL | 0 refills | Status: DC

## 2018-08-18 NOTE — Telephone Encounter (Signed)
I took care of this already

## 2018-08-18 NOTE — Telephone Encounter (Signed)
Pharmacy called and states they can only do a 90 day supply of Oxybutynin 5 mg, they can not do 30 day supply if you could please resend pt uses [facility] - [place], [place]

## 2018-08-18 NOTE — Telephone Encounter (Signed)
Please take care of this for her since Dr. Redmond School is not here today. Have her follow up with him and let him know how the medication is working.

## 2018-08-18 NOTE — Telephone Encounter (Signed)
Forwarding to you since dr Redmond School is not in the office

## 2018-08-25 ENCOUNTER — Telehealth: Payer: Self-pay | Admitting: Family Medicine

## 2018-08-25 NOTE — Telephone Encounter (Signed)
Pt left message about her last Rx but didn't say which one, that it was sent to wrong pharmacy and had questions about it.  I called her and no answer and was unable to leave a message

## 2018-09-27 ENCOUNTER — Telehealth: Payer: Self-pay

## 2018-09-27 NOTE — Telephone Encounter (Signed)
Patient stated she is having symptoms of a UTI and wants to know if she can come in and leave a urine sample. Please advise

## 2018-09-27 NOTE — Telephone Encounter (Signed)
Have her drop off a specimen

## 2018-09-27 NOTE — Telephone Encounter (Signed)
Called pt back to advise to drop off sample no answer and unable to lvm . [place]

## 2018-10-03 ENCOUNTER — Ambulatory Visit (INDEPENDENT_AMBULATORY_CARE_PROVIDER_SITE_OTHER): Payer: PPO | Admitting: Medical

## 2018-10-03 ENCOUNTER — Encounter: Payer: Self-pay | Admitting: Medical

## 2018-10-03 VITALS — BP 136/80 | HR 78 | Temp 98.1°F | Resp 16 | Ht 66.0 in | Wt 181.2 lb

## 2018-10-03 DIAGNOSIS — R300 Dysuria: Secondary | ICD-10-CM | POA: Diagnosis not present

## 2018-10-03 DIAGNOSIS — N3000 Acute cystitis without hematuria: Secondary | ICD-10-CM

## 2018-10-03 LAB — POCT URINALYSIS DIP (PROADVANTAGE DEVICE)
BILIRUBIN UA: NEGATIVE mg/dL
Bilirubin, UA: NEGATIVE
Blood, UA: NEGATIVE
GLUCOSE UA: NEGATIVE mg/dL
Nitrite, UA: NEGATIVE
SPECIFIC GRAVITY, URINE: 1.015
Urobilinogen, Ur: NEGATIVE
pH, UA: 6 (ref 5.0–8.0)

## 2018-10-03 MED ORDER — SULFAMETHOXAZOLE-TRIMETHOPRIM 800-160 MG PO TABS: 1.0000 | ORAL_TABLET | Freq: Two times a day (BID) | ORAL | 0 refills | Status: DC

## 2018-10-03 NOTE — Progress Notes (Signed)
Subjective:   Allison [name] is a 77 y.o. female who complains of possible urinary tract infection.  They report symptoms for 3 days.  Symptoms include urinary frequency, urgency, urinary odor, pinkish looking color.  She has had a little bit of back pain but that is not new.  No abdominal pain, no fever, no nausea, no vomiting, no diarrhea.  No vaginal symptoms.  Last UTI was well over a year ago.  She notes no change in her hydration or her routine to cause a urinary tract infection.  No other aggravating or relieving factors.  No other c/o.  Past Medical History:  Diagnosis Date  . Allergy    RHINITIS  . Arthritis    lower back  . Former smoker   . GERD (gastroesophageal reflux disease)   . History of blood transfusion 1960   after devlivery of son  . History of kidney stones    lithrotripsy  . Hypercholesterolemia   . Hypertension   . Hypothyroid   . Macular degeneration    bilateral  . Tachycardia     Current Outpatient Medications on File Prior to Visit  Medication Sig Dispense Refill  . aspirin 81 MG tablet Take 81 mg by mouth daily.    . beta carotene w/minerals (OCUVITE) tablet Take 2 tablets by mouth daily.     Marland Kitchen co-enzyme Q-10 30 MG capsule Take 30 mg by mouth daily.      Marland Kitchen levothyroxine (SYNTHROID, LEVOTHROID) 75 MCG tablet Take 1 tablet (75 mcg total) by mouth daily. 90 tablet 3  . losartan-hydrochlorothiazide (HYZAAR) 100-25 MG tablet Take 1 tablet by mouth daily. 90 tablet 3  . Multiple Vitamins-Minerals (MULTIVITAMIN ADULT PO) Take 1 capsule by mouth 2 (two) times daily.    Marland Kitchen omeprazole (PRILOSEC) 20 MG capsule Take 1 capsule (20 mg total) by mouth daily. 90 capsule 3  . simvastatin (ZOCOR) 40 MG tablet Take 1 tablet (40 mg total) by mouth daily. 90 tablet 3  . oxybutynin (DITROPAN-XL) 5 MG 24 hr tablet Take 1 tablet (5 mg total) by mouth at bedtime. (Patient not taking: Reported on 10/03/2018) 90 tablet 0  . zolpidem (AMBIEN) 5 MG tablet Take 1 tablet (5 mg total)  by mouth at bedtime as needed for sleep. (Patient not taking: Reported on 10/03/2018) 90 tablet 0   No current facility-administered medications on file prior to visit.     ROS as in subjective  Reviewed allergies, medications, past medical, surgical, and social history.     Objective: BP 136/80   Pulse 78   Temp 98.1 F (36.7 C) (Oral)   Resp 16   Ht 5\' 6"  (1.676 m)   Wt 181 lb 3.2 oz (82.2 kg)   SpO2 96%   BMI 29.25 kg/m   General appearance: alert, no distress, WD/WN Abdomen: +bs, soft, non tender, non distended, no masses, no hepatomegaly, no splenomegaly, no bruits Back: no CVA tenderness GU: deferred      Assessment: Encounter Diagnoses  Name Primary?  . Dysuria Yes  . Acute cystitis without hematuria      Plan: Discussed symptoms, diagnosis or urinary tract infection, possible complications, and usual course of illness.  Begin medication Bactrim as below.  She has tolerated this well in the past..  Advised increased water intake, can use OTC  Tylenol for pain prn.   Urine culture sent.  Call or return if worse or not improving in the next 3-4 days.     Allison [name] was seen  today for uti.  Diagnoses and all orders for this visit:  Dysuria -     POCT Urinalysis DIP (Proadvantage Device) -     Urine Culture  Acute cystitis without hematuria -     Urine Culture  Other orders -     sulfamethoxazole-trimethoprim (BACTRIM DS,SEPTRA DS) 800-160 MG tablet; Take 1 tablet by mouth 2 (two) times daily.

## 2018-10-04 LAB — URINE CULTURE

## 2018-10-30 ENCOUNTER — Telehealth: Payer: Self-pay | Admitting: Family Medicine

## 2018-10-30 NOTE — Telephone Encounter (Signed)
Pt called back and is ok switching to different manufacture.  Will fax form

## 2018-10-30 NOTE — Telephone Encounter (Signed)
Recv'd fax from [employer] that Levothyroxine by [employer] not available and need approval to dispense a different manufacture.  Per Dr. Redmond School ok if it is ok with ok, Left message for pt

## 2018-11-07 DIAGNOSIS — Z961 Presence of intraocular lens: Secondary | ICD-10-CM | POA: Diagnosis not present

## 2018-11-07 DIAGNOSIS — H353133 Nonexudative age-related macular degeneration, bilateral, advanced atrophic without subfoveal involvement: Secondary | ICD-10-CM | POA: Diagnosis not present

## 2018-11-07 DIAGNOSIS — H43811 Vitreous degeneration, right eye: Secondary | ICD-10-CM | POA: Diagnosis not present

## 2018-11-07 DIAGNOSIS — H35033 Hypertensive retinopathy, bilateral: Secondary | ICD-10-CM | POA: Diagnosis not present

## 2018-11-07 LAB — HM DIABETES EYE EXAM

## 2018-11-14 ENCOUNTER — Ambulatory Visit (INDEPENDENT_AMBULATORY_CARE_PROVIDER_SITE_OTHER): Payer: PPO | Admitting: Family Medicine

## 2018-11-14 ENCOUNTER — Encounter: Payer: Self-pay | Admitting: Family Medicine

## 2018-11-14 VITALS — BP 112/72 | HR 88 | Temp 98.0°F | Wt 183.4 lb

## 2018-11-14 DIAGNOSIS — J101 Influenza due to other identified influenza virus with other respiratory manifestations: Secondary | ICD-10-CM | POA: Diagnosis not present

## 2018-11-14 DIAGNOSIS — R399 Unspecified symptoms and signs involving the genitourinary system: Secondary | ICD-10-CM | POA: Diagnosis not present

## 2018-11-14 DIAGNOSIS — R509 Fever, unspecified: Secondary | ICD-10-CM | POA: Diagnosis not present

## 2018-11-14 DIAGNOSIS — N3000 Acute cystitis without hematuria: Secondary | ICD-10-CM

## 2018-11-14 LAB — POCT URINALYSIS DIP (PROADVANTAGE DEVICE)
BILIRUBIN UA: NEGATIVE
BILIRUBIN UA: NEGATIVE mg/dL
Blood, UA: NEGATIVE
Glucose, UA: NEGATIVE mg/dL
NITRITE UA: NEGATIVE
PROTEIN UA: NEGATIVE mg/dL
SPECIFIC GRAVITY, URINE: 1.015
Urobilinogen, Ur: 3.5
pH, UA: 7 (ref 5.0–8.0)

## 2018-11-14 LAB — POCT INFLUENZA A/B
Influenza A, POC: POSITIVE — AB
Influenza B, POC: NEGATIVE

## 2018-11-14 MED ORDER — SULFAMETHOXAZOLE-TRIMETHOPRIM 800-160 MG PO TABS: 1.0000 | ORAL_TABLET | Freq: Two times a day (BID) | ORAL | 0 refills | Status: DC

## 2018-11-14 MED ORDER — OSELTAMIVIR PHOSPHATE 75 MG PO CAPS: 75.0000 mg | ORAL_CAPSULE | Freq: Two times a day (BID) | ORAL | 0 refills | Status: DC

## 2018-11-14 NOTE — Progress Notes (Signed)
   Subjective:    Patient ID: Allison [name], female    DOB: Aug 21, 1941, 78 y.o.   MRN: [id number]  HPI She complains of a 1 day history of backache, dysuria, fever, chills.  She has a previous history of UTI and was treated for 5 days approximately 1 month ago.   Review of Systems     Objective:   Physical Exam Alert and in no distress. Tympanic membranes and canals are normal. Pharyngeal area is normal. Neck is supple without adenopathy or thyromegaly. Cardiac exam shows a regular sinus rhythm without murmurs or gallops. Lungs are clear to auscultation. Urine dipstick was positive for white cells. Flu test was also positive.       Assessment & Plan:  Acute cystitis without hematuria - Plan: sulfamethoxazole-trimethoprim (BACTRIM DS,SEPTRA DS) 800-160 MG tablet  UTI symptoms - Plan: POCT Urinalysis DIP (Proadvantage Device)  Fever and chills - Plan: Influenza A/B, POC Influenza A&B(BINAX/QUICKVUE)  Influenza A - Plan: oseltamivir (TAMIFLU) 75 MG capsule, POC Influenza A&B(BINAX/QUICKVUE) I will treat her for 10 days for the UTI and also give Tamiflu.  Recommend Tylenol for the aches and pains.  She will call if further difficulty.

## 2018-11-22 ENCOUNTER — Encounter: Payer: Self-pay | Admitting: Medical

## 2018-11-22 ENCOUNTER — Ambulatory Visit (INDEPENDENT_AMBULATORY_CARE_PROVIDER_SITE_OTHER): Payer: PPO | Admitting: Medical

## 2018-11-22 VITALS — BP 150/76 | HR 72 | Temp 97.6°F | Ht 66.0 in | Wt 180.6 lb

## 2018-11-22 DIAGNOSIS — M545 Low back pain, unspecified: Secondary | ICD-10-CM

## 2018-11-22 DIAGNOSIS — R351 Nocturia: Secondary | ICD-10-CM | POA: Diagnosis not present

## 2018-11-22 DIAGNOSIS — N3281 Overactive bladder: Secondary | ICD-10-CM | POA: Diagnosis not present

## 2018-11-22 DIAGNOSIS — R10819 Abdominal tenderness, unspecified site: Secondary | ICD-10-CM | POA: Diagnosis not present

## 2018-11-22 DIAGNOSIS — M48062 Spinal stenosis, lumbar region with neurogenic claudication: Secondary | ICD-10-CM

## 2018-11-22 LAB — POCT URINALYSIS DIP (PROADVANTAGE DEVICE)
Bilirubin, UA: NEGATIVE
Blood, UA: NEGATIVE
Glucose, UA: NEGATIVE mg/dL
Ketones, POC UA: NEGATIVE mg/dL
Leukocytes, UA: NEGATIVE
Nitrite, UA: NEGATIVE
Protein Ur, POC: NEGATIVE mg/dL
SPECIFIC GRAVITY, URINE: 1.02
Urobilinogen, Ur: NEGATIVE
pH, UA: 6 (ref 5.0–8.0)

## 2018-11-22 MED ORDER — MIRABEGRON ER 25 MG PO TB24: 25.0000 mg | ORAL_TABLET | Freq: Every day | ORAL | 0 refills | Status: DC

## 2018-11-22 NOTE — Progress Notes (Signed)
Subjective: Chief Complaint  Patient presents with  . Back Pain    possible kidney stone  . Nausea   Here for not feeling well.  She was seen here 11/14/18 with Dr. Redmond School for UTI.   finished bactrim.  She has been having back pain and nausea, today.  Was having back pain in past few weeks with the UTI.  Was doing some better, but then today walking across the yard felt some pain in left buttock/low back region.  Pain has been intermittent.   At one point, used 1 OxyContin left over from wrist surgery.  No blood in urine, but been having months of nocturia every 2 hours, often urinary frequency during day, no leakage or incontinence.  She denies burning with urination.  No bowel issues.  No blood in stool.  No problems with stool. Has had kidney stones years ago.  No pain in upper back or abdomen.  No pains down legs.  No leg paresthesias.  No leg swelling.   She recently was prescribed oxybutynin but says that did not help and after taking this for more than a week.    She has hx/o back pain, abnormal MRI of back, and had steroid injection 01/2017 from neurosurgery regarding back pain.    No other aggravating or relieving factors. No other complaint.  Past Medical History:  Diagnosis Date  . Allergy    RHINITIS  . Arthritis    lower back  . Former smoker   . GERD (gastroesophageal reflux disease)   . History of blood transfusion 1960   after devlivery of son  . History of kidney stones    lithrotripsy  . Hypercholesterolemia   . Hypertension   . Hypothyroid   . Macular degeneration    bilateral  . Tachycardia    Current Outpatient Medications on File Prior to Visit  Medication Sig Dispense Refill  . aspirin 81 MG tablet Take 81 mg by mouth daily.    . beta carotene w/minerals (OCUVITE) tablet Take 2 tablets by mouth daily.     Marland Kitchen co-enzyme Q-10 30 MG capsule Take 30 mg by mouth daily.      Marland Kitchen levothyroxine (SYNTHROID, LEVOTHROID) 75 MCG tablet Take 1 tablet (75 mcg total) by  mouth daily. 90 tablet 3  . losartan-hydrochlorothiazide (HYZAAR) 100-25 MG tablet Take 1 tablet by mouth daily. 90 tablet 3  . Multiple Vitamins-Minerals (MULTIVITAMIN ADULT PO) Take 1 capsule by mouth 2 (two) times daily.    Marland Kitchen omeprazole (PRILOSEC) 20 MG capsule Take 1 capsule (20 mg total) by mouth daily. 90 capsule 3  . simvastatin (ZOCOR) 40 MG tablet Take 1 tablet (40 mg total) by mouth daily. 90 tablet 3  . zolpidem (AMBIEN) 5 MG tablet Take 1 tablet (5 mg total) by mouth at bedtime as needed for sleep. (Patient not taking: Reported on 11/22/2018) 90 tablet 0   No current facility-administered medications on file prior to visit.    ROS as in subjective   Objective: BP (!) 150/76   Pulse 72   Temp 97.6 F (36.4 C) (Oral)   Ht 5\' 6"  (1.676 m)   Wt 180 lb 9.6 oz (81.9 kg)   SpO2 94%   BMI 29.15 kg/m   General appearance: alert, no distress, WD/WN,  Abdomen: +bs, soft, mild generalized tenderness,  non distended, no masses, no hepatomegaly, no splenomegaly Back: non tender, some pain in left low back with ROM which is about 85% of normal.   No deformity  or swelling, no rash MSK: legs and hips nontender, normal hip ROM Neuro:  Normal heel and toe walk, -SLR, normal strength and sensation of legs Pulses: 2+ symmetric, upper and lower extremities, normal cap refill   MRI L spine 02/14/2017 IMPRESSION: 1. Multilevel lumbar disc and facet degeneration, worst at L2-3 where there is severe spinal stenosis and moderate left foraminal stenosis. 2. Moderate lateral recess and foraminal stenosis at L4-5. 3. Moderate to severe left foraminal stenosis at L5-S1.    Assessment: Encounter Diagnoses  Name Primary?  . Acute left-sided low back pain, unspecified whether sciatica present Yes  . Abdominal tenderness, rebound tenderness presence not specified, unspecified location   . Nocturia   . Overactive bladder   . Spinal stenosis of lumbar region with neurogenic claudication       Plan: Discussed symptoms, concerns, possible causes.  She likely has OAB vs other urinary dysfunction.  Failed oxybutynin.  Begin trial of Myrbetriq.  Discussed risks/benefits of medication.  If not improving within 2 weeks assuming her current low back and buttock pain symptoms improve, then consider urology consult vs CT scan of abdomen/pelvis.  She does have abnormal MRI as above from 01/2017. S/p EDSI 01/2017.  Some of her symptoms could be radicular issues related to abnormal lumbar spinal findings from 01/2017.  She has some pain medication left over she will use tonight and tomorrow.  F/u pending labs below   Allison [name] was seen today for back pain and nausea.  Diagnoses and all orders for this visit:  Acute left-sided low back pain, unspecified whether sciatica present -     POCT Urinalysis DIP (Proadvantage Device)  Abdominal tenderness, rebound tenderness presence not specified, unspecified location -     Basic metabolic panel  Nocturia -     Basic metabolic panel  Overactive bladder -     Basic metabolic panel  Spinal stenosis of lumbar region with neurogenic claudication  Other orders -     mirabegron ER (MYRBETRIQ) 25 MG TB24 tablet; Take 1 tablet (25 mg total) by mouth daily.

## 2018-11-23 ENCOUNTER — Other Ambulatory Visit: Payer: Self-pay | Admitting: Medical

## 2018-11-23 DIAGNOSIS — N289 Disorder of kidney and ureter, unspecified: Secondary | ICD-10-CM

## 2018-11-23 LAB — BASIC METABOLIC PANEL
BUN/Creatinine Ratio: 12 (ref 12–28)
BUN: 26 mg/dL (ref 8–27)
CO2: 22 mmol/L (ref 20–29)
Calcium: 10.6 mg/dL — ABNORMAL HIGH (ref 8.7–10.3)
Chloride: 99 mmol/L (ref 96–106)
Creatinine, Ser: 2.1 mg/dL — ABNORMAL HIGH (ref 0.57–1.00)
GFR calc Af Amer: 25 mL/min/{1.73_m2} — ABNORMAL LOW (ref >59)
GFR calc non Af Amer: 22 mL/min/{1.73_m2} — ABNORMAL LOW (ref >59)
GLUCOSE: 103 mg/dL — AB (ref 65–99)
Potassium: 5.5 mmol/L — ABNORMAL HIGH (ref 3.5–5.2)
Sodium: 137 mmol/L (ref 134–144)

## 2018-11-23 MED ORDER — MIRABEGRON ER 25 MG PO TB24: 25.0000 mg | ORAL_TABLET | Freq: Every day | ORAL | 2 refills | Status: DC

## 2018-11-29 ENCOUNTER — Other Ambulatory Visit: Payer: PPO

## 2018-11-29 DIAGNOSIS — N289 Disorder of kidney and ureter, unspecified: Secondary | ICD-10-CM

## 2018-11-30 ENCOUNTER — Other Ambulatory Visit: Payer: Self-pay | Admitting: Medical

## 2018-11-30 DIAGNOSIS — I10 Essential (primary) hypertension: Secondary | ICD-10-CM

## 2018-11-30 LAB — BASIC METABOLIC PANEL
BUN/Creatinine Ratio: 20 (ref 12–28)
BUN: 22 mg/dL (ref 8–27)
CO2: 24 mmol/L (ref 20–29)
Calcium: 10.2 mg/dL (ref 8.7–10.3)
Chloride: 101 mmol/L (ref 96–106)
Creatinine, Ser: 1.09 mg/dL — ABNORMAL HIGH (ref 0.57–1.00)
GFR calc non Af Amer: 48 mL/min/{1.73_m2} — ABNORMAL LOW (ref >59)
GFR, EST AFRICAN AMERICAN: 56 mL/min/{1.73_m2} — AB (ref >59)
Glucose: 93 mg/dL (ref 65–99)
Potassium: 5.1 mmol/L (ref 3.5–5.2)
Sodium: 141 mmol/L (ref 134–144)

## 2018-11-30 MED ORDER — LOSARTAN POTASSIUM-HCTZ 100-25 MG PO TABS: 1.0000 | ORAL_TABLET | Freq: Every day | ORAL | 3 refills | Status: DC

## 2018-12-01 ENCOUNTER — Telehealth: Payer: Self-pay | Admitting: Medical

## 2018-12-01 ENCOUNTER — Other Ambulatory Visit: Payer: Self-pay | Admitting: Medical

## 2018-12-01 DIAGNOSIS — I10 Essential (primary) hypertension: Secondary | ICD-10-CM

## 2018-12-01 DIAGNOSIS — N289 Disorder of kidney and ureter, unspecified: Secondary | ICD-10-CM

## 2018-12-01 NOTE — Telephone Encounter (Signed)
Pt called and stated that she was to stop bp meds until she had labs. Those were done Wednesday and she sees the results on mychart. She would like to know if she needs to start bp meds up. Pt can be reached at [phone].

## 2018-12-14 ENCOUNTER — Other Ambulatory Visit: Payer: PPO

## 2018-12-14 DIAGNOSIS — N289 Disorder of kidney and ureter, unspecified: Secondary | ICD-10-CM | POA: Diagnosis not present

## 2018-12-14 DIAGNOSIS — I10 Essential (primary) hypertension: Secondary | ICD-10-CM | POA: Diagnosis not present

## 2018-12-15 LAB — BASIC METABOLIC PANEL
BUN/Creatinine Ratio: 16 (ref 12–28)
BUN: 18 mg/dL (ref 8–27)
CO2: 26 mmol/L (ref 20–29)
Calcium: 10 mg/dL (ref 8.7–10.3)
Chloride: 98 mmol/L (ref 96–106)
Creatinine, Ser: 1.12 mg/dL — ABNORMAL HIGH (ref 0.57–1.00)
GFR calc Af Amer: 54 mL/min/{1.73_m2} — ABNORMAL LOW (ref >59)
GFR calc non Af Amer: 47 mL/min/{1.73_m2} — ABNORMAL LOW (ref >59)
Glucose: 121 mg/dL — ABNORMAL HIGH (ref 65–99)
Potassium: 4.5 mmol/L (ref 3.5–5.2)
Sodium: 144 mmol/L (ref 134–144)

## 2019-01-31 ENCOUNTER — Other Ambulatory Visit: Payer: Self-pay

## 2019-01-31 ENCOUNTER — Telehealth: Payer: Self-pay | Admitting: Family Medicine

## 2019-01-31 DIAGNOSIS — I10 Essential (primary) hypertension: Secondary | ICD-10-CM

## 2019-01-31 MED ORDER — LOSARTAN POTASSIUM-HCTZ 100-25 MG PO TABS: 1.0000 | ORAL_TABLET | Freq: Every day | ORAL | 3 refills | Status: DC

## 2019-01-31 NOTE — Telephone Encounter (Signed)
Pt left message that Losartan is out of stock at [facility] and Anders Simmonds has it on [facility] and would like it sent in for 90 days just this time

## 2019-01-31 NOTE — Telephone Encounter (Signed)
Pt was called and advised. [place]

## 2019-01-31 NOTE — Telephone Encounter (Signed)
Pt called back Rx still not at pharmacy,  Looks like it was put in as no print and so I called into [facility] and they hadn't gotten a call for the prescription so I called it in

## 2019-02-01 MED ORDER — LOSARTAN POTASSIUM-HCTZ 100-25 MG PO TABS: 1.0000 | ORAL_TABLET | Freq: Every day | ORAL | 0 refills | Status: DC

## 2019-02-01 NOTE — Telephone Encounter (Signed)
Sent to [facility] for 90 day and let patient know. Asked her to please call me of she any issues.

## 2019-02-10 NOTE — Telephone Encounter (Signed)
done

## 2019-03-01 ENCOUNTER — Other Ambulatory Visit: Payer: Self-pay

## 2019-03-01 ENCOUNTER — Ambulatory Visit: Payer: PPO | Admitting: Orthopaedic Surgery

## 2019-03-01 ENCOUNTER — Encounter: Payer: Self-pay | Admitting: Orthopaedic Surgery

## 2019-03-01 DIAGNOSIS — M65331 Trigger finger, right middle finger: Secondary | ICD-10-CM | POA: Diagnosis not present

## 2019-03-01 MED ORDER — METHYLPREDNISOLONE ACETATE 40 MG/ML IJ SUSP
20.0000 mg | INTRAMUSCULAR | Status: AC | PRN
  Administered 2019-03-01: 20 mg

## 2019-03-01 MED ORDER — LIDOCAINE HCL 1 % IJ SOLN
0.5000 mL | INTRAMUSCULAR | Status: AC | PRN
  Administered 2019-03-01: .5 mL

## 2019-03-01 NOTE — Progress Notes (Signed)
Office Visit Note   Patient: Allison [name]           Date of Birth: 10/16/1941           MRN: [id number] Visit Date: 03/01/2019              Requested by: Denita Lung, MD [place], [place] [address] PCP: Denita Lung, MD   Assessment & Plan: Visit Diagnoses:  1. Trigger finger, right middle finger     Plan: Per her wishes I did provide a steroid injection at the A1 pulley of the right middle finger which she tolerated well.  All question concerns were answered and addressed.  Follow-up will be as needed.  This will have been the third injection provided in this area.  We can still keep doing this unless she decides she wants to have surgery at any point.  Follow-Up Instructions: Return if symptoms worsen or fail to improve.   Orders:  Orders Placed This Encounter  Procedures  . Hand/UE Inj   No orders of the defined types were placed in this encounter.     Procedures: Hand/UE Inj: R long A1 for trigger finger on 03/01/2019 2:28 PM Medications: 0.5 mL lidocaine 1 %; 20 mg methylPREDNISolone acetate 40 MG/ML      Clinical Data: No additional findings.   Subjective: Chief Complaint  Patient presents with  . trigger finger  The patient is a 78 year old female that I seen before for the same issue.  She has had a history of a right middle finger trigger finger.  We have provided at least 2 injections at the A1 pulley of the right middle finger.  He said each time she had injection is worked for a long period of time but then it started trigger again.  She is interested in another injection today.  She is not a diabetic.  She has had no other acute change in her medical status.  She would still rather try injections as opposed to surgery.  HPI  Review of Systems She currently denies any headache, chest pain, shortness of breath, fever, chills, nausea, vomiting  Objective: Vital Signs: There were no vitals taken for this visit.  Physical  Exam She is alert and orient x3 and in no acute distress Ortho Exam Examination of her right hand does show active triggering of the right middle finger with pain at the A1 pulley.  Her hand exam is otherwise normal. Specialty Comments:  No specialty comments available.  Imaging: No results found.   PMFS History: Patient Active Problem List   Diagnosis Date Noted  . Spinal stenosis of lumbar region with neurogenic claudication 03/11/2017  . History of kidney stones 12/04/2015  . Arthritis 12/04/2015  . Hypothyroidism 12/04/2015  . HYPERCHOLESTEROLEMIA 08/05/2009  . Essential hypertension 08/05/2009  . GERD 08/05/2009   Past Medical History:  Diagnosis Date  . Allergy    RHINITIS  . Arthritis    lower back  . Former smoker   . GERD (gastroesophageal reflux disease)   . History of blood transfusion 1960   after devlivery of son  . History of kidney stones    lithrotripsy  . Hypercholesterolemia   . Hypertension   . Hypothyroid   . Macular degeneration    bilateral  . Tachycardia     Family History  Problem Relation Age of Onset  . Arthritis Mother   . Diabetes Mother   . Arthritis Father   . Diabetes Father   .  Hyperlipidemia Father   . Colon cancer Brother 88  . Colon polyps Brother 67  . Cancer Paternal Grandmother   . Heart failure Sister   . Hyperlipidemia Sister   . Rectal cancer Neg Hx   . Stomach cancer Neg Hx   . Esophageal cancer Neg Hx     Past Surgical History:  Procedure Laterality Date  . ABDOMINAL HYSTERECTOMY    . APPENDECTOMY    . cataract surgery Bilateral 03/2014   polyps  . COLONOSCOPY  02/2010   Social History   Occupational History  . Not on file  Tobacco Use  . Smoking status: Former Smoker    Packs/day: 1.00    Years: 20.00    Pack years: 20.00    Types: Cigarettes    Last attempt to quit: 10/24/1993    Years since quitting: 25.3  . Smokeless tobacco: Never Used  Substance and Sexual Activity  . Alcohol use: Yes     Alcohol/week: 5.0 standard drinks    Types: 5 Glasses of wine per week    Comment: 4-5 glasses of wine per week.   . Drug use: No  . Sexual activity: Yes    Birth control/protection: Post-menopausal

## 2019-04-19 ENCOUNTER — Ambulatory Visit: Payer: PPO | Admitting: Family Medicine

## 2019-04-23 ENCOUNTER — Encounter: Payer: Self-pay | Admitting: Family Medicine

## 2019-04-23 ENCOUNTER — Other Ambulatory Visit: Payer: Self-pay

## 2019-04-23 ENCOUNTER — Ambulatory Visit (INDEPENDENT_AMBULATORY_CARE_PROVIDER_SITE_OTHER): Payer: PPO | Admitting: Family Medicine

## 2019-04-23 ENCOUNTER — Telehealth: Payer: Self-pay

## 2019-04-23 VITALS — Temp 97.7°F | Wt 180.0 lb

## 2019-04-23 DIAGNOSIS — R300 Dysuria: Secondary | ICD-10-CM | POA: Diagnosis not present

## 2019-04-23 DIAGNOSIS — N3001 Acute cystitis with hematuria: Secondary | ICD-10-CM

## 2019-04-23 LAB — POCT URINALYSIS DIP (PROADVANTAGE DEVICE)
Glucose, UA: NEGATIVE mg/dL
Ketones, POC UA: NEGATIVE mg/dL
Nitrite, UA: NEGATIVE
Protein Ur, POC: NEGATIVE mg/dL
Specific Gravity, Urine: 1.01
Urobilinogen, Ur: 0.02
pH, UA: 6 (ref 5.0–8.0)

## 2019-04-23 MED ORDER — SULFAMETHOXAZOLE-TRIMETHOPRIM 400-80 MG PO TABS: 1.0000 | ORAL_TABLET | Freq: Two times a day (BID) | ORAL | 0 refills | Status: DC

## 2019-04-23 NOTE — Progress Notes (Signed)
   Subjective:    Patient ID: Allison [name], female    DOB: 15-Apr-1941, 78 y.o.   MRN: [id number]  HPI Documentation for virtual telephone encounter. Interactive audio and video telecommunications were attempted between this provider and patient, however  she did not have access to video capability.  We continued and completed visit with audio only. The patient was located at home. The provider was located in the office. The patient did consent to this visit and is aware of possible charges through their insurance for this visit. The other persons participating in this telemedicine service were none. Time spent on call was 5 minutes and in review of previous records >15 minutes total. This virtual service is not related to other E/M service within previous 7 days. She complains of a several day history of frequency, urgency, dysuria and seeing brownish urine.  No fever chills or abdominal pain. She also states that she gives blood regularly and was told her hemoglobin was low.  She then substituted adding more iron to her diet in terms of a multivitamin and brought her hemoglobin from 11.1 up to 13.    Review of Systems     Objective:   Physical Exam Alert and in no distress Urine microscopic was positive for red cells.      Assessment & Plan:   Encounter Diagnoses  Name Primary?  . Dysuria   . Acute cystitis with hematuria Yes  Treat with Septra.  She is to return here in 2 weeks for a recheck.

## 2019-04-23 NOTE — Telephone Encounter (Signed)
Called pt to advise to call and schedule a follow up appointment from U/A . [facility]

## 2019-05-10 ENCOUNTER — Other Ambulatory Visit: Payer: Self-pay | Admitting: Internal Medicine

## 2019-05-10 DIAGNOSIS — Z87448 Personal history of other diseases of urinary system: Secondary | ICD-10-CM

## 2019-05-11 ENCOUNTER — Other Ambulatory Visit (INDEPENDENT_AMBULATORY_CARE_PROVIDER_SITE_OTHER): Payer: PPO

## 2019-05-11 DIAGNOSIS — Z87448 Personal history of other diseases of urinary system: Secondary | ICD-10-CM | POA: Diagnosis not present

## 2019-05-11 LAB — POCT URINALYSIS DIP (PROADVANTAGE DEVICE)
Bilirubin, UA: NEGATIVE
Glucose, UA: NEGATIVE mg/dL
Nitrite, UA: NEGATIVE
Protein Ur, POC: NEGATIVE mg/dL
Specific Gravity, Urine: 1.015
Urobilinogen, Ur: NEGATIVE
pH, UA: 6 (ref 5.0–8.0)

## 2019-05-14 ENCOUNTER — Telehealth: Payer: Self-pay

## 2019-05-14 DIAGNOSIS — I10 Essential (primary) hypertension: Secondary | ICD-10-CM

## 2019-05-14 MED ORDER — LOSARTAN POTASSIUM-HCTZ 100-25 MG PO TABS: 1.0000 | ORAL_TABLET | Freq: Every day | ORAL | 0 refills | Status: DC

## 2019-05-14 NOTE — Telephone Encounter (Signed)
Pt called and stated she needs a refill on her Losartan.

## 2019-05-15 ENCOUNTER — Telehealth: Payer: Self-pay

## 2019-05-15 ENCOUNTER — Telehealth: Payer: Self-pay | Admitting: Family Medicine

## 2019-05-15 DIAGNOSIS — N3001 Acute cystitis with hematuria: Secondary | ICD-10-CM

## 2019-05-15 MED ORDER — CIPROFLOXACIN HCL 500 MG PO TABS: 500.0000 mg | ORAL_TABLET | Freq: Two times a day (BID) | ORAL | 0 refills | Status: DC

## 2019-05-15 NOTE — Telephone Encounter (Signed)
She states that she is feeling better but not quite back to normal.  Her urinalysis was still positive for red cells.  I will switch her to Cipro.  She will return here in 2 weeks for repeat urinalysis and if still positive, refer to urology

## 2019-05-15 NOTE — Telephone Encounter (Signed)
Pt was advised and done [facility]

## 2019-05-15 NOTE — Telephone Encounter (Signed)
Pt called about her U/A results from last week please advised due to blood, luekocytes and ketones. Please advise. [place]

## 2019-05-15 NOTE — Telephone Encounter (Signed)
Schedule for urinalysis in 2 weeks

## 2019-05-15 NOTE — Telephone Encounter (Signed)
Pt left voice mail that she called earlier for urine results and was told not read yet. She is calling again for urine results..  Please advise pt [phone]

## 2019-05-31 ENCOUNTER — Other Ambulatory Visit (INDEPENDENT_AMBULATORY_CARE_PROVIDER_SITE_OTHER): Payer: PPO | Admitting: Family Medicine

## 2019-05-31 ENCOUNTER — Other Ambulatory Visit: Payer: Self-pay

## 2019-05-31 DIAGNOSIS — N3001 Acute cystitis with hematuria: Secondary | ICD-10-CM

## 2019-05-31 LAB — POCT URINALYSIS DIP (PROADVANTAGE DEVICE)
Bilirubin, UA: NEGATIVE
Glucose, UA: NEGATIVE mg/dL
Ketones, POC UA: NEGATIVE mg/dL
Nitrite, UA: NEGATIVE
Protein Ur, POC: 30 mg/dL — AB
Specific Gravity, Urine: 1.015
Urobilinogen, Ur: NEGATIVE
pH, UA: 7 (ref 5.0–8.0)

## 2019-05-31 NOTE — Progress Notes (Signed)
   Subjective:    Patient ID: Allison [name], female    DOB: 06/03/1941, 77 y.o.   MRN: [id number]  HPI She is here for a recheck.  She was treated for UTI in June and again in July.  She is here for recheck because of continued difficulty with hematuria.  No fever or chills, frequency or dysuria.   Review of Systems     Objective:   Physical Exam Alert and in no distress.  Urine was grossly red.      Assessment & Plan:

## 2019-06-01 ENCOUNTER — Other Ambulatory Visit: Payer: Self-pay

## 2019-06-01 ENCOUNTER — Telehealth: Payer: Self-pay | Admitting: Family Medicine

## 2019-06-01 DIAGNOSIS — K219 Gastro-esophageal reflux disease without esophagitis: Secondary | ICD-10-CM

## 2019-06-01 LAB — URINE CULTURE

## 2019-06-01 MED ORDER — OMEPRAZOLE 20 MG PO CPDR: 20.0000 mg | DELAYED_RELEASE_CAPSULE | Freq: Every day | ORAL | 3 refills | Status: DC

## 2019-06-01 NOTE — Telephone Encounter (Signed)
Done K[facility] 

## 2019-06-01 NOTE — Telephone Encounter (Signed)
Recv'd fax refill request from [facility] mail order for 90 day Omeprazole 20 mg

## 2019-06-06 DIAGNOSIS — R3121 Asymptomatic microscopic hematuria: Secondary | ICD-10-CM | POA: Diagnosis not present

## 2019-06-06 DIAGNOSIS — R350 Frequency of micturition: Secondary | ICD-10-CM | POA: Diagnosis not present

## 2019-06-06 DIAGNOSIS — R351 Nocturia: Secondary | ICD-10-CM | POA: Diagnosis not present

## 2019-06-08 DIAGNOSIS — R3121 Asymptomatic microscopic hematuria: Secondary | ICD-10-CM | POA: Diagnosis not present

## 2019-06-08 DIAGNOSIS — N1330 Unspecified hydronephrosis: Secondary | ICD-10-CM | POA: Diagnosis not present

## 2019-06-13 DIAGNOSIS — R350 Frequency of micturition: Secondary | ICD-10-CM | POA: Diagnosis not present

## 2019-06-13 DIAGNOSIS — R3121 Asymptomatic microscopic hematuria: Secondary | ICD-10-CM | POA: Diagnosis not present

## 2019-06-13 DIAGNOSIS — R351 Nocturia: Secondary | ICD-10-CM | POA: Diagnosis not present

## 2019-07-10 DIAGNOSIS — N3020 Other chronic cystitis without hematuria: Secondary | ICD-10-CM | POA: Diagnosis not present

## 2019-07-10 DIAGNOSIS — N130 Hydronephrosis with ureteropelvic junction obstruction: Secondary | ICD-10-CM | POA: Diagnosis not present

## 2019-07-13 ENCOUNTER — Other Ambulatory Visit: Payer: Self-pay

## 2019-07-13 ENCOUNTER — Telehealth: Payer: Self-pay | Admitting: Family Medicine

## 2019-07-13 DIAGNOSIS — E7800 Pure hypercholesterolemia, unspecified: Secondary | ICD-10-CM

## 2019-07-13 MED ORDER — SIMVASTATIN 40 MG PO TABS: 40.0000 mg | ORAL_TABLET | Freq: Every day | ORAL | 3 refills | Status: DC

## 2019-07-13 NOTE — Telephone Encounter (Signed)
Done and pt advised [facility] 

## 2019-07-13 NOTE — Telephone Encounter (Signed)
Pharmacy sent refill request for simvastatin 40 mg tablet please send to [facility] - [place], [place]

## 2019-07-16 ENCOUNTER — Other Ambulatory Visit: Payer: Self-pay | Admitting: Urology

## 2019-07-16 ENCOUNTER — Other Ambulatory Visit: Payer: Self-pay

## 2019-07-16 ENCOUNTER — Encounter (HOSPITAL_BASED_OUTPATIENT_CLINIC_OR_DEPARTMENT_OTHER): Payer: Self-pay

## 2019-07-16 ENCOUNTER — Other Ambulatory Visit (HOSPITAL_COMMUNITY)
Admission: RE | Admit: 2019-07-16 | Discharge: 2019-07-16 | Disposition: A | Discharge status: Home / Self Care | Payer: PPO | Source: Ambulatory Visit | Attending: Urology | Admitting: Urology

## 2019-07-16 DIAGNOSIS — Z01812 Encounter for preprocedural laboratory examination: Secondary | ICD-10-CM | POA: Insufficient documentation

## 2019-07-16 DIAGNOSIS — Z20828 Contact with and (suspected) exposure to other viral communicable diseases: Secondary | ICD-10-CM | POA: Insufficient documentation

## 2019-07-16 LAB — SARS CORONAVIRUS 2 (TAT 6-24 HRS): SARS Coronavirus 2: NEGATIVE

## 2019-07-16 NOTE — Progress Notes (Signed)
Spoke with: Vaughan Basta NPO:  No food after midnight/Clear liquids until 6:30AM DOS Arrival time: 1030AM Labs: Istat 8, EKG AM medications: Levothyroxine, Omeprazole, Simvastatin, Keflex Pre op orders: Needs second sign Ride home: Laverna Peace (husband) [phone]

## 2019-07-16 NOTE — Progress Notes (Signed)
SPOKE W/  Allison [name]     SCREENING SYMPTOMS OF COVID 19:   COUGH--NO  RUNNY NOSE--- NO  SORE THROAT---NO  NASAL CONGESTION----NO  SNEEZING----NO  SHORTNESS OF BREATH---NO  DIFFICULTY BREATHING---NO  TEMP >100.0 -----NO  UNEXPLAINED BODY ACHES------NO  CHILLS -------- NO  HEADACHES ---------NO  LOSS OF SMELL/ TASTE --------NO    HAVE YOU OR ANY FAMILY MEMBER TRAVELLED PAST 14 DAYS OUT OF THE   COUNTY---NO STATE----NO COUNTRY----NO  HAVE YOU OR ANY FAMILY MEMBER BEEN EXPOSED TO ANYONE WITH COVID 19? NO

## 2019-07-18 ENCOUNTER — Ambulatory Visit (HOSPITAL_BASED_OUTPATIENT_CLINIC_OR_DEPARTMENT_OTHER): Payer: PPO | Admitting: Anesthesiology

## 2019-07-18 ENCOUNTER — Ambulatory Visit (HOSPITAL_BASED_OUTPATIENT_CLINIC_OR_DEPARTMENT_OTHER)
Admission: RE | Admit: 2019-07-18 | Discharge: 2019-07-18 | Disposition: A | Discharge status: Home / Self Care | Payer: PPO | Attending: Urology | Admitting: Urology

## 2019-07-18 ENCOUNTER — Encounter (HOSPITAL_BASED_OUTPATIENT_CLINIC_OR_DEPARTMENT_OTHER)
Admission: RE | Disposition: A | Discharge status: Home / Self Care | Payer: Self-pay | Source: Home / Self Care | Attending: Urology

## 2019-07-18 ENCOUNTER — Encounter (HOSPITAL_BASED_OUTPATIENT_CLINIC_OR_DEPARTMENT_OTHER): Payer: Self-pay

## 2019-07-18 DIAGNOSIS — N3289 Other specified disorders of bladder: Secondary | ICD-10-CM | POA: Diagnosis not present

## 2019-07-18 DIAGNOSIS — I10 Essential (primary) hypertension: Secondary | ICD-10-CM | POA: Insufficient documentation

## 2019-07-18 DIAGNOSIS — K219 Gastro-esophageal reflux disease without esophagitis: Secondary | ICD-10-CM | POA: Diagnosis not present

## 2019-07-18 DIAGNOSIS — N2889 Other specified disorders of kidney and ureter: Secondary | ICD-10-CM | POA: Diagnosis not present

## 2019-07-18 DIAGNOSIS — Z87440 Personal history of urinary (tract) infections: Secondary | ICD-10-CM | POA: Diagnosis not present

## 2019-07-18 DIAGNOSIS — N3030 Trigonitis without hematuria: Secondary | ICD-10-CM | POA: Insufficient documentation

## 2019-07-18 DIAGNOSIS — Z1159 Encounter for screening for other viral diseases: Secondary | ICD-10-CM | POA: Diagnosis not present

## 2019-07-18 DIAGNOSIS — Z87891 Personal history of nicotine dependence: Secondary | ICD-10-CM | POA: Diagnosis not present

## 2019-07-18 DIAGNOSIS — N3020 Other chronic cystitis without hematuria: Secondary | ICD-10-CM | POA: Diagnosis not present

## 2019-07-18 DIAGNOSIS — D4121 Neoplasm of uncertain behavior of right ureter: Secondary | ICD-10-CM | POA: Diagnosis not present

## 2019-07-18 DIAGNOSIS — N131 Hydronephrosis with ureteral stricture, not elsewhere classified: Secondary | ICD-10-CM | POA: Diagnosis not present

## 2019-07-18 DIAGNOSIS — N136 Pyonephrosis: Secondary | ICD-10-CM | POA: Diagnosis not present

## 2019-07-18 DIAGNOSIS — N1330 Unspecified hydronephrosis: Secondary | ICD-10-CM | POA: Diagnosis not present

## 2019-07-18 HISTORY — PX: CYSTOSCOPY/RETROGRADE/URETEROSCOPY: SHX5316

## 2019-07-18 HISTORY — DX: Personal history of other diseases of the nervous system and sense organs: Z86.69

## 2019-07-18 HISTORY — DX: Iron deficiency anemia, unspecified: D50.9

## 2019-07-18 HISTORY — DX: Personal history of other diseases of the digestive system: Z87.19

## 2019-07-18 HISTORY — DX: Mononeuropathy, unspecified: G58.9

## 2019-07-18 HISTORY — DX: Other seasonal allergic rhinitis: J30.2

## 2019-07-18 HISTORY — DX: Presence of spectacles and contact lenses: Z97.3

## 2019-07-18 LAB — POCT I-STAT, CHEM 8
BUN: 22 mg/dL (ref 8–23)
Calcium, Ion: 1.31 mmol/L (ref 1.15–1.40)
Chloride: 105 mmol/L (ref 98–111)
Creatinine, Ser: 1.2 mg/dL — ABNORMAL HIGH (ref 0.44–1.00)
Glucose, Bld: 100 mg/dL — ABNORMAL HIGH (ref 70–99)
HCT: 39 % (ref 36.0–46.0)
Hemoglobin: 13.3 g/dL (ref 12.0–15.0)
Potassium: 3.6 mmol/L (ref 3.5–5.1)
Sodium: 143 mmol/L (ref 135–145)
TCO2: 26 mmol/L (ref 22–32)

## 2019-07-18 SURGERY — CYSTOSCOPY/RETROGRADE/URETEROSCOPY
Anesthesia: General | Laterality: Bilateral

## 2019-07-18 MED ORDER — LACTATED RINGERS IV SOLN
INTRAVENOUS | Status: DC
  Administered 2019-07-18: 09:00:00 via INTRAVENOUS
  Filled 2019-07-18: qty 1000

## 2019-07-18 MED ORDER — GENTAMICIN SULFATE 40 MG/ML IJ SOLN
5.0000 mg/kg | INTRAVENOUS | Status: AC
  Administered 2019-07-18: 350 mg via INTRAVENOUS
  Filled 2019-07-18: qty 8.75

## 2019-07-18 MED ORDER — KETOROLAC TROMETHAMINE 30 MG/ML IJ SOLN
INTRAMUSCULAR | Status: AC
  Filled 2019-07-18: qty 1

## 2019-07-18 MED ORDER — GENTAMICIN SULFATE 40 MG/ML IJ SOLN
5.0000 mg/kg | INTRAVENOUS | Status: DC
  Filled 2019-07-18: qty 10.25

## 2019-07-18 MED ORDER — ONDANSETRON HCL 4 MG/2ML IJ SOLN
INTRAMUSCULAR | Status: DC | PRN
  Administered 2019-07-18: 4 mg via INTRAVENOUS

## 2019-07-18 MED ORDER — ROCURONIUM BROMIDE 10 MG/ML (PF) SYRINGE
PREFILLED_SYRINGE | INTRAVENOUS | Status: AC
  Filled 2019-07-18: qty 10

## 2019-07-18 MED ORDER — DEXAMETHASONE SODIUM PHOSPHATE 10 MG/ML IJ SOLN
INTRAMUSCULAR | Status: DC | PRN
  Administered 2019-07-18: 5 mg via INTRAVENOUS

## 2019-07-18 MED ORDER — LIDOCAINE 2% (20 MG/ML) 5 ML SYRINGE
INTRAMUSCULAR | Status: AC
  Filled 2019-07-18: qty 5

## 2019-07-18 MED ORDER — SUCCINYLCHOLINE CHLORIDE 200 MG/10ML IV SOSY
PREFILLED_SYRINGE | INTRAVENOUS | Status: AC
  Filled 2019-07-18: qty 10

## 2019-07-18 MED ORDER — FENTANYL CITRATE (PF) 100 MCG/2ML IJ SOLN
25.0000 ug | INTRAMUSCULAR | Status: DC | PRN
  Filled 2019-07-18: qty 1

## 2019-07-18 MED ORDER — ARTIFICIAL TEARS OPHTHALMIC OINT
TOPICAL_OINTMENT | OPHTHALMIC | Status: AC
  Filled 2019-07-18: qty 3.5

## 2019-07-18 MED ORDER — PROPOFOL 10 MG/ML IV BOLUS
INTRAVENOUS | Status: DC | PRN
  Administered 2019-07-18: 120 mg via INTRAVENOUS

## 2019-07-18 MED ORDER — DEXAMETHASONE SODIUM PHOSPHATE 10 MG/ML IJ SOLN
INTRAMUSCULAR | Status: AC
  Filled 2019-07-18: qty 1

## 2019-07-18 MED ORDER — FENTANYL CITRATE (PF) 100 MCG/2ML IJ SOLN
INTRAMUSCULAR | Status: DC | PRN
  Administered 2019-07-18: 50 ug via INTRAVENOUS

## 2019-07-18 MED ORDER — PHENYLEPHRINE 40 MCG/ML (10ML) SYRINGE FOR IV PUSH (FOR BLOOD PRESSURE SUPPORT)
PREFILLED_SYRINGE | INTRAVENOUS | Status: DC | PRN
  Administered 2019-07-18: 80 ug via INTRAVENOUS

## 2019-07-18 MED ORDER — ONDANSETRON HCL 4 MG/2ML IJ SOLN
INTRAMUSCULAR | Status: AC
  Filled 2019-07-18: qty 2

## 2019-07-18 MED ORDER — FENTANYL CITRATE (PF) 100 MCG/2ML IJ SOLN
INTRAMUSCULAR | Status: AC
  Filled 2019-07-18: qty 2

## 2019-07-18 MED ORDER — IOHEXOL 300 MG/ML  SOLN
INTRAMUSCULAR | Status: DC | PRN
  Administered 2019-07-18: 12:00:00 10 mL via URETHRAL
  Administered 2019-07-18: 12:00:00 23 mL via URETHRAL

## 2019-07-18 MED ORDER — PROPOFOL 10 MG/ML IV BOLUS
INTRAVENOUS | Status: AC
  Filled 2019-07-18: qty 20

## 2019-07-18 MED ORDER — LIDOCAINE 2% (20 MG/ML) 5 ML SYRINGE
INTRAMUSCULAR | Status: DC | PRN
  Administered 2019-07-18: 40 mg via INTRAVENOUS

## 2019-07-18 MED ORDER — TRAMADOL HCL 50 MG PO TABS: 50.0000 mg | ORAL_TABLET | Freq: Four times a day (QID) | ORAL | 0 refills | Status: DC | PRN

## 2019-07-18 SURGICAL SUPPLY — 37 items
BAG DRAIN URO-CYSTO SKYTR STRL (DRAIN) ×3 IMPLANT
BAG DRN UROCATH (DRAIN) ×1
BASKET LASER NITINOL 1.9FR (BASKET) IMPLANT
BRUSH URET BIOPSY 3F (UROLOGICAL SUPPLIES) ×2 IMPLANT
BSKT STON RTRVL 120 1.9FR (BASKET)
CATH INTERMIT  6FR 70CM (CATHETERS) ×2 IMPLANT
CLOTH BEACON ORANGE TIMEOUT ST (SAFETY) ×3 IMPLANT
CONT SPEC 4OZ CLIKSEAL STRL BL (MISCELLANEOUS) ×2 IMPLANT
DRSG TELFA 3X8 NADH (GAUZE/BANDAGES/DRESSINGS) ×3 IMPLANT
FIBER LASER FLEXIVA 365 (UROLOGICAL SUPPLIES) IMPLANT
FIBER LASER TRAC TIP (UROLOGICAL SUPPLIES) IMPLANT
GLOVE BIO SURGEON STRL SZ 6.5 (GLOVE) ×1 IMPLANT
GLOVE BIO SURGEON STRL SZ7.5 (GLOVE) ×3 IMPLANT
GLOVE BIO SURGEONS STRL SZ 6.5 (GLOVE) ×1
GLOVE BIOGEL PI IND STRL 6 (GLOVE) IMPLANT
GLOVE BIOGEL PI INDICATOR 6 (GLOVE) ×4
GOWN STRL REUS W/ TWL XL LVL3 (GOWN DISPOSABLE) IMPLANT
GOWN STRL REUS W/TWL LRG LVL3 (GOWN DISPOSABLE) ×5 IMPLANT
GOWN STRL REUS W/TWL XL LVL3 (GOWN DISPOSABLE) ×3
GUIDEWIRE ANG ZIPWIRE 038X150 (WIRE) ×5 IMPLANT
GUIDEWIRE STR DUAL SENSOR (WIRE) ×5 IMPLANT
IV NS 1000ML (IV SOLUTION)
IV NS 1000ML BAXH (IV SOLUTION) ×1 IMPLANT
IV NS IRRIG 3000ML ARTHROMATIC (IV SOLUTION) ×3 IMPLANT
KIT TURNOVER CYSTO (KITS) ×3 IMPLANT
MANIFOLD NEPTUNE II (INSTRUMENTS) ×3 IMPLANT
NDL SAFETY ECLIPSE 18X1.5 (NEEDLE) IMPLANT
NEEDLE HYPO 18GX1.5 SHARP (NEEDLE) ×3
NS IRRIG 500ML POUR BTL (IV SOLUTION) ×4 IMPLANT
PACK CYSTO (CUSTOM PROCEDURE TRAY) ×3 IMPLANT
PAD DRESSING TELFA 3X8 NADH (GAUZE/BANDAGES/DRESSINGS) IMPLANT
STENT POLARIS 5FRX22 (STENTS) ×2 IMPLANT
SYR 10ML LL (SYRINGE) ×3 IMPLANT
TUBE CONNECTING 12'X1/4 (SUCTIONS) ×1
TUBE CONNECTING 12X1/4 (SUCTIONS) ×1 IMPLANT
TUBE FEEDING 8FR 16IN STR KANG (MISCELLANEOUS) ×2 IMPLANT
TUBING UROLOGY SET (TUBING) ×2 IMPLANT

## 2019-07-18 NOTE — Transfer of Care (Signed)
Immediate Anesthesia Transfer of Care Note  Patient: Allison [name]  Procedure(s) Performed: CYSTOSCOPY/RETROGRADE/RIGHT URETEROSCOPY/BLADDER BIOPSY/STENT PLACEMENT/RIGHT URETERAL MASS BIOPSY (Bilateral )  Patient Location: PACU  Anesthesia Type:General  Level of Consciousness: awake, alert , oriented and patient cooperative  Airway & Oxygen Therapy: Patient Spontanous Breathing and Patient connected to face mask oxygen  Post-op Assessment: Report given to RN and Post -op Vital signs reviewed and stable  Post vital signs: Reviewed and stable  Last Vitals:  Vitals Value Taken Time  BP 113/66 07/18/19 1238  Temp    Pulse 68 07/18/19 1239  Resp 14 07/18/19 1239  SpO2 100 % 07/18/19 1239  Vitals shown include unvalidated device data.  Last Pain:  Vitals:   07/18/19 0905  TempSrc: Oral  PainSc: 0-No pain      Patients Stated Pain Goal: 3 ([id number])  Complications: No apparent anesthesia complications

## 2019-07-18 NOTE — H&P (Signed)
Allison [name] is an 78 y.o. female.    Chief Complaint: Pre-Op RIGHT Ureteroscopy / biopsy  HPI:   1 - Recurrent Cystitis - few episodes per year of simple cystitis. Symptoms irritative voiding. No pyelo hospitalizations. UCX mixed x many / non-clonal. CT 2020 with Rt hydro as per below. PVR "83mL" (normal). No glycosuria.   2 - Right Hydronephrosis / Rule Out Ureteral Cancer - right moderate hydro with some cortical thinning down to enhancinc / thickened distal 1/3 of ureter by CT 2020. Cysto w/o overt bladder lesions, but difficult. Remote 20PY smoker. 1 artery / 2 vein (upper accessory) Rt renovascular anatomy. Cr 1.0, contralateral kidney normal.   PMH sig for mild obesity, TAH (precancerous), Appy. NO CV disease / blood thinners. 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 Her PCP is Jill Alexanders MD.   Today "Allison [name]" is seen to proceed with cysto and RIGHT ureteroscopy / biopsy to help rule out ureteral cancer. No interval fevers. She has been on keflex proph prior to surgery today. C19 screen negative.     Past Medical History:  Diagnosis Date  . Arthritis    lower back  . Former smoker   . GERD (gastroesophageal reflux disease)   . History of appendicitis   . History of blood transfusion 1960   after delivery of son  . History of cataract   . History of kidney stones 1999   lithrotripsy  . Hypercholesterolemia   . Hypertension   . Hypothyroid   . Iron deficiency anemia   . Macular degeneration    bilateral  . Pinched nerve    back  . Seasonal allergies    RHINITIS  . Tachycardia   . Wears glasses    reading    Past Surgical History:  Procedure Laterality Date  . ABDOMINAL HYSTERECTOMY    . APPENDECTOMY    . cataract surgery Bilateral 03/2014   polyps  . COLONOSCOPY  02/2010    Family History  Problem Relation Age of Onset  . Arthritis Mother   . Diabetes Mother   . Arthritis Father   . Diabetes Father   . Hyperlipidemia  Father   . Colon cancer Brother 87  . Colon polyps Brother 72  . Cancer Paternal Grandmother   . Heart failure Sister   . Hyperlipidemia Sister   . Rectal cancer Neg Hx   . Stomach cancer Neg Hx   . Esophageal cancer Neg Hx    Social History:  reports that she quit smoking about 25 years ago. Her smoking use included cigarettes. She has a 20.00 pack-year smoking history. She has never used smokeless tobacco. She reports current alcohol use of about 5.0 standard drinks of alcohol per week. She reports that she does not use drugs.  Allergies:  Allergies  Allergen Reactions  . Niacin     REACTION: rash    No medications prior to admission.    Results for orders placed or performed during the hospital encounter of 07/16/19 (from the past 48 hour(s))  SARS CORONAVIRUS 2 (TAT 6-24 HRS) Nasopharyngeal Nasopharyngeal Swab     Status: None   Collection Time: 07/16/19 11:24 AM   Specimen: Nasopharyngeal Swab  Result Value Ref Range   SARS Coronavirus 2 NEGATIVE NEGATIVE    Comment: (NOTE) SARS-CoV-2 target nucleic acids are NOT DETECTED. The SARS-CoV-2 RNA is generally detectable in upper and lower respiratory specimens during the acute phase of infection. Negative results do not  preclude SARS-CoV-2 infection, do not rule out co-infections with other pathogens, and should not be used as the sole basis for treatment or other patient management decisions. Negative results must be combined with clinical observations, patient history, and epidemiological information. The expected result is Negative. Fact Sheet for Patients: SugarRoll.be Fact Sheet for Healthcare Providers: https://www.woods-mathews.com/ This test is not yet approved or cleared by the [place] FDA and  has been authorized for detection and/or diagnosis of SARS-CoV-2 by FDA under an Emergency Use Authorization (EUA). This EUA will remain  in effect (meaning this test can be  used) for the duration of the COVID-19 declaration under Section 56 4(b)(1) of the Act, 21 U.S.C. section 360bbb-3(b)(1), unless the authorization is terminated or revoked sooner. Performed at [facility], [place] [address], [place], [place] [address]    No results found.  Review of Systems  Constitutional: Negative for chills and fever.  Genitourinary: Positive for dysuria.  All other systems reviewed and are negative.   Height 5\' 6"  (1.676 m), weight 81.6 kg. Physical Exam  Constitutional: She appears well-developed.  HENT:  Head: Normocephalic.  Eyes: Pupils are equal, round, and reactive to light.  Neck: Normal range of motion.  Cardiovascular: Normal rate.  Respiratory: Effort normal.  GI: Soft.  Genitourinary:    Genitourinary Comments: NO CVAT at present   Musculoskeletal: Normal range of motion.  Neurological: She is alert.  Skin: Skin is warm.  Psychiatric: She has a normal mood and affect.     Assessment/Plan  Proceed as planned with cysto, bilateral retrogrades and RIGHT ureteroscopy / biopsy. Risks, benefits, alternatives, expected peri-op course discussed previously and reiterated today.   Alexis Frock, MD 07/18/2019, 8:19 AM

## 2019-07-18 NOTE — Brief Op Note (Signed)
07/18/2019  12:25 PM  PATIENT:  Allison [name]  78 y.o. female  PRE-OPERATIVE DIAGNOSIS:  RIGHT URETERAL MASS  POST-OPERATIVE DIAGNOSIS:  RIGHT URETERAL MASS  PROCEDURE:  Procedure(s) with comments: CYSTOSCOPY/RETROGRADE/URETEROSCOPY/BLADDER BIOPSY/STENT PLACEMENT (Bilateral) - 1 HR  SURGEON:  Surgeon(s) and Role:    * Alexis Frock, MD - Primary  PHYSICIAN ASSISTANT:   ASSISTANTS: none   ANESTHESIA:   general  EBL:  minimal   BLOOD ADMINISTERED:none  DRAINS: none   LOCAL MEDICATIONS USED:  NONE  SPECIMEN:  Source of Specimen:  1 - ureteroscopic biopy; 2 - ureteroscopic brushing; 3 - bladder biopsy  DISPOSITION OF SPECIMEN:  PATHOLOGY  COUNTS:  YES  TOURNIQUET:  * No tourniquets in log *  DICTATION: .Other Dictation: Dictation Number  [id number]  PLAN OF CARE: Discharge to home after PACU  PATIENT DISPOSITION:  PACU - hemodynamically stable.   Delay start of Pharmacological VTE agent (>24hrs) due to surgical blood loss or risk of bleeding: yes

## 2019-07-18 NOTE — Discharge Instructions (Signed)
1 - You may have urinary urgency (bladder spasms) and bloody urine on / off with stent in place. This is normal.  2 - Call MD or go to ER for fever >102, severe pain / nausea / vomiting not relieved by medications, or acute change in medical status  Post Anesthesia Home Care Instructions  Activity: Get plenty of rest for the remainder of the day. A responsible individual must stay with you for 24 hours following the procedure.  For the next 24 hours, DO NOT: -Drive a car -Operate machinery -Drink alcoholic beverages -Take any medication unless instructed by your physician -Make any legal decisions or sign important papers.  Meals: Start with liquid foods such as gelatin or soup. Progress to regular foods as tolerated. Avoid greasy, spicy, heavy foods. If nausea and/or vomiting occur, drink only clear liquids until the nausea and/or vomiting subsides. Call your physician if vomiting continues.  Special Instructions/Symptoms: Your throat may feel dry or sore from the anesthesia or the breathing tube placed in your throat during surgery. If this causes discomfort, gargle with warm salt water. The discomfort should disappear within 24 hours.  If you had a scopolamine patch placed behind your ear for the management of post- operative nausea and/or vomiting:  1. The medication in the patch is effective for 72 hours, after which it should be removed.  Wrap patch in a tissue and discard in the trash. Wash hands thoroughly with soap and water. 2. You may remove the patch earlier than 72 hours if you experience unpleasant side effects which may include dry mouth, dizziness or visual disturbances. 3. Avoid touching the patch. Wash your hands with soap and water after contact with the patch.     

## 2019-07-18 NOTE — Anesthesia Preprocedure Evaluation (Addendum)
Anesthesia Evaluation  Patient identified by MRN, date of birth, ID band Patient awake    Reviewed: Allergy & Precautions, NPO status , Patient's Chart, lab work & pertinent test results  History of Anesthesia Complications Negative for: history of anesthetic complications  Airway Mallampati: II  TM Distance: >3 FB Neck ROM: Full    Dental  (+) Dental Advisory Given, Caps, Implants,    Pulmonary former smoker,  07/16/2019 SARS coronavirus NEG   breath sounds clear to auscultation       Cardiovascular hypertension, Pt. on medications (-) angina Rhythm:Regular Rate:Normal     Neuro/Psych negative neurological ROS     GI/Hepatic Neg liver ROS, GERD  Medicated and Controlled,  Endo/Other  Hypothyroidism   Renal/GU negative Renal ROS   Ureteral mass    Musculoskeletal  (+) Arthritis , Osteoarthritis,    Abdominal   Peds  Hematology negative hematology ROS (+)   Anesthesia Other Findings   Reproductive/Obstetrics                          Anesthesia Physical Anesthesia Plan  ASA: II  Anesthesia Plan: General   Post-op Pain Management:    Induction: Intravenous  PONV Risk Score and Plan: 3 and Ondansetron, Dexamethasone and Treatment may vary due to age or medical condition  Airway Management Planned: LMA  Additional Equipment:   Intra-op Plan:   Post-operative Plan:   Informed Consent: I have reviewed the patients History and Physical, chart, labs and discussed the procedure including the risks, benefits and alternatives for the proposed anesthesia with the patient or authorized representative who has indicated his/her understanding and acceptance.     Dental advisory given  Plan Discussed with: CRNA and Surgeon  Anesthesia Plan Comments:        Anesthesia Quick Evaluation

## 2019-07-18 NOTE — Anesthesia Postprocedure Evaluation (Signed)
Anesthesia Post Note  Patient: SHANICA CREGAN  Procedure(s) Performed: CYSTOSCOPY/RETROGRADE/RIGHT URETEROSCOPY/BLADDER BIOPSY/STENT PLACEMENT/RIGHT URETERAL MASS BIOPSY (Bilateral )     Patient location during evaluation: PACU Anesthesia Type: General Level of consciousness: awake and alert, patient cooperative and oriented Pain management: pain level controlled Vital Signs Assessment: post-procedure vital signs reviewed and stable Respiratory status: spontaneous breathing, nonlabored ventilation and respiratory function stable Cardiovascular status: blood pressure returned to baseline and stable Postop Assessment: no apparent nausea or vomiting and adequate PO intake Anesthetic complications: no    Last Vitals:  Vitals:   07/18/19 1309 07/18/19 1315  BP:    Pulse: 70 65  Resp: 12 17  Temp:  36.4 C  SpO2: 95% 96%    Last Pain:  Vitals:   07/18/19 1315  TempSrc: Oral  PainSc:                  Leniyah Martell,E. Mozell Hardacre

## 2019-07-18 NOTE — Anesthesia Procedure Notes (Signed)
Procedure Name: LMA Insertion Date/Time: 07/18/2019 11:36 AM Performed by: Wanita Chamberlain, CRNA Pre-anesthesia Checklist: Patient identified, Emergency Drugs available, Suction available and Patient being monitored Patient Re-evaluated:Patient Re-evaluated prior to induction Oxygen Delivery Method: Circle system utilized Preoxygenation: Pre-oxygenation with 100% oxygen Induction Type: IV induction Ventilation: Mask ventilation without difficulty LMA: LMA inserted LMA Size: 4.0 Number of attempts: 2 Airway Equipment and Method: Bite block

## 2019-07-19 LAB — CYTOLOGY - NON PAP

## 2019-07-19 LAB — SURGICAL PATHOLOGY

## 2019-07-19 NOTE — Op Note (Signed)
NAME: Allison [name], Allison [name] MEDICAL RECORD [id number] ACCOUNT [id number] DATE OF BIRTH:02/17/1941 FACILITY: [facility] LOCATION: [facility] PHYSICIAN:Cyndal Kasson, MD  OPERATIVE REPORT  DATE OF PROCEDURE:  07/18/2019  PREOPERATIVE DIAGNOSES:  Right distal ureteral mass with hydronephrosis, recurrent urinary tract infections.  PROCEDURE: 1.  Cystoscopy with bilateral pyelograms, interpretation. 2.  Right ureteroscopy with biopsy of mass. 3.  Bladder biopsy with fulguration. 4.  Insertion of right ureteral stent 5 x 22 Polaris, no tether.    ESTIMATED BLOOD LOSS:  Nil.  COMPLICATIONS:  None.  SPECIMENS: 1.  Right distal ureteral mass, biopsies for permanent pathology. 2.  Right distal ureteral mass, brushings for cytology, pathology. 3.  Bladder biopsy from pathology.  FINDINGS: 1.  Unremarkable urothelium of the bladder, mild thickening in the area of the right ureteral orifice. 2.  Unremarkable left retrograde pyelogram. 3.  Narrowing and filling defect in the distal 1/5 of the right ureter. 4.  Right distal papillary changes consistent with urothelial carcinoma with hydronephrosis above this. 5.  Successful placement of right ureteral stent, proximal end in renal pelvis, distal end in urinary bladder.  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  This  overall picture was concerning for possible distal ureteral tumor versus stricture.  Options were discussed including recommended path of operative evaluation with retrograde ureteroscopy and biopsy, and she wished to proceed.  Informed consent was  obtained and placed in the medical record.  PROCEDURE IN DETAIL:  The patient being identified, procedure being cysto, bilateral retrogrades, right ureteroscopy, possible biopsy were  confirmed.  Procedure time-out was performed.  Antibiotics were administered.  General LMA anesthesia was induced.   The patient was placed into a low lithotomy position.  A sterile field was created by prepping and draping the patient's vagina, introitus, and proximal thighs using iodine.  Cystourethroscopy was performed using a 21-French rigid cystoscope with offset  lens.  Inspection of bladder revealed no diverticula, calcifications, papillary lesions.  The area of the right ureteral orifice was somewhat thickened but without obvious papillary changes.  The left ureteral orifice was cannulated with a 6-French  renal catheter and left retrograde pyelogram was obtained.  Left retrograde pyelogram demonstrated a single left ureter with single-system left kidney.  No filling defects or narrowing noted.  Similarly, right pyelogram was obtained.  Right retrograde pyelogram demonstrated a single right ureter with single-system right kidney.  There was significant narrowing and ragged-edge appearance of the distal fifth of the ureter, mostly intramural but below the area of the iliacs.  This was  concerning for possible tumor in this location.  There was some mild tortuosity and hydronephrosis above this.  The ZIPwire was advanced to the lower pole and set aside as a safety wire.  An 8-French feeding tube was placed in the urinary bladder for  pressure release and semirigid ureteroscopy performed of the distal right ureter alongside a sensor working wire.  As expected, there was significant narrowing in the distal-most ureter below the iliac vessels.  There was some subtle papillary changes  and some mucosal erythema worrisome for urothelial carcinoma in this location that did appear amenable to ureteroscopic biopsy.  Initial biopsy technique was performed using a cold ureteroscopic grasper, and several representative areas of the ureteral  mucosa were biopsied and set aside for permanent pathology.  Next, a  ureteral brush biopsy apparatus was visually positioned across the area of narrowing and papillary changes and agitated for several seconds, then removed and set aside in a separate  saline container for cytology analysis.  Semirigid ureteroscopy was then performed of the remainder of the right ureter, and no additional narrowing or mucosal changes were noted.  The semirigid scope was exchanged for a flexible distal ureteroscope over  the sensor working wire, and flexible digital ureteroscopy performed of the entire right kidney, including all calices x3.  No additional erythema or papillary changes were noted.  The flexible scope was removed under continuous vision in the only area  of abnormality, but again the distal ureter did appear to be intact.  Given the narrowing and hydronephrosis, it was felt that stenting with nontethered stent would be warranted, and a new 5 x 22 Polaris-type stent was placed using cystoscopic and  fluoroscopic guidance.  Good proximal and distal planes were noted.  Given the area of thickening near the area of the right ureteral orifice of trigone, resectoscope sheath with obturator were used to once again visualize the bladder.  Using  resectoscope loop, 2 representative samples of tissue were obtained medial to the right ureteral orifice in the area of thickening and set aside as a bladder biopsy.  The base of these were fulgurated and resulted in excellent hemostasis.  No evidence of  bladder perforation.  The bladder was empty per cystoscope.  Procedure was terminated.  The patient tolerated the procedure well.  No immediate complications.  The patient was taken to postanesthesia care in stable condition with plan for discharge  home.  LN/NUANCE  D:07/18/2019 T:07/19/2019 JOB:[id number]

## 2019-07-20 ENCOUNTER — Encounter (HOSPITAL_BASED_OUTPATIENT_CLINIC_OR_DEPARTMENT_OTHER): Payer: Self-pay | Admitting: Urology

## 2019-07-24 ENCOUNTER — Telehealth: Payer: Self-pay | Admitting: Family Medicine

## 2019-07-24 NOTE — Telephone Encounter (Signed)
   Fax refill request from [facility]  Levothyroxine 75 mcg  #90

## 2019-07-24 NOTE — Telephone Encounter (Signed)
Pt advised she has enough to last until her appt. Kh

## 2019-07-26 DIAGNOSIS — N130 Hydronephrosis with ureteropelvic junction obstruction: Secondary | ICD-10-CM | POA: Diagnosis not present

## 2019-07-26 DIAGNOSIS — N390 Urinary tract infection, site not specified: Secondary | ICD-10-CM | POA: Diagnosis not present

## 2019-08-01 ENCOUNTER — Other Ambulatory Visit: Payer: Self-pay

## 2019-08-01 ENCOUNTER — Encounter: Payer: Self-pay | Admitting: Family Medicine

## 2019-08-01 ENCOUNTER — Other Ambulatory Visit: Payer: Self-pay | Admitting: Urology

## 2019-08-01 ENCOUNTER — Ambulatory Visit (INDEPENDENT_AMBULATORY_CARE_PROVIDER_SITE_OTHER): Payer: PPO | Admitting: Family Medicine

## 2019-08-01 VITALS — BP 120/76 | HR 71 | Temp 98.2°F | Wt 178.4 lb

## 2019-08-01 DIAGNOSIS — N3020 Other chronic cystitis without hematuria: Secondary | ICD-10-CM | POA: Diagnosis not present

## 2019-08-01 DIAGNOSIS — Z87442 Personal history of urinary calculi: Secondary | ICD-10-CM | POA: Diagnosis not present

## 2019-08-01 DIAGNOSIS — R0602 Shortness of breath: Secondary | ICD-10-CM

## 2019-08-01 DIAGNOSIS — E7800 Pure hypercholesterolemia, unspecified: Secondary | ICD-10-CM

## 2019-08-01 DIAGNOSIS — I10 Essential (primary) hypertension: Secondary | ICD-10-CM

## 2019-08-01 DIAGNOSIS — R5383 Other fatigue: Secondary | ICD-10-CM | POA: Diagnosis not present

## 2019-08-01 DIAGNOSIS — E039 Hypothyroidism, unspecified: Secondary | ICD-10-CM | POA: Diagnosis not present

## 2019-08-01 DIAGNOSIS — K219 Gastro-esophageal reflux disease without esophagitis: Secondary | ICD-10-CM

## 2019-08-01 MED ORDER — LEVOTHYROXINE SODIUM 75 MCG PO TABS: 75.0000 ug | ORAL_TABLET | Freq: Every day | ORAL | 3 refills | Status: DC

## 2019-08-01 MED ORDER — LOSARTAN POTASSIUM-HCTZ 100-25 MG PO TABS: 1.0000 | ORAL_TABLET | Freq: Every day | ORAL | 0 refills | Status: DC

## 2019-08-01 MED ORDER — SIMVASTATIN 40 MG PO TABS: 40.0000 mg | ORAL_TABLET | Freq: Every day | ORAL | 3 refills | Status: DC

## 2019-08-01 NOTE — Progress Notes (Signed)
   Subjective:    Patient ID: Allison [name], female    DOB: December 23, 1940, 78 y.o.   MRN: [id number]  HPI She is here for medication management visit.  She has been recently evaluated by urology for urinary tract symptoms.  No cancer was diagnosed but I do not have the final report.  She continues on her thyroid medication without difficulty. She continues on simvastatin as well as Prilosec and having no difficulty with reflux or muscle aches and pains.  She is also taking losartan/HCTZ.  She states that over the last 2 weeks she has had difficulty with shortness of breath and fatigue but no fever, chills, chest pain, coughing, nausea or vomiting.  Review of Systems     Objective:   Physical Exam Alert and in no distress. Tympanic membranes and canals are normal. Pharyngeal area is normal. Neck is supple without adenopathy or thyromegaly. Cardiac exam shows a regular sinus rhythm without murmurs or gallops. Lungs are clear to auscultation. Recent blood work including hemoglobin and C met was evaluated and is essentially normal EKG shows no acute changes      Assessment & Plan:  Essential hypertension - Plan: EKG 12-Lead, losartan-hydrochlorothiazide (HYZAAR) 100-25 MG tablet  Gastroesophageal reflux disease without esophagitis  Hypothyroidism, unspecified type - Plan: TSH, levothyroxine (SYNTHROID) 75 MCG tablet  Shortness of breath  Fatigue, unspecified type  Chronic nonspecific cystitis  HYPERCHOLESTEROLEMIA - Plan: Lipid panel, simvastatin (ZOCOR) 40 MG tablet  History of kidney stones  The etiology of the shortness of breath and fatigue is unclear.  I will follow-up after blood work.

## 2019-08-02 LAB — LIPID PANEL
Chol/HDL Ratio: 3.8 ratio (ref 0.0–4.4)
Cholesterol, Total: 162 mg/dL (ref 100–199)
HDL: 43 mg/dL (ref >39)
LDL Chol Calc (NIH): 82 mg/dL (ref 0–99)
Triglycerides: 219 mg/dL — ABNORMAL HIGH (ref 0–149)
VLDL Cholesterol Cal: 37 mg/dL (ref 5–40)

## 2019-08-02 LAB — TSH: TSH: 1.33 u[IU]/mL (ref 0.450–4.500)

## 2019-08-13 ENCOUNTER — Telehealth: Payer: Self-pay | Admitting: Family Medicine

## 2019-08-13 NOTE — Telephone Encounter (Signed)
Pt was advise of [facility] number and to help will resend over request form [facility]

## 2019-08-13 NOTE — Telephone Encounter (Signed)
Pt called and left message that she cannot get [facility] to answer.  Can you recommend someone else for mammo and bone density?

## 2019-08-24 DIAGNOSIS — Z780 Asymptomatic menopausal state: Secondary | ICD-10-CM | POA: Diagnosis not present

## 2019-08-24 DIAGNOSIS — Z1231 Encounter for screening mammogram for malignant neoplasm of breast: Secondary | ICD-10-CM | POA: Diagnosis not present

## 2019-08-24 DIAGNOSIS — Z803 Family history of malignant neoplasm of breast: Secondary | ICD-10-CM | POA: Diagnosis not present

## 2019-08-24 DIAGNOSIS — Z90710 Acquired absence of both cervix and uterus: Secondary | ICD-10-CM | POA: Diagnosis not present

## 2019-08-24 DIAGNOSIS — M85851 Other specified disorders of bone density and structure, right thigh: Secondary | ICD-10-CM | POA: Diagnosis not present

## 2019-08-24 DIAGNOSIS — R29890 Loss of height: Secondary | ICD-10-CM | POA: Diagnosis not present

## 2019-08-24 LAB — HM DEXA SCAN

## 2019-08-24 LAB — HM MAMMOGRAPHY

## 2019-08-27 ENCOUNTER — Other Ambulatory Visit: Payer: Self-pay | Admitting: Family Medicine

## 2019-08-27 DIAGNOSIS — E039 Hypothyroidism, unspecified: Secondary | ICD-10-CM

## 2019-08-27 MED ORDER — THYROID 60 MG PO TABS: 60.0000 mg | ORAL_TABLET | Freq: Every day | ORAL | 0 refills | Status: DC

## 2019-08-27 NOTE — Progress Notes (Signed)
She sent me an article concerning [facility] Thyroid has replacement for her present medication for her thyroid condition since she still feels tired.  I discussed the fact that it is not recommended but she would like to try it.  I will call in 60 mcg dosing and recheck this in 2 months.  Discussed the need for her to take this on an empty stomach which she states she is doing.

## 2019-08-28 DIAGNOSIS — M8580 Other specified disorders of bone density and structure, unspecified site: Secondary | ICD-10-CM

## 2019-08-28 NOTE — Progress Notes (Signed)
Called pt advise of new dx of low bone mass. Pt is to take calcium with vitamin d. [place]

## 2019-09-03 ENCOUNTER — Telehealth: Payer: Self-pay

## 2019-09-03 NOTE — Telephone Encounter (Signed)
Pt was given most recent lab results. [place]

## 2019-09-03 NOTE — Patient Instructions (Addendum)
DUE TO COVID-19 ONLY ONE VISITOR IS ALLOWED TO COME WITH YOU AND STAY IN THE WAITING ROOM ONLY DURING PRE OP AND PROCEDURE DAY OF SURGERY. THE 1 VISITOR MAY VISIT WITH YOU AFTER SURGERY IN YOUR PRIVATE ROOM DURING VISITING HOURS ONLY!  YOU NEED TO HAVE A COVID 19 TEST ON 09-14-19  @ 11:00 AM, THIS TEST MUST BE DONE BEFORE SURGERY, COME  [place], [place] , [address].  ([place]) ONCE YOUR COVID TEST IS COMPLETED, PLEASE BEGIN THE QUARANTINE INSTRUCTIONS AS OUTLINED IN YOUR HANDOUT.                Allison [name]  09/03/2019   Your procedure is scheduled on: 09-07-19    Report to [facility] Main  Entrance    Report to Short Stay at 5:30 AM     Call this number if you have problems the morning of surgery [phone]      Remember: PLEASE CONSUME A CLEAR DIET ON THE DAY OF PREP, PER YOUR SURGEON'S INSTRUCTIONS.   Do not eat food or drink liquids :After Midnight.     CLEAR LIQUID DIET   Foods Allowed                                                                     Foods Excluded  Coffee and tea, regular and decaf                             liquids that you cannot  Plain Jell-O any favor except red or purple                                           see through such as: Fruit ices (not with fruit pulp)                                     milk, soups, orange juice  Iced Popsicles                                    All solid food Carbonated beverages, regular and diet                                    Cranberry, grape and apple juices Sports drinks like Gatorade Lightly seasoned clear broth or consume(fat free) Sugar, honey syrup  Sample Menu Breakfast                                Lunch                                     Supper Cranberry juice  Beef broth                            Chicken broth Jell-O                                     Grape juice                           Apple juice Coffee or tea                         Jell-O                                      Popsicle                                                Coffee or tea                        Coffee or tea  _____________________________________________________________________       Take these medicines the morning of surgery with A SIP OF WATER: Synthroid, Prilosec, and Zocor  BRUSH YOUR TEETH MORNING OF SURGERY AND RINSE YOUR MOUTH OUT, NO CHEWING GUM CANDY OR MINTS.                                 You may not have any metal on your body including hair pins and              piercings     Do not wear jewelry, make-up, lotions, powders or perfumes, deodorant              Do not wear nail polish on your fingernails.  Do not shave  48 hours prior to surgery.              Do not bring valuables to the hospital. [place].  Contacts, dentures or bridgework may not be worn into surgery.  Leave suitcase in the car. After surgery it may be brought to your room.       Special Instructions: N/A              Please read over the following fact sheets you were given: _____________________________________________________________________             [facility] - Preparing for Surgery Before surgery, you can play an important role.  Because skin is not sterile, your skin needs to be as free of germs as possible.  You can reduce the number of germs on your skin by washing with CHG (chlorahexidine gluconate) soap before surgery.  CHG is an antiseptic cleaner which kills germs and bonds with the skin to continue killing germs even after washing. Please DO NOT use if you have an allergy to CHG or antibacterial soaps.  If your skin becomes reddened/irritated stop using the CHG and inform your nurse when you  arrive at Short Stay. Do not shave (including legs and underarms) for at least 48 hours prior to the first CHG shower.  You may shave your face/neck. Please follow these instructions  carefully:  1.  Shower with CHG Soap the night before surgery and the  morning of Surgery.  2.  If you choose to wash your hair, wash your hair first as usual with your  normal  shampoo.  3.  After you shampoo, rinse your hair and body thoroughly to remove the  shampoo.                           4.  Use CHG as you would any other liquid soap.  You can apply chg directly  to the skin and wash                       Gently with a scrungie or clean washcloth.  5.  Apply the CHG Soap to your body ONLY FROM THE NECK DOWN.   Do not use on face/ open                           Wound or open sores. Avoid contact with eyes, ears mouth and genitals (private parts).                       Wash face,  Genitals (private parts) with your normal soap.             6.  Wash thoroughly, paying special attention to the area where your surgery  will be performed.  7.  Thoroughly rinse your body with warm water from the neck down.  8.  DO NOT shower/wash with your normal soap after using and rinsing off  the CHG Soap.                9.  Pat yourself dry with a clean towel.            10.  Wear clean pajamas.            11.  Place clean sheets on your bed the night of your first shower and do not  sleep with pets. Day of Surgery : Do not apply any lotions/deodorants the morning of surgery.  Please wear clean clothes to the hospital/surgery center.  FAILURE TO FOLLOW THESE INSTRUCTIONS MAY RESULT IN THE CANCELLATION OF YOUR SURGERY PATIENT SIGNATURE_________________________________  NURSE SIGNATURE__________________________________  ________________________________________________________________________

## 2019-09-03 NOTE — Progress Notes (Signed)
PCP - Jill Alexanders Cardiologist -   Chest x-ray -   EKG - 08-02-19  Stress Test -  ECHO -  Cardiac Cath -   Sleep Study -  CPAP -   Fasting Blood Sugar -  Checks Blood Sugar _____ times a day  Blood Thinner Instructions:  Aspirin Instructions: 81 mg, Last dose 11/7  Last Dose:  Anesthesia review:   Patient denies shortness of breath, fever, cough and chest pain at PAT appointment   Patient verbalized understanding of instructions that were given to them at the PAT appointment. Patient was also instructed that they will need to review over the PAT instructions again at home before surgery.

## 2019-09-04 ENCOUNTER — Other Ambulatory Visit (HOSPITAL_COMMUNITY)
Admission: RE | Admit: 2019-09-04 | Discharge: 2019-09-04 | Disposition: A | Discharge status: Home / Self Care | Payer: PPO | Source: Ambulatory Visit | Attending: Urology | Admitting: Urology

## 2019-09-04 ENCOUNTER — Other Ambulatory Visit: Payer: Self-pay

## 2019-09-04 ENCOUNTER — Encounter (HOSPITAL_COMMUNITY): Payer: Self-pay

## 2019-09-04 ENCOUNTER — Encounter (HOSPITAL_COMMUNITY)
Admission: RE | Admit: 2019-09-04 | Discharge: 2019-09-04 | Disposition: A | Discharge status: Home / Self Care | Payer: PPO | Source: Ambulatory Visit | Attending: Urology | Admitting: Urology

## 2019-09-04 DIAGNOSIS — Z800 Family history of malignant neoplasm of digestive organs: Secondary | ICD-10-CM | POA: Diagnosis not present

## 2019-09-04 DIAGNOSIS — Z8249 Family history of ischemic heart disease and other diseases of the circulatory system: Secondary | ICD-10-CM | POA: Diagnosis not present

## 2019-09-04 DIAGNOSIS — Z79890 Hormone replacement therapy (postmenopausal): Secondary | ICD-10-CM | POA: Diagnosis not present

## 2019-09-04 DIAGNOSIS — Z20828 Contact with and (suspected) exposure to other viral communicable diseases: Secondary | ICD-10-CM | POA: Diagnosis present

## 2019-09-04 DIAGNOSIS — Z87442 Personal history of urinary calculi: Secondary | ICD-10-CM | POA: Diagnosis not present

## 2019-09-04 DIAGNOSIS — I10 Essential (primary) hypertension: Secondary | ICD-10-CM | POA: Diagnosis present

## 2019-09-04 DIAGNOSIS — Z01812 Encounter for preprocedural laboratory examination: Secondary | ICD-10-CM | POA: Insufficient documentation

## 2019-09-04 DIAGNOSIS — Z6829 Body mass index (BMI) 29.0-29.9, adult: Secondary | ICD-10-CM | POA: Diagnosis not present

## 2019-09-04 DIAGNOSIS — Z888 Allergy status to other drugs, medicaments and biological substances status: Secondary | ICD-10-CM | POA: Diagnosis not present

## 2019-09-04 DIAGNOSIS — Z87891 Personal history of nicotine dependence: Secondary | ICD-10-CM | POA: Diagnosis not present

## 2019-09-04 DIAGNOSIS — E669 Obesity, unspecified: Secondary | ICD-10-CM | POA: Diagnosis present

## 2019-09-04 DIAGNOSIS — N2889 Other specified disorders of kidney and ureter: Secondary | ICD-10-CM | POA: Diagnosis not present

## 2019-09-04 DIAGNOSIS — N135 Crossing vessel and stricture of ureter without hydronephrosis: Secondary | ICD-10-CM | POA: Diagnosis present

## 2019-09-04 DIAGNOSIS — N131 Hydronephrosis with ureteral stricture, not elsewhere classified: Secondary | ICD-10-CM | POA: Insufficient documentation

## 2019-09-04 DIAGNOSIS — E039 Hypothyroidism, unspecified: Secondary | ICD-10-CM | POA: Diagnosis present

## 2019-09-04 DIAGNOSIS — N3020 Other chronic cystitis without hematuria: Secondary | ICD-10-CM | POA: Diagnosis not present

## 2019-09-04 DIAGNOSIS — N3090 Cystitis, unspecified without hematuria: Secondary | ICD-10-CM | POA: Diagnosis present

## 2019-09-04 DIAGNOSIS — Z960 Presence of urogenital implants: Secondary | ICD-10-CM | POA: Diagnosis present

## 2019-09-04 DIAGNOSIS — N130 Hydronephrosis with ureteropelvic junction obstruction: Secondary | ICD-10-CM | POA: Diagnosis not present

## 2019-09-04 DIAGNOSIS — Z23 Encounter for immunization: Secondary | ICD-10-CM | POA: Diagnosis present

## 2019-09-04 DIAGNOSIS — Z8371 Family history of colonic polyps: Secondary | ICD-10-CM | POA: Diagnosis not present

## 2019-09-04 DIAGNOSIS — D509 Iron deficiency anemia, unspecified: Secondary | ICD-10-CM | POA: Diagnosis present

## 2019-09-04 DIAGNOSIS — E7800 Pure hypercholesterolemia, unspecified: Secondary | ICD-10-CM | POA: Diagnosis present

## 2019-09-04 DIAGNOSIS — K219 Gastro-esophageal reflux disease without esophagitis: Secondary | ICD-10-CM | POA: Diagnosis present

## 2019-09-04 DIAGNOSIS — Z833 Family history of diabetes mellitus: Secondary | ICD-10-CM | POA: Diagnosis not present

## 2019-09-04 DIAGNOSIS — Z8349 Family history of other endocrine, nutritional and metabolic diseases: Secondary | ICD-10-CM | POA: Diagnosis not present

## 2019-09-04 DIAGNOSIS — H3530 Unspecified macular degeneration: Secondary | ICD-10-CM | POA: Diagnosis present

## 2019-09-04 DIAGNOSIS — Z7982 Long term (current) use of aspirin: Secondary | ICD-10-CM | POA: Diagnosis not present

## 2019-09-04 DIAGNOSIS — Z8261 Family history of arthritis: Secondary | ICD-10-CM | POA: Diagnosis not present

## 2019-09-04 LAB — BASIC METABOLIC PANEL
Anion gap: 10 (ref 5–15)
BUN: 26 mg/dL — ABNORMAL HIGH (ref 8–23)
CO2: 25 mmol/L (ref 22–32)
Calcium: 10 mg/dL (ref 8.9–10.3)
Chloride: 106 mmol/L (ref 98–111)
Creatinine, Ser: 1.29 mg/dL — ABNORMAL HIGH (ref 0.44–1.00)
GFR calc Af Amer: 46 mL/min — ABNORMAL LOW (ref >60)
GFR calc non Af Amer: 39 mL/min — ABNORMAL LOW (ref >60)
Glucose, Bld: 124 mg/dL — ABNORMAL HIGH (ref 70–99)
Potassium: 4.8 mmol/L (ref 3.5–5.1)
Sodium: 141 mmol/L (ref 135–145)

## 2019-09-04 LAB — CBC
HCT: 39.3 % (ref 36.0–46.0)
Hemoglobin: 12.1 g/dL (ref 12.0–15.0)
MCH: 31.1 pg (ref 26.0–34.0)
MCHC: 30.8 g/dL (ref 30.0–36.0)
MCV: 101 fL — ABNORMAL HIGH (ref 80.0–100.0)
Platelets: 192 10*3/uL (ref 150–400)
RBC: 3.89 MIL/uL (ref 3.87–5.11)
RDW: 14 % (ref 11.5–15.5)
WBC: 6.7 10*3/uL (ref 4.0–10.5)
nRBC: 0 % (ref 0.0–0.2)

## 2019-09-04 MED ORDER — MAGNESIUM CITRATE PO SOLN
1.0000 | Freq: Once | ORAL | Status: DC
  Filled 2019-09-04: qty 296

## 2019-09-06 LAB — NOVEL CORONAVIRUS, NAA (HOSP ORDER, SEND-OUT TO REF LAB; TAT 18-24 HRS): SARS-CoV-2, NAA: NOT DETECTED

## 2019-09-06 NOTE — Anesthesia Preprocedure Evaluation (Addendum)
Anesthesia Evaluation  Patient identified by MRN, date of birth, ID band Patient awake    Reviewed: Allergy & Precautions, H&P , NPO status , Patient's Chart, lab work & pertinent test results  Airway Mallampati: II  TM Distance: >3 FB Neck ROM: Full    Dental no notable dental hx. (+) Teeth Intact, Dental Advisory Given   Pulmonary neg pulmonary ROS, former smoker,    Pulmonary exam normal breath sounds clear to auscultation       Cardiovascular Exercise Tolerance: Good hypertension, Pt. on medications  Rhythm:Regular Rate:Normal     Neuro/Psych negative neurological ROS  negative psych ROS   GI/Hepatic Neg liver ROS, GERD  Medicated and Controlled,  Endo/Other  Hypothyroidism   Renal/GU negative Renal ROS  negative genitourinary   Musculoskeletal  (+) Arthritis , Osteoarthritis,    Abdominal   Peds  Hematology  (+) Blood dyscrasia, anemia ,   Anesthesia Other Findings   Reproductive/Obstetrics negative OB ROS                            Anesthesia Physical Anesthesia Plan  ASA: II  Anesthesia Plan: General   Post-op Pain Management:    Induction: Intravenous  PONV Risk Score and Plan: 4 or greater and Ondansetron, Dexamethasone and Treatment may vary due to age or medical condition  Airway Management Planned: Oral ETT  Additional Equipment:   Intra-op Plan:   Post-operative Plan: Extubation in OR  Informed Consent: I have reviewed the patients History and Physical, chart, labs and discussed the procedure including the risks, benefits and alternatives for the proposed anesthesia with the patient or authorized representative who has indicated his/her understanding and acceptance.     Dental advisory given  Plan Discussed with: CRNA  Anesthesia Plan Comments:         Anesthesia Quick Evaluation

## 2019-09-07 ENCOUNTER — Encounter (HOSPITAL_COMMUNITY): Payer: Self-pay | Admitting: *Deleted

## 2019-09-07 ENCOUNTER — Encounter (HOSPITAL_COMMUNITY)
Admission: RE | Disposition: A | Discharge status: Home / Self Care | Payer: Self-pay | Source: Other Acute Inpatient Hospital | Attending: Urology

## 2019-09-07 ENCOUNTER — Inpatient Hospital Stay (HOSPITAL_COMMUNITY): Payer: PPO | Admitting: Physician Assistant

## 2019-09-07 ENCOUNTER — Inpatient Hospital Stay (HOSPITAL_COMMUNITY): Payer: PPO

## 2019-09-07 ENCOUNTER — Inpatient Hospital Stay (HOSPITAL_COMMUNITY)
Admission: RE | Admit: 2019-09-07 | Discharge: 2019-09-09 | DRG: 661 | Disposition: A | Discharge status: Home / Self Care | Payer: PPO | Source: Other Acute Inpatient Hospital | Attending: Urology | Admitting: Urology

## 2019-09-07 ENCOUNTER — Inpatient Hospital Stay (HOSPITAL_COMMUNITY): Payer: PPO | Admitting: Anesthesiology

## 2019-09-07 ENCOUNTER — Other Ambulatory Visit: Payer: Self-pay

## 2019-09-07 DIAGNOSIS — N3090 Cystitis, unspecified without hematuria: Secondary | ICD-10-CM | POA: Diagnosis present

## 2019-09-07 DIAGNOSIS — E039 Hypothyroidism, unspecified: Secondary | ICD-10-CM | POA: Diagnosis present

## 2019-09-07 DIAGNOSIS — Z6829 Body mass index (BMI) 29.0-29.9, adult: Secondary | ICD-10-CM | POA: Diagnosis not present

## 2019-09-07 DIAGNOSIS — Z87891 Personal history of nicotine dependence: Secondary | ICD-10-CM | POA: Diagnosis not present

## 2019-09-07 DIAGNOSIS — Z87442 Personal history of urinary calculi: Secondary | ICD-10-CM

## 2019-09-07 DIAGNOSIS — K219 Gastro-esophageal reflux disease without esophagitis: Secondary | ICD-10-CM | POA: Diagnosis present

## 2019-09-07 DIAGNOSIS — D509 Iron deficiency anemia, unspecified: Secondary | ICD-10-CM | POA: Diagnosis present

## 2019-09-07 DIAGNOSIS — Z960 Presence of urogenital implants: Secondary | ICD-10-CM | POA: Diagnosis present

## 2019-09-07 DIAGNOSIS — Z8249 Family history of ischemic heart disease and other diseases of the circulatory system: Secondary | ICD-10-CM

## 2019-09-07 DIAGNOSIS — Z833 Family history of diabetes mellitus: Secondary | ICD-10-CM

## 2019-09-07 DIAGNOSIS — E669 Obesity, unspecified: Secondary | ICD-10-CM | POA: Diagnosis present

## 2019-09-07 DIAGNOSIS — Z8349 Family history of other endocrine, nutritional and metabolic diseases: Secondary | ICD-10-CM | POA: Diagnosis not present

## 2019-09-07 DIAGNOSIS — I10 Essential (primary) hypertension: Secondary | ICD-10-CM | POA: Diagnosis present

## 2019-09-07 DIAGNOSIS — Z20828 Contact with and (suspected) exposure to other viral communicable diseases: Secondary | ICD-10-CM | POA: Diagnosis present

## 2019-09-07 DIAGNOSIS — M1990 Unspecified osteoarthritis, unspecified site: Secondary | ICD-10-CM | POA: Diagnosis present

## 2019-09-07 DIAGNOSIS — H3530 Unspecified macular degeneration: Secondary | ICD-10-CM | POA: Diagnosis present

## 2019-09-07 DIAGNOSIS — Z800 Family history of malignant neoplasm of digestive organs: Secondary | ICD-10-CM | POA: Diagnosis not present

## 2019-09-07 DIAGNOSIS — Z79890 Hormone replacement therapy (postmenopausal): Secondary | ICD-10-CM

## 2019-09-07 DIAGNOSIS — Z79899 Other long term (current) drug therapy: Secondary | ICD-10-CM

## 2019-09-07 DIAGNOSIS — Z23 Encounter for immunization: Secondary | ICD-10-CM

## 2019-09-07 DIAGNOSIS — Z8371 Family history of colonic polyps: Secondary | ICD-10-CM | POA: Diagnosis not present

## 2019-09-07 DIAGNOSIS — Z7982 Long term (current) use of aspirin: Secondary | ICD-10-CM | POA: Diagnosis not present

## 2019-09-07 DIAGNOSIS — Z8261 Family history of arthritis: Secondary | ICD-10-CM

## 2019-09-07 DIAGNOSIS — E7800 Pure hypercholesterolemia, unspecified: Secondary | ICD-10-CM | POA: Diagnosis present

## 2019-09-07 DIAGNOSIS — N135 Crossing vessel and stricture of ureter without hydronephrosis: Principal | ICD-10-CM | POA: Diagnosis present

## 2019-09-07 DIAGNOSIS — Z888 Allergy status to other drugs, medicaments and biological substances status: Secondary | ICD-10-CM

## 2019-09-07 HISTORY — PX: CYSTOSCOPY W/ URETERAL STENT PLACEMENT: SHX1429

## 2019-09-07 LAB — HEMOGLOBIN AND HEMATOCRIT, BLOOD
HCT: 36.1 % (ref 36.0–46.0)
Hemoglobin: 11.3 g/dL — ABNORMAL LOW (ref 12.0–15.0)

## 2019-09-07 SURGERY — REIMPLANTATION, URETER, ROBOT-ASSISTED, LAPAROSCOPIC
Anesthesia: General | Laterality: Right

## 2019-09-07 MED ORDER — STERILE WATER FOR IRRIGATION IR SOLN
Status: DC | PRN
  Administered 2019-09-07: 3000 mL

## 2019-09-07 MED ORDER — LABETALOL HCL 5 MG/ML IV SOLN
INTRAVENOUS | Status: DC | PRN
  Administered 2019-09-07: 5 mg via INTRAVENOUS

## 2019-09-07 MED ORDER — DIPHENHYDRAMINE HCL 12.5 MG/5ML PO ELIX: 12.5000 mg | ORAL_SOLUTION | Freq: Four times a day (QID) | ORAL | Status: DC | PRN

## 2019-09-07 MED ORDER — LOSARTAN POTASSIUM 50 MG PO TABS
100.0000 mg | ORAL_TABLET | Freq: Every day | ORAL | Status: DC
  Administered 2019-09-08 – 2019-09-09 (×2): 100 mg via ORAL
  Filled 2019-09-07 (×3): qty 2

## 2019-09-07 MED ORDER — HYDROMORPHONE HCL 1 MG/ML IJ SOLN
0.2500 mg | INTRAMUSCULAR | Status: DC | PRN
  Administered 2019-09-07 (×3): 0.5 mg via INTRAVENOUS

## 2019-09-07 MED ORDER — PROPOFOL 10 MG/ML IV BOLUS
INTRAVENOUS | Status: AC
  Filled 2019-09-07: qty 20

## 2019-09-07 MED ORDER — FENTANYL CITRATE (PF) 100 MCG/2ML IJ SOLN
INTRAMUSCULAR | Status: AC
  Filled 2019-09-07: qty 2

## 2019-09-07 MED ORDER — LACTATED RINGERS IV SOLN
INTRAVENOUS | Status: DC
  Administered 2019-09-07 (×2): via INTRAVENOUS

## 2019-09-07 MED ORDER — SUCCINYLCHOLINE CHLORIDE 200 MG/10ML IV SOSY
PREFILLED_SYRINGE | INTRAVENOUS | Status: AC
  Filled 2019-09-07: qty 10

## 2019-09-07 MED ORDER — PIPERACILLIN-TAZOBACTAM 3.375 G IVPB 30 MIN
3.3750 g | Freq: Once | INTRAVENOUS | Status: AC
  Administered 2019-09-07: 3.375 g via INTRAVENOUS
  Filled 2019-09-07 (×2): qty 50

## 2019-09-07 MED ORDER — BUPIVACAINE LIPOSOME 1.3 % IJ SUSP
20.0000 mL | Freq: Once | INTRAMUSCULAR | Status: AC
  Administered 2019-09-07: 20 mL
  Filled 2019-09-07: qty 20

## 2019-09-07 MED ORDER — PHENYLEPHRINE HCL (PRESSORS) 10 MG/ML IV SOLN
INTRAVENOUS | Status: AC
  Filled 2019-09-07: qty 1

## 2019-09-07 MED ORDER — HYDROCHLOROTHIAZIDE 25 MG PO TABS
25.0000 mg | ORAL_TABLET | Freq: Every day | ORAL | Status: DC
  Administered 2019-09-08 – 2019-09-09 (×2): 25 mg via ORAL
  Filled 2019-09-07 (×3): qty 1

## 2019-09-07 MED ORDER — ONDANSETRON HCL 4 MG/2ML IJ SOLN: 4.0000 mg | INTRAMUSCULAR | Status: DC | PRN

## 2019-09-07 MED ORDER — LEVOTHYROXINE SODIUM 75 MCG PO TABS
75.0000 ug | ORAL_TABLET | Freq: Every day | ORAL | Status: DC
  Administered 2019-09-08 – 2019-09-09 (×2): 75 ug via ORAL
  Filled 2019-09-07 (×2): qty 1

## 2019-09-07 MED ORDER — LACTATED RINGERS IR SOLN
Status: DC | PRN
  Administered 2019-09-07: 1000 mL

## 2019-09-07 MED ORDER — LIDOCAINE 2% (20 MG/ML) 5 ML SYRINGE
INTRAMUSCULAR | Status: AC
  Filled 2019-09-07: qty 5

## 2019-09-07 MED ORDER — SUGAMMADEX SODIUM 200 MG/2ML IV SOLN
INTRAVENOUS | Status: DC | PRN
  Administered 2019-09-07: 350 mg via INTRAVENOUS

## 2019-09-07 MED ORDER — LIDOCAINE 2% (20 MG/ML) 5 ML SYRINGE
INTRAMUSCULAR | Status: DC | PRN
  Administered 2019-09-07: 80 mg via INTRAVENOUS

## 2019-09-07 MED ORDER — SODIUM CHLORIDE (PF) 0.9 % IJ SOLN
INTRAMUSCULAR | Status: AC
  Filled 2019-09-07: qty 20

## 2019-09-07 MED ORDER — CEFAZOLIN SODIUM-DEXTROSE 2-4 GM/100ML-% IV SOLN
2.0000 g | INTRAVENOUS | Status: AC
  Administered 2019-09-07: 2 g via INTRAVENOUS
  Filled 2019-09-07: qty 100

## 2019-09-07 MED ORDER — IOHEXOL 300 MG/ML  SOLN
INTRAMUSCULAR | Status: DC | PRN
  Administered 2019-09-07: 17 mL

## 2019-09-07 MED ORDER — DEXAMETHASONE SODIUM PHOSPHATE 10 MG/ML IJ SOLN
INTRAMUSCULAR | Status: DC | PRN
  Administered 2019-09-07: 10 mg via INTRAVENOUS

## 2019-09-07 MED ORDER — FENTANYL CITRATE (PF) 250 MCG/5ML IJ SOLN
INTRAMUSCULAR | Status: AC
  Filled 2019-09-07: qty 5

## 2019-09-07 MED ORDER — ACETAMINOPHEN 500 MG PO TABS
1000.0000 mg | ORAL_TABLET | Freq: Once | ORAL | Status: AC
  Administered 2019-09-07: 1000 mg via ORAL
  Filled 2019-09-07: qty 2

## 2019-09-07 MED ORDER — ROCURONIUM BROMIDE 10 MG/ML (PF) SYRINGE
PREFILLED_SYRINGE | INTRAVENOUS | Status: AC
  Filled 2019-09-07: qty 10

## 2019-09-07 MED ORDER — FENTANYL CITRATE (PF) 250 MCG/5ML IJ SOLN
INTRAMUSCULAR | Status: DC | PRN
  Administered 2019-09-07 (×2): 100 ug via INTRAVENOUS
  Administered 2019-09-07 (×5): 50 ug via INTRAVENOUS

## 2019-09-07 MED ORDER — ROCURONIUM BROMIDE 10 MG/ML (PF) SYRINGE
PREFILLED_SYRINGE | INTRAVENOUS | Status: DC | PRN
  Administered 2019-09-07 (×2): 20 mg via INTRAVENOUS
  Administered 2019-09-07: 40 mg via INTRAVENOUS
  Administered 2019-09-07: 10 mg via INTRAVENOUS

## 2019-09-07 MED ORDER — ONDANSETRON HCL 4 MG/2ML IJ SOLN
INTRAMUSCULAR | Status: DC | PRN
  Administered 2019-09-07: 4 mg via INTRAVENOUS

## 2019-09-07 MED ORDER — ONDANSETRON HCL 4 MG/2ML IJ SOLN
INTRAMUSCULAR | Status: AC
  Filled 2019-09-07: qty 2

## 2019-09-07 MED ORDER — TRAMADOL HCL 50 MG PO TABS: 50.0000 mg | ORAL_TABLET | Freq: Four times a day (QID) | ORAL | 0 refills | Status: DC | PRN

## 2019-09-07 MED ORDER — DIPHENHYDRAMINE HCL 50 MG/ML IJ SOLN: 12.5000 mg | Freq: Four times a day (QID) | INTRAMUSCULAR | Status: DC | PRN

## 2019-09-07 MED ORDER — HYDROMORPHONE HCL 1 MG/ML IJ SOLN
0.5000 mg | INTRAMUSCULAR | Status: DC | PRN
  Administered 2019-09-08 (×2): 1 mg via INTRAVENOUS
  Filled 2019-09-07 (×2): qty 1

## 2019-09-07 MED ORDER — LABETALOL HCL 5 MG/ML IV SOLN
INTRAVENOUS | Status: AC
  Filled 2019-09-07: qty 4

## 2019-09-07 MED ORDER — CHLORHEXIDINE GLUCONATE CLOTH 2 % EX PADS
6.0000 | MEDICATED_PAD | Freq: Every day | CUTANEOUS | Status: DC
  Administered 2019-09-09: 6 via TOPICAL

## 2019-09-07 MED ORDER — HYDROMORPHONE HCL 1 MG/ML IJ SOLN
INTRAMUSCULAR | Status: AC
  Filled 2019-09-07: qty 1

## 2019-09-07 MED ORDER — ACETAMINOPHEN 500 MG PO TABS
1000.0000 mg | ORAL_TABLET | Freq: Four times a day (QID) | ORAL | Status: AC
  Administered 2019-09-07 – 2019-09-08 (×3): 1000 mg via ORAL
  Filled 2019-09-07 (×4): qty 2

## 2019-09-07 MED ORDER — SIMVASTATIN 40 MG PO TABS
40.0000 mg | ORAL_TABLET | Freq: Every day | ORAL | Status: DC
  Administered 2019-09-07 – 2019-09-09 (×3): 40 mg via ORAL
  Filled 2019-09-07 (×3): qty 1

## 2019-09-07 MED ORDER — PNEUMOCOCCAL VAC POLYVALENT 25 MCG/0.5ML IJ INJ
0.5000 mL | INJECTION | INTRAMUSCULAR | Status: AC
  Administered 2019-09-08: 0.5 mL via INTRAMUSCULAR
  Filled 2019-09-07: qty 0.5

## 2019-09-07 MED ORDER — OXYCODONE HCL 5 MG PO TABS
5.0000 mg | ORAL_TABLET | ORAL | Status: DC | PRN
  Administered 2019-09-07 – 2019-09-09 (×5): 5 mg via ORAL
  Filled 2019-09-07 (×5): qty 1

## 2019-09-07 MED ORDER — DOCUSATE SODIUM 100 MG PO CAPS
100.0000 mg | ORAL_CAPSULE | Freq: Two times a day (BID) | ORAL | Status: DC
  Administered 2019-09-08 – 2019-09-09 (×2): 100 mg via ORAL
  Filled 2019-09-07 (×4): qty 1

## 2019-09-07 MED ORDER — PANTOPRAZOLE SODIUM 40 MG PO TBEC
40.0000 mg | DELAYED_RELEASE_TABLET | Freq: Every day | ORAL | Status: DC
  Administered 2019-09-07 – 2019-09-09 (×3): 40 mg via ORAL
  Filled 2019-09-07 (×3): qty 1

## 2019-09-07 MED ORDER — BELLADONNA ALKALOIDS-OPIUM 16.2-60 MG RE SUPP: 1.0000 | Freq: Four times a day (QID) | RECTAL | Status: DC | PRN

## 2019-09-07 MED ORDER — SUCCINYLCHOLINE CHLORIDE 200 MG/10ML IV SOSY
PREFILLED_SYRINGE | INTRAVENOUS | Status: DC | PRN
  Administered 2019-09-07: 100 mg via INTRAVENOUS

## 2019-09-07 MED ORDER — LOSARTAN POTASSIUM-HCTZ 100-25 MG PO TABS: 1.0000 | ORAL_TABLET | Freq: Every day | ORAL | Status: DC

## 2019-09-07 MED ORDER — DEXTROSE-NACL 5-0.45 % IV SOLN
INTRAVENOUS | Status: DC
  Administered 2019-09-07: 1000 mL via INTRAVENOUS
  Administered 2019-09-08 (×2): via INTRAVENOUS

## 2019-09-07 MED ORDER — SODIUM CHLORIDE (PF) 0.9 % IJ SOLN
INTRAMUSCULAR | Status: DC | PRN
  Administered 2019-09-07: 20 mL

## 2019-09-07 MED ORDER — PROPOFOL 10 MG/ML IV BOLUS
INTRAVENOUS | Status: DC | PRN
  Administered 2019-09-07: 130 mg via INTRAVENOUS

## 2019-09-07 MED ORDER — PHENYLEPHRINE HCL-NACL 10-0.9 MG/250ML-% IV SOLN
INTRAVENOUS | Status: DC | PRN
  Administered 2019-09-07: 25 ug/min via INTRAVENOUS

## 2019-09-07 MED ORDER — HYDROMORPHONE HCL 1 MG/ML IJ SOLN
INTRAMUSCULAR | Status: AC
  Administered 2019-09-07: 0.5 mg via INTRAVENOUS
  Filled 2019-09-07: qty 1

## 2019-09-07 MED ORDER — DEXAMETHASONE SODIUM PHOSPHATE 10 MG/ML IJ SOLN
INTRAMUSCULAR | Status: AC
  Filled 2019-09-07: qty 1

## 2019-09-07 SURGICAL SUPPLY — 113 items
ADH SKN CLS APL DERMABOND .7 (GAUZE/BANDAGES/DRESSINGS) ×1
AGENT HMST KT MTR STRL THRMB (HEMOSTASIS)
APL ESCP 34 STRL LF DISP (HEMOSTASIS)
APL PRP STRL LF DISP 70% ISPRP (MISCELLANEOUS) ×1
APL SWBSTK 6 STRL LF DISP (MISCELLANEOUS) ×1
APPLICATOR COTTON TIP 6 STRL (MISCELLANEOUS) ×1 IMPLANT
APPLICATOR COTTON TIP 6IN STRL (MISCELLANEOUS) ×3
APPLICATOR SURGIFLO ENDO (HEMOSTASIS) IMPLANT
BAG SPEC RTRVL LRG 6X4 10 (ENDOMECHANICALS)
BAG URO CATCHER STRL LF (MISCELLANEOUS) ×1 IMPLANT
BASKET ZERO TIP NITINOL 2.4FR (BASKET) IMPLANT
BLADE SURG SZ10 CARB STEEL (BLADE) ×1 IMPLANT
BSKT STON RTRVL ZERO TP 2.4FR (BASKET)
CATH FOLEY 3WAY  5CC 18FR (CATHETERS)
CATH FOLEY 3WAY 30CC 22FR (CATHETERS) ×2 IMPLANT
CATH FOLEY 3WAY 5CC 18FR (CATHETERS) IMPLANT
CATH INTERMIT  6FR 70CM (CATHETERS) ×2 IMPLANT
CHLORAPREP W/TINT 26 (MISCELLANEOUS) ×3 IMPLANT
CLIP VESOLOCK LG 6/CT PURPLE (CLIP) ×4 IMPLANT
CLIP VESOLOCK MED LG 6/CT (CLIP) ×2 IMPLANT
CLOTH BEACON ORANGE TIMEOUT ST (SAFETY) ×1 IMPLANT
CONT SPEC 4OZ CLIKSEAL STRL BL (MISCELLANEOUS) ×2 IMPLANT
COVER SURGICAL LIGHT HANDLE (MISCELLANEOUS) ×3 IMPLANT
COVER TIP SHEARS 8 DVNC (MISCELLANEOUS) ×1 IMPLANT
COVER TIP SHEARS 8MM DA VINCI (MISCELLANEOUS) ×2
COVER WAND RF STERILE (DRAPES) IMPLANT
DECANTER SPIKE VIAL GLASS SM (MISCELLANEOUS) ×3 IMPLANT
DERMABOND ADVANCED (GAUZE/BANDAGES/DRESSINGS) ×2
DERMABOND ADVANCED .7 DNX12 (GAUZE/BANDAGES/DRESSINGS) IMPLANT
DRAIN CHANNEL 15F RND FF 3/16 (WOUND CARE) ×3 IMPLANT
DRAIN CHANNEL RND F F (WOUND CARE) IMPLANT
DRAPE ARM DVNC X/XI (DISPOSABLE) ×4 IMPLANT
DRAPE C-ARM 42X120 X-RAY (DRAPES) ×2 IMPLANT
DRAPE COLUMN DVNC XI (DISPOSABLE) ×1 IMPLANT
DRAPE DA VINCI XI ARM (DISPOSABLE) ×8
DRAPE DA VINCI XI COLUMN (DISPOSABLE) ×2
DRAPE SURG IRRIG POUCH 19X23 (DRAPES) ×2 IMPLANT
ELECT REM PT RETURN 15FT ADLT (MISCELLANEOUS) ×3 IMPLANT
GAUZE 4X4 16PLY RFD (DISPOSABLE) IMPLANT
GLOVE BIO SURGEON STRL SZ 6.5 (GLOVE) ×4 IMPLANT
GLOVE BIO SURGEONS STRL SZ 6.5 (GLOVE) ×3
GLOVE BIOGEL M STRL SZ7.5 (GLOVE) ×11 IMPLANT
GOWN STRL REUS W/TWL LRG LVL3 (GOWN DISPOSABLE) ×10 IMPLANT
GUIDEWIRE ANG ZIPWIRE 038X150 (WIRE) ×3 IMPLANT
GUIDEWIRE STR DUAL SENSOR (WIRE) IMPLANT
HOLDER FOLEY CATH W/STRAP (MISCELLANEOUS) ×3 IMPLANT
IRRIG SUCT STRYKERFLOW 2 WTIP (MISCELLANEOUS) ×3
IRRIGATION SUCT STRKRFLW 2 WTP (MISCELLANEOUS) ×1 IMPLANT
KIT TURNOVER KIT A (KITS) IMPLANT
LOOP VESSEL MAXI BLUE (MISCELLANEOUS) ×3 IMPLANT
MANIFOLD NEPTUNE II (INSTRUMENTS) ×3 IMPLANT
NDL INSUFFLATION 14GA 120MM (NEEDLE) IMPLANT
NDL SAFETY ECLIPSE 18X1.5 (NEEDLE) ×1 IMPLANT
NEEDLE HYPO 18GX1.5 SHARP (NEEDLE) ×3
NEEDLE INSUFFLATION 14GA 120MM (NEEDLE) ×3 IMPLANT
PACK CYSTO (CUSTOM PROCEDURE TRAY) ×1 IMPLANT
PACK ROBOT UROLOGY CUSTOM (CUSTOM PROCEDURE TRAY) ×3 IMPLANT
PAD POSITIONING PINK XL (MISCELLANEOUS) ×2 IMPLANT
PLUG CATH AND CAP STER (CATHETERS) ×3 IMPLANT
PORT ACCESS TROCAR AIRSEAL 12 (TROCAR) ×1 IMPLANT
PORT ACCESS TROCAR AIRSEAL 5M (TROCAR) ×2
POUCH SPECIMEN RETRIEVAL 10MM (ENDOMECHANICALS) IMPLANT
SEAL CANN UNIV 5-8 DVNC XI (MISCELLANEOUS) ×4 IMPLANT
SEAL XI 5MM-8MM UNIVERSAL (MISCELLANEOUS) ×8
SET TRI-LUMEN FLTR TB AIRSEAL (TUBING) ×3 IMPLANT
SOLUTION ELECTROLUBE (MISCELLANEOUS) ×3 IMPLANT
SPONGE LAP 18X18 RF (DISPOSABLE) ×1 IMPLANT
STENT CONTOUR 7FRX24X.038 (STENTS) IMPLANT
STENT URET 6FRX22 CONTOUR (STENTS) ×2 IMPLANT
SURGIFLO W/THROMBIN 8M KIT (HEMOSTASIS) IMPLANT
SUT MNCRL 3 0 VIOLET RB1 (SUTURE) IMPLANT
SUT MNCRL AB 4-0 PS2 18 (SUTURE) ×4 IMPLANT
SUT MON AB 2-0 SH 27 (SUTURE)
SUT MON AB 2-0 SH27 (SUTURE) IMPLANT
SUT MONOCRYL 3 0 RB1 (SUTURE) ×6
SUT PDS AB 0 CT1 36 (SUTURE) ×4 IMPLANT
SUT PDS AB 1 CTX 36 (SUTURE) IMPLANT
SUT PDS AB 2-0 CT2 27 (SUTURE) ×4 IMPLANT
SUT PDS AB 3-0 SH 27 (SUTURE) IMPLANT
SUT PDS AB 4-0 RB1 27 (SUTURE) IMPLANT
SUT PDS AB 4-0 SH 27 (SUTURE) IMPLANT
SUT PROLENE 2 0 SH DA (SUTURE) IMPLANT
SUT SILK 0 (SUTURE)
SUT SILK 0 30XBRD TIE 6 (SUTURE) IMPLANT
SUT SILK 2 0 (SUTURE)
SUT SILK 2 0 SH CR/8 (SUTURE) IMPLANT
SUT SILK 2-0 30XBRD TIE 12 (SUTURE) IMPLANT
SUT SILK 3 0 (SUTURE)
SUT SILK 3 0 12 30 (SUTURE) IMPLANT
SUT SILK 3-0 18XBRD TIE 12 (SUTURE) IMPLANT
SUT V-LOC BARB 180 2/0GR6 GS22 (SUTURE) ×3
SUT VIC AB 0 CT1 27 (SUTURE)
SUT VIC AB 0 CT1 27XBRD ANTBC (SUTURE) IMPLANT
SUT VIC AB 2-0 SH 27 (SUTURE)
SUT VIC AB 2-0 SH 27X BRD (SUTURE) IMPLANT
SUT VIC AB 3-0 SH 27 (SUTURE)
SUT VIC AB 3-0 SH 27X BRD (SUTURE) IMPLANT
SUT VIC AB 4-0 RB1 27 (SUTURE)
SUT VIC AB 4-0 RB1 27XBRD (SUTURE) IMPLANT
SUT VIC AB 4-0 SH 18 (SUTURE) IMPLANT
SUT VICRYL 0 UR6 27IN ABS (SUTURE) ×3 IMPLANT
SUT VLOC BARB 180 ABS3/0GR12 (SUTURE) ×6
SUTURE V-LC BRB 180 2/0GR6GS22 (SUTURE) IMPLANT
SUTURE VLOC BRB 180 ABS3/0GR12 (SUTURE) IMPLANT
TROCAR UNIVERSAL OPT 12M 100M (ENDOMECHANICALS) ×3 IMPLANT
TROCAR XCEL 12X100 BLDLESS (ENDOMECHANICALS) ×3 IMPLANT
TROCAR XCEL NON-BLD 5MMX100MML (ENDOMECHANICALS) IMPLANT
TUBING CONNECTING 10 (TUBING) ×1 IMPLANT
TUBING CONNECTING 10' (TUBING)
TUBING UROLOGY SET (TUBING) IMPLANT
URINEMETER 200ML W/220 (MISCELLANEOUS) ×1 IMPLANT
WATER STERILE IRR 1000ML POUR (IV SOLUTION) ×3 IMPLANT
YANKAUER SUCT BULB TIP 10FT TU (MISCELLANEOUS) IMPLANT

## 2019-09-07 NOTE — Addendum Note (Signed)
Addendum  created 09/07/19 1431 by Mitzie Na, CRNA   Intraprocedure Meds edited

## 2019-09-07 NOTE — Op Note (Signed)
NAME: Allison [name], Allison [name] MEDICAL RECORD [id number] ACCOUNT [id number] DATE OF BIRTH:1941/05/21 FACILITY: [facility] LOCATION: [facility] PHYSICIAN:Valari Taylor, MD  OPERATIVE REPORT  DATE OF PROCEDURE:  09/07/2019  PREOPERATIVE DIAGNOSIS:  Right distal ureteral stricture chronic, some question of malignancy.  PROCEDURE: 1.  Cystoscopy, right retrograde pyelogram with interpretation. 2.  Exchange of right ureteral stent 6 x 22 Contour, no tether. 3.  Robotic-assisted laparoscopic right distal ureterectomy and ureteral reimplant. 4.  Pelvic lymph node dissection.  ASSISTANT:  Debbrah Alar, PA  ESTIMATED BLOOD LOSS:  100 mL.  COMPLICATIONS:  None.  SPECIMEN: 1.  Right distal ureter from pathology. 2.  Right external iliac lymph nodes. 3.  Right obturator lymph nodes.  DRAINS:   1.  Jackson-Pratt drain to bulb suction. 2.  Foley catheter to straight drain, 3-way type irrigation port plugged.  FINDINGS: 1.  Right distal ureteral thickening extravesicle visualization narrowing of the lumen with retrograde pyelogram. 2.  Borderline right external iliac and obturator pelvic adenopathy. 3.  Excellent reimplantation of right ureter into dome of the bladder in a psoas hitch type fashion.  INDICATIONS:  This patient is a pleasant 78 year old lady who was found to have a right distal ureteral stricture.  She has preserved right renal parenchyma.  She does have a smoking history.  There is some concern of malignancy in the area.  This area  has been directly visualized and biopsied times several endoscopically and pathologist has favored a benign etiology; however, there was still some clinical suspicion of malignancy not locally advanced.  Options were discussed for management including  recommended path of right distal ureterectomy with reimplantation, pelvic lymph node dissection with goal of closing stricture, becoming stone free and maxillary ruling out neoplasm and treating.  She wished to  proceed.  Informed consent was then placed  and obtained in medical record.  DESCRIPTION OF PROCEDURE:  The patient was identified.  Procedure being right distal ureterectomy reimplant and the dissection was confirmed.  Procedure time out was performed.  Intravenous access administered.  General endotracheal anesthesia induced.   The patient was placed into a low lithotomy position, sterile field was created prepped and draping this vagina, introitus and proximal thighs using iodine and infraxiphoid having using chlorhexidine gluconate.  Her arms were tucked at her side.  She was  further fastened to the table using 3-inch tape over foam padding across the supraxiphoid chest.  Test for steep Trendelenburg was found to be suitable.  She has made flat again.  Initial attention was directed at the right stent exchange.  Next,  cystourethroscopy was performed using 21-French rigid cystoscope with offset lens.  Inspection of bladder revealed no diverticula, calcifications or papillary lesions.  The distal end of right ureteral stent was seen and was grasped and brought to the  level of the urethral meatus.  A 0.038 ZIPwire was advanced to the lower pole and exchanged for open-ended catheter and right pyelogram was obtained.  Right pyelogram demonstrated a single right ureter single system right kidney.  There was narrowing in the distal ureter consistent with a stricture.  This was below the area of the iliac crossing.  The open-ended catheter was exchanged for a new 6 x 22  contour Contour-type stent  using fluoroscopic guidance.  Good proximal and distal plane were noted, and a 22-French 3-way Foley catheter was placed free to straight drain, 10 mL in the balloon.  Irrigation port was plugged.  The patient was repositioned  into steep Trendelenburg and a high-flow,  low-pressure pneumoperitoneum was obtained using Veress technique in the supraumbilical midline having passed the aspiration and drop test.  An 8  mm robotic camera port was then placed in same location.   Laparoscopic examination peritoneal cavity revealed no significant adhesions, no visceral injury.  Additional ports were placed as follows:  Right paramedian 8 mm robotic port, far lateral 12 mm AirSeal assist port, right paramedian 5 mm suction port,  left paramedian 8 mm robotic port, left far lateral 8 mm robotic port.  Robot was docked and passed electronic checks.  Initial attention was directed at development of the right retroperitoneum.  Incision was made lateral to the ascending colon from the  cecum superiorly for a distance of approximately 6 cm and distally just lateral to right medial umbilical ligament towards the area of the internal ring on the right side and the right bladder wall was swept away from the pelvic sidewall.  The round  ligament was purposely divided using medial bucket handle.  This created a large peritoneal flap to visualize the retroperitoneum.  Right ureter was encountered as it coursed over the iliac vessels marked a vessel loop and mobilized proximally for a  distance of approximately 6 cm and then very carefully circumferentially mobilized distally to the area of the ureterovesical junction and keeping a wide swath of periureteral tissue given some concern for malignancy.  The ureter was grossly normal from  an extravesicle visualization for a distance of approximately 2 cm below the iliac crossing, at which point it did become somewhat fibrotic with desmoplastic reaction around it.  The ureterovesical junction area was purposely incised, thus excising the  right ureteral orifice completely and this very small cystotomy through the orifice was closed using 3-0 V-Loc suture followed by a separate running imbricating layer of 3-0 V-Loc and the ureter was then transected.  Again at a distance corresponding to  approximately 1-2 cm distal to the iliac crossing taking exquisite care to avoid displacement of the in  situ stent and the distal ureter was placed into an EndoCatch bag for later retrieval.  With the ureter out of the true pelvis, attention was directed  at lymph node dissection on the right side.  First, the right external iliac group with boundaries being right external iliac vein, pelvic sidewall, artery was achieved with cold clips set aside and labeled right external iliac lymph nodes.  There were  several borderline nodes in this area, less than 2 cm.  Next, the right obturator group was dissected free with the boundaries being right external iliac vein, pelvic sidewall, obturator nerve.  Lymphostasis achieved with cold clips.  SI labeled left  obturator lymph nodes.  Attention was directed at reconstruction.  The space of Retzius was then developed between the medial umbilical ligament, thus dropping the bladder away from the anterior abdominal wall, providing much more mobility on the  bladder.  The right psoas muscle was already exposed from the right retroperitoneal dissection and the bladder was held in place using third arm and making geometry of the psoas hitch.  This was quite favorable at the dome of the bladder being easily  within a distance of reimplantation.  As such, a 2-0 PDS was used in a psoas hitch fashion along the psoas tendon parallel to it into perivesical peritoneum from the dome area thus hitching the bladder to the right psoas muscle.  Next, the bladder was  filled to allow better visualization of the dome area, which was hitched to the  right side.  A small cystotomy was made in this location approximately 1 cm in length.  The right ureter was inspected and was also proximally a centimeter.  A heel stitch  was applied of 3-0 Monocryl and the ureter was anastomosed to the dome of the bladder using 2 separate running suture lines of 3-0 Monocryl and resulted in excellent reanastomosis of the right ureter to dome of the bladder circumferentially.  This was  not any significant  tension.  Hemostasis appeared excellent.  Sponge, needle counts were correct.  We achieved the goals of surgery today.  The right lateral most robotic arm was exchanged for a closed suction drain and the robot then undocked.  The  previous right lateral most 12 mm assistant port site was closed with fascia using Carter-Thomason suture passer and 0 Vicryl.  Specimen was retrieved by extending the previous camera port site superiorly for a distance of approximately 2 cm, removing  the right distal ureter specimen and setting aside for permanent pathology.  Extraction site was closed with fascia using figure-of-eight Vicryl followed by reapproximation of Scarpa's with a running Vicryl.  All incision sites were infiltrated with  dilute lipolyzed Marcaine and closed level skin using subcuticular Monocryl with Dermabond.  The procedure was then terminated.  The patient tolerated the procedure well.  No immediate complications.  The patient was taken to the postanesthesia care in  stable condition.  Please note first assistant Debbrah Alar was crucial for all portions of the surgery today.  She provided invaluable specimen manipulation, retraction, lymphatic clipping and general first assistance.  TN/NUANCE  D:09/07/2019 T:09/07/2019 JOB:[id number]

## 2019-09-07 NOTE — Anesthesia Procedure Notes (Signed)
Procedure Name: Intubation Date/Time: 09/07/2019 7:37 AM Performed by: Mitzie Na, CRNA Pre-anesthesia Checklist: Patient identified, Emergency Drugs available, Suction available and Patient being monitored Patient Re-evaluated:Patient Re-evaluated prior to induction Oxygen Delivery Method: Circle system utilized Preoxygenation: Pre-oxygenation with 100% oxygen Induction Type: IV induction and Rapid sequence Laryngoscope Size: Glidescope and 2 Grade View: Grade II Tube type: Oral Tube size: 7.0 mm Number of attempts: 2 Airway Equipment and Method: Stylet and Oral airway Placement Confirmation: ETT inserted through vocal cords under direct vision,  positive ETCO2 and breath sounds checked- equal and bilateral Secured at: 22 cm Tube secured with: Tape Dental Injury: Injury to lip and Teeth and Oropharynx as per pre-operative assessment  Difficulty Due To: Difficult Airway- due to limited oral opening

## 2019-09-07 NOTE — Anesthesia Postprocedure Evaluation (Signed)
Anesthesia Post Note  Patient: DANIS CIANO  Procedure(s) Performed: XI ROBOTICALLY ASSISTED LAPAROSCOPIC DISTA L URETERECTOMY AND  RE-IMPLANTATION WITH RIGHT PELVIC LYMPHADENOPATHY (Right ) CYSTOSCOPY WITH STENT REPLACEMENT WITH RETROGRADES (Right )     Patient location during evaluation: PACU Anesthesia Type: General Level of consciousness: awake and alert Pain management: pain level controlled Vital Signs Assessment: post-procedure vital signs reviewed and stable Respiratory status: spontaneous breathing, nonlabored ventilation, respiratory function stable and patient connected to nasal cannula oxygen Cardiovascular status: blood pressure returned to baseline and stable Postop Assessment: no apparent nausea or vomiting Anesthetic complications: no    Last Vitals:  Vitals:   09/07/19 1200 09/07/19 1215  BP:  138/65  Pulse: 79 77  Resp: 10 15  Temp: (!) 36.2 C   SpO2: 99% 96%    Last Pain:  Vitals:   09/07/19 1215  TempSrc:   PainSc: Asleep                 Daniele Dillow,W. EDMOND

## 2019-09-07 NOTE — Transfer of Care (Signed)
Immediate Anesthesia Transfer of Care Note  Patient: Allison [name]  Procedure(s) Performed: XI ROBOTICALLY ASSISTED LAPAROSCOPIC DISTA L URETERECTOMY AND  RE-IMPLANTATION WITH RIGHT PELVIC LYMPHADENOPATHY (Right ) CYSTOSCOPY WITH STENT REPLACEMENT WITH RETROGRADES (Right )  Patient Location: PACU  Anesthesia Type:General  Level of Consciousness: awake, alert , oriented and patient cooperative  Airway & Oxygen Therapy: Patient Spontanous Breathing and Patient connected to face mask oxygen  Post-op Assessment: Report given to RN, Post -op Vital signs reviewed and stable and Patient moving all extremities  Post vital signs: Reviewed and stable  Last Vitals:  Vitals Value Taken Time  BP    Temp    Pulse 83 09/07/19 1136  Resp 15 09/07/19 1136  SpO2 96 % 09/07/19 1136  Vitals shown include unvalidated device data.  Last Pain:  Vitals:   09/07/19 0601  TempSrc: Oral  PainSc:       Patients Stated Pain Goal: 4 ([id number])  Complications: No apparent anesthesia complications

## 2019-09-07 NOTE — H&P (Signed)
Allison [name] is an 78 y.o. female.    Chief Complaint: Pre-op RIGHT Robotic Ureteral Reimplant  HPI:   1 - Recurrent Cystitis - few episodes per year of simple cystitis. Symptoms irritative voiding. No pyelo hospitalizations. UCX mixed x many / non-clonal. CT 2020 with Rt hydro as per below. PVR "55mL" (normal). No glycosuria. UCX 08/2019 MDR e. coli sens macrobid, zosyn.   2 - Right Hydronephrosis / Rule Out Ureteral Cancer / Right Distal Ureteral Stricture - right moderate hydro with some mild cortical thinning down to enhancinc / thickened distal 1/4 of ureter by CT 2020. Cysto w/o overt bladder lesions, but difficult. Remote 20PY smoker. 1 artery / 2 vein (upper accessory) Rt renovascular anatomy. Cr 1.0, contralateral kidney normal. Opertive biopsy / ureteroscopy 06/2019 with significant distal stricture and BX / brushing favored benign, 5x22 stent left. Entire process appears below iliacs by ureteroscopy and imaging.   PMH sig for mild obesity, TAH (precancerous), Appy. NO CV disease / blood thinners. 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 Her PCP is Allison Alexanders MD.   Today "Allison [name]" is seen to proceed with RIGHT ureteral reimplant. She has been on macrobid to reduce known urinary colonization. NO interval fevers. C19 screen negative.   Past Medical History:  Diagnosis Date  . Arthritis    lower back  . Former smoker   . GERD (gastroesophageal reflux disease)   . History of appendicitis   . History of blood transfusion 1960   after delivery of son  . History of cataract   . History of kidney stones 1999   lithrotripsy  . Hypercholesterolemia   . Hypertension   . Hypothyroid   . Iron deficiency anemia   . Macular degeneration    bilateral  . Pinched nerve    back  . Seasonal allergies    RHINITIS  . Tachycardia   . Wears glasses    reading    Past Surgical History:  Procedure Laterality Date  . ABDOMINAL HYSTERECTOMY    .  APPENDECTOMY    . cataract surgery Bilateral 03/2014   polyps  . COLONOSCOPY  02/2010  . CYSTOSCOPY/RETROGRADE/URETEROSCOPY Bilateral 07/18/2019   Procedure: CYSTOSCOPY/RETROGRADE/RIGHT URETEROSCOPY/BLADDER BIOPSY/STENT PLACEMENT/RIGHT URETERAL MASS BIOPSY;  Surgeon: Allison Frock, MD;  Location: [facility];  Service: Urology;  Laterality: Bilateral;  1 HR    Family History  Problem Relation Age of Onset  . Arthritis Mother   . Diabetes Mother   . Arthritis Father   . Diabetes Father   . Hyperlipidemia Father   . Colon cancer Brother 53  . Colon polyps Brother 57  . Cancer Paternal Grandmother   . Heart failure Sister   . Hyperlipidemia Sister   . Rectal cancer Neg Hx   . Stomach cancer Neg Hx   . Esophageal cancer Neg Hx    Social History:  reports that she quit smoking about 25 years ago. Her smoking use included cigarettes. She has a 20.00 pack-year smoking history. She has never used smokeless tobacco. She reports current alcohol use of about 5.0 standard drinks of alcohol per week. She reports that she does not use drugs.  Allergies:  Allergies  Allergen Reactions  . Niacin Rash    Medications Prior to Admission  Medication Sig Dispense Refill  . aspirin 81 MG tablet Take 81 mg by mouth daily.    . beta carotene w/minerals (OCUVITE) tablet Take 2 tablets by mouth daily.     Marland Kitchen  levothyroxine (SYNTHROID) 75 MCG tablet Take 1 tablet (75 mcg total) by mouth daily. 90 tablet 3  . losartan-hydrochlorothiazide (HYZAAR) 100-25 MG tablet Take 1 tablet by mouth daily. 90 tablet 0  . omeprazole (PRILOSEC) 20 MG capsule Take 1 capsule (20 mg total) by mouth daily. 90 capsule 3  . simvastatin (ZOCOR) 40 MG tablet Take 1 tablet (40 mg total) by mouth daily. 90 tablet 3  . thyroid (ARMOUR THYROID) 60 MG tablet Take 1 tablet (60 mg total) by mouth daily before breakfast. 90 tablet 0  . traMADol (ULTRAM) 50 MG tablet Take 1-2 tablets (50-100 mg total) by mouth every 6 (six)  hours as needed for moderate pain or severe pain. Post-operatively (Patient not taking: Reported on 08/27/2019) 20 tablet 0    No results found for this or any previous visit (from the past 48 hour(s)). No results found.  Review of Systems  Constitutional: Negative for chills and fever.  HENT: Negative.   Eyes: Negative.   Respiratory: Negative.   Cardiovascular: Negative.   Gastrointestinal: Negative.   Genitourinary: Positive for urgency.  Musculoskeletal: Negative.   Skin: Negative.   Neurological: Negative.   Endo/Heme/Allergies: Negative.   Psychiatric/Behavioral: Negative.     Blood pressure (!) 144/65, pulse 88, temperature 97.7 F (36.5 C), temperature source Oral, resp. rate 18, height 5\' 5"  (1.651 m), weight 80.1 kg, SpO2 97 %. Physical Exam  Constitutional: She appears well-developed.  HENT:  Head: Normocephalic.  Eyes: Pupils are equal, round, and reactive to light.  Neck: Normal range of motion.  Cardiovascular: Normal rate.  Respiratory: Effort normal.  GI: Soft.  Prior scars w/o hernias.   Genitourinary:    Genitourinary Comments: No CVAT at present   Musculoskeletal: Normal range of motion.  Neurological: She is alert.  Skin: Skin is warm.  Psychiatric: She has a normal mood and affect.     Assessment/Plan  Proceed as planned with RIGHT ureteral reimplant / distal excision. Risks, benefits, alternatives, expected peri-op course discussed previously and reiterated today.   Allison Frock, MD 09/07/2019, 6:55 AM

## 2019-09-07 NOTE — Discharge Instructions (Signed)

## 2019-09-07 NOTE — Brief Op Note (Signed)
09/07/2019  11:11 AM  PATIENT:  Allison [name]  78 y.o. female  PRE-OPERATIVE DIAGNOSIS:  RIGHT DISTAL URETERAL STRICTURE  POST-OPERATIVE DIAGNOSIS:  RIGHT DISTAL URETERAL STRICTURE  PROCEDURE:  Procedure(s) with comments: XI ROBOTICALLY ASSISTED LAPAROSCOPIC URETERAL RE-IMPLANTATION (Right) - 3 HRS CYSTOSCOPY WITH STENT REPLACEMENT (Right)  Rt distal ureterectomy Pelvic lymph node dissection  SURGEON:  Surgeon(s) and Role:    Alexis Frock, MD - Primary  PHYSICIAN ASSISTANT:   ASSISTANTS: Debbrah Alar PA   ANESTHESIA:   local and general  EBL:  118mL   BLOOD ADMINISTERED:none  DRAINS: 1 - JP to bulb; 2 - Foley to gravity   LOCAL MEDICATIONS USED:  MARCAINE     SPECIMEN:  Source of Specimen:  Rt distal ureter; Rt pelvic lymph nodes  DISPOSITION OF SPECIMEN:  PATHOLOGY  COUNTS:  YES  TOURNIQUET:  * No tourniquets in log *  DICTATION: .Other Dictation: Dictation Number  [id number]  PLAN OF CARE: Admit to inpatient   PATIENT DISPOSITION:  PACU - hemodynamically stable.   Delay start of Pharmacological VTE agent (>24hrs) due to surgical blood loss or risk of bleeding: yes

## 2019-09-07 NOTE — Progress Notes (Signed)
Urology Progress Note   Day of Surgery from robotic right distal ureterectomy with ureteral reimplant/psoas hitch and right pelvic lymph node disection.   Subjective: Transferred to the floor.  Hgb 11.3 in PACU from 12.1 preop Overall feeling well; sore w movement JP SS UOP pale pink without clots, expected No nausea.   Objective: Vital signs in last 24 hours: Temp:  [97.1 F (36.2 C)-97.7 F (36.5 C)] 97.7 F (36.5 C) (11/13 1249) Pulse Rate:  [73-88] 81 (11/13 1437) Resp:  [10-18] 16 (11/13 1437) BP: (127-144)/(57-68) 130/68 (11/13 1437) SpO2:  [96 %-100 %] 98 % (11/13 1437) Weight:  [80.1 kg] 80.1 kg (11/13 0554)  Intake/Output from previous day: No intake/output data recorded. Intake/Output this shift: Total I/O In: 2150 [I.V.:2000; IV Piggyback:150] Out: 220 [Drains:120; Blood:100]  Physical Exam:  General: Alert and oriented CV: Regular rate Lungs: No increased work of breathing Abdomen: Soft, appropriately tender. Incisions c/d/i. JP SS GU: Foley in place draining clear pale pink urine  Ext: NT, No erythema  Lab Results: Recent Labs    09/07/19 1141  HGB 11.3*  HCT 36.1   No results for input(s): NA, K, CL, CO2, GLUCOSE, BUN, CREATININE, CALCIUM in the last 72 hours.  Studies/Results: Dg C-arm 1-60 Min-no Report  Result Date: 09/07/2019 Fluoroscopy was utilized by the requesting physician.  No radiographic interpretation.    Assessment/Plan:  78 y.o. female s/p robotic right distal ureterectomy with ureteral reimplant/psoas hitch and right pelvic lymph node disection.  Overall doing well post-op.   - Continue Foley catheter and JP drain - CLD - mIVF@75ml /hr - AM labs - OOB and walking this evening  Dispo: Pending    LOS: 0 days

## 2019-09-08 ENCOUNTER — Encounter (HOSPITAL_COMMUNITY): Payer: Self-pay | Admitting: Urology

## 2019-09-08 LAB — HEMOGLOBIN AND HEMATOCRIT, BLOOD
HCT: 35.7 % — ABNORMAL LOW (ref 36.0–46.0)
Hemoglobin: 11.1 g/dL — ABNORMAL LOW (ref 12.0–15.0)

## 2019-09-08 LAB — BASIC METABOLIC PANEL
Anion gap: 11 (ref 5–15)
BUN: 17 mg/dL (ref 8–23)
CO2: 24 mmol/L (ref 22–32)
Calcium: 9.2 mg/dL (ref 8.9–10.3)
Chloride: 103 mmol/L (ref 98–111)
Creatinine, Ser: 1.26 mg/dL — ABNORMAL HIGH (ref 0.44–1.00)
GFR calc Af Amer: 47 mL/min — ABNORMAL LOW (ref >60)
GFR calc non Af Amer: 40 mL/min — ABNORMAL LOW (ref >60)
Glucose, Bld: 146 mg/dL — ABNORMAL HIGH (ref 70–99)
Potassium: 4.3 mmol/L (ref 3.5–5.1)
Sodium: 138 mmol/L (ref 135–145)

## 2019-09-08 NOTE — Progress Notes (Signed)
1 Day Post-Op Subjective: Patient reports she is feeling fairly well.  Mild abdominal soreness.  She states that she has had some flatus.  No nausea or vomiting.  Objective: Vital signs in last 24 hours: Temp:  [97.1 F (36.2 C)-98.4 F (36.9 C)] 98.4 F (36.9 C) (11/14 0403) Pulse Rate:  [69-82] 69 (11/14 0403) Resp:  [10-17] 17 (11/13 2357) BP: (111-138)/(49-68) 117/63 (11/14 0403) SpO2:  [89 %-100 %] 89 % (11/14 0403)  Intake/Output from previous day: 11/13 0701 - 11/14 0700 In: 2442.5 [I.V.:2292.5; IV Piggyback:150] Out: [id number]; Drains:230; Blood:100] Intake/Output this shift: No intake/output data recorded.  Physical Exam:  Constitutional: Vital signs reviewed. WD WN in NAD   Eyes: PERRL, No scleral icterus.   Cardiovascular: RRR Pulmonary/Chest: Normal effort Abdominal: Soft.  Appropriately tender.  Incision sites all appear intact/dry.  No significant tympany. Extremities: No cyanosis or edema   Lab Results: Recent Labs    09/07/19 1141 09/08/19 0522  HGB 11.3* 11.1*  HCT 36.1 35.7*   BMET Recent Labs    09/08/19 0522  NA 138  K 4.3  CL 103  CO2 24  GLUCOSE 146*  BUN 17  CREATININE 1.26*  CALCIUM 9.2   No results for input(s): LABPT, INR in the last 72 hours. No results for input(s): LABURIN in the last 72 hours. Results for orders placed or performed during the hospital encounter of 09/04/19  Novel Coronavirus, NAA (Hosp order, Send-out to Ref Lab; TAT 18-24 hrs     Status: None   Collection Time: 09/04/19 11:22 AM   Specimen: Nasopharyngeal Swab; Respiratory  Result Value Ref Range Status   SARS-CoV-2, NAA NOT DETECTED NOT DETECTED Final    Comment: (NOTE) This nucleic acid amplification test was developed and its performance characteristics determined by [employer]. Nucleic acid amplification tests include PCR and TMA. This test has not been FDA cleared or approved. This test has been authorized by FDA under an Emergency  Use Authorization (EUA). This test is only authorized for the duration of time the declaration that circumstances exist justifying the authorization of the emergency use of in vitro diagnostic tests for detection of SARS-CoV-2 virus and/or diagnosis of COVID-19 infection under section 564(b)(1) of the Act, 21 U.S.C. [id number]) (1), unless the authorization is terminated or revoked sooner. When diagnostic testing is negative, the possibility of a false negative result should be considered in the context of a patient's recent exposures and the presence of clinical signs and symptoms consistent with COVID-19. An individual without symptoms of COVID- 19 and who is not shedding SARS-CoV-2 vi rus would expect to have a negative (not detected) result in this assay. Performed At: [facility] [address] [place], [place] [id number] Rush Farmer MD [id number]    [place]  Final    Comment: Performed at [facility], [place] [address] [place], [place] [address]    Studies/Results: Dg C-arm 1-60 Min-no Report  Result Date: 09/07/2019 Fluoroscopy was utilized by the requesting physician.  No radiographic interpretation.    Assessment/Plan:   Postoperative day 1 following right distal ureterectomy, lymph node dissection, reimplant.  She is doing well, but although I do not think ready to go home yet.    I will start her on a regular diet.  Continue ambulation.  Possible discharge tomorrow morning.   LOS: 1 day   Jorja Loa 09/08/2019, 8:58 AM

## 2019-09-09 LAB — CREATININE, FLUID (PLEURAL, PERITONEAL, JP DRAINAGE): Creat, Fluid: 1.1 mg/dL

## 2019-09-09 MED ORDER — TRAMADOL HCL 50 MG PO TABS: 50.0000 mg | ORAL_TABLET | Freq: Four times a day (QID) | ORAL | 0 refills | Status: DC | PRN

## 2019-09-10 NOTE — Discharge Summary (Signed)
Patient ID: SUTTER DRILLING MRN: [id number] DOB/AGE: 06/11/1941 78 y.o.  Admit date: 09/07/2019 Discharge date: 09/10/2019  Primary Care Physician:  Denita Lung, MD  Discharge Diagnoses:  Rt ureteral stricture Present on Admission: . Ureteral stricture    Discharge Medications: Allergies as of 09/09/2019      Reactions   Niacin Rash      Medication List    STOP taking these medications   aspirin 81 MG tablet   beta carotene w/minerals tablet     TAKE these medications   levothyroxine 75 MCG tablet Commonly known as: SYNTHROID Take 1 tablet (75 mcg total) by mouth daily.   losartan-hydrochlorothiazide 100-25 MG tablet Commonly known as: HYZAAR Take 1 tablet by mouth daily.   omeprazole 20 MG capsule Commonly known as: PRILOSEC Take 1 capsule (20 mg total) by mouth daily.   simvastatin 40 MG tablet Commonly known as: ZOCOR Take 1 tablet (40 mg total) by mouth daily.   thyroid 60 MG tablet Commonly known as: Armour Thyroid Take 1 tablet (60 mg total) by mouth daily before breakfast.   traMADol 50 MG tablet Commonly known as: Ultram Take 1-2 tablets (50-100 mg total) by mouth every 6 (six) hours as needed for moderate pain or severe pain. Post-operatively What changed: Another medication with the same name was added. Make sure you understand how and when to take each.   traMADol 50 MG tablet Commonly known as: Ultram Take 1 tablet (50 mg total) by mouth every 6 (six) hours as needed. Sent to [facility] What changed: You were already taking a medication with the same name, and this prescription was added. Make sure you understand how and when to take each.        Significant Diagnostic Studies:  No results found.  Brief H and P: For complete details please refer to admission H and P, but in brief 78 year old female with hydronephrosis secondary to right distal ureteral stricture/process, admitted for robotic assisted distal ureterectomy with  reimplant.  Hospital Course: The patient was admitted directly to the operating room where robotic assisted distal ureterectomy, ureteroneocystostomy and psoas hitch was performed by Dr. Tresa Moore.  She had an uncomplicated postoperative course.  She was advanced to a regular diet, ambulated, and was discharged home without complications on postoperative day #2 with Foley in place. Active Problems:   Ureteral stricture   Day of Discharge BP 106/74   Pulse 96   Temp 98.4 F (36.9 C) (Oral)   Resp 20   Ht 5\' 5"  (1.651 m)   Wt 80.1 kg   SpO2 96%   BMI 29.37 kg/m   Results for orders placed or performed during the hospital encounter of 09/07/19 (from the past 24 hour(s))  Creatinine, fluid (JP Drainage)     Status: None   Collection Time: 09/09/19  8:50 AM  Result Value Ref Range   Creat, Fluid 1.1 mg/dL   Fluid Type-FCRE PERITONEAL CAVITY     Physical Exam: General: Alert and awake oriented x3 not in any acute distress. HEENT: anicteric sclera, pupils reactive to light and accommodation CVS: S1-S2 clear no murmur rubs or gallops Chest: clear to auscultation bilaterally, no wheezing rales or rhonchi Abdomen: soft nontender, nondistended, normal bowel sounds, no organomegaly Extremities: no cyanosis, clubbing or edema noted bilaterally Neuro: Cranial nerves II-XII intact, no focal neurological deficits  Disposition: Home  Diet: Regular  Activity: Activity restrictions discussed with patient   Disposition and Follow-up:  Discharge Instructions    Care order/instruction  Complete by: As directed    Remove drain prior to d/c         TESTS THAT NEED FOLLOW-UP  Pathology review  [place]    Alexis Frock, MD On 09/17/2019.   Specialty: Urology Why: at 1:30 for X-Ray and MD visit.  Contact information: [place] [phone]           Time spent on Discharge:  10 minutes  Signed: Lillette Boxer  Tajia [name] 09/10/2019, 7:31 AM

## 2019-09-11 LAB — SURGICAL PATHOLOGY

## 2019-09-17 DIAGNOSIS — N3020 Other chronic cystitis without hematuria: Secondary | ICD-10-CM | POA: Diagnosis not present

## 2019-09-19 ENCOUNTER — Other Ambulatory Visit: Payer: Self-pay

## 2019-10-01 ENCOUNTER — Telehealth: Payer: Self-pay

## 2019-10-01 NOTE — Telephone Encounter (Signed)
Received a fax from [facility] . Pt advised she will  stay with armothyroid for now. [place]

## 2019-10-09 DIAGNOSIS — N3020 Other chronic cystitis without hematuria: Secondary | ICD-10-CM | POA: Diagnosis not present

## 2019-10-29 ENCOUNTER — Other Ambulatory Visit: Payer: Self-pay

## 2019-10-30 ENCOUNTER — Telehealth: Payer: Self-pay

## 2019-10-30 ENCOUNTER — Other Ambulatory Visit: Payer: Self-pay

## 2019-10-30 DIAGNOSIS — E039 Hypothyroidism, unspecified: Secondary | ICD-10-CM

## 2019-10-30 MED ORDER — THYROID 60 MG PO TABS: 60.0000 mg | ORAL_TABLET | Freq: Every day | ORAL | 0 refills | Status: DC

## 2019-10-30 NOTE — Telephone Encounter (Signed)
Received a fax from [facility] order pharmacy for a refill on the pts. Armour Thyroid 60 mg pt. Last apt. 08/01/19

## 2019-11-04 DIAGNOSIS — Z20828 Contact with and (suspected) exposure to other viral communicable diseases: Secondary | ICD-10-CM | POA: Diagnosis not present

## 2019-11-04 DIAGNOSIS — R05 Cough: Secondary | ICD-10-CM | POA: Diagnosis not present

## 2019-11-04 DIAGNOSIS — Z20822 Contact with and (suspected) exposure to covid-19: Secondary | ICD-10-CM | POA: Diagnosis not present

## 2019-11-06 ENCOUNTER — Telehealth: Payer: Self-pay | Admitting: Family Medicine

## 2019-11-06 NOTE — Telephone Encounter (Signed)
Pt left voice mail that she had covid test on Sunday at "the [facility] she [facility] ever [facility]"  Her husband was actually the one sick but they were both tested. He came back negative and she is positive  She wants to speak with someone and she wants to know where she can go to get rapid test done

## 2019-11-07 NOTE — Telephone Encounter (Signed)
yes

## 2019-11-07 NOTE — Telephone Encounter (Signed)
  Did you talk to Allison [name], it was still showing open and I did not see any notes

## 2019-11-13 ENCOUNTER — Ambulatory Visit: Payer: PPO | Attending: Internal Medicine

## 2019-11-13 DIAGNOSIS — Z23 Encounter for immunization: Secondary | ICD-10-CM

## 2019-11-13 NOTE — Progress Notes (Signed)
   Covid-19 Vaccination Clinic  Name:  Allison [name]    MRN: [id number] DOB: 11-04-1940  11/13/2019  Allison [name] was observed post Covid-19 immunization for 15 minutes without incidence. She was provided with Vaccine Information Sheet and instruction to access the V-Safe system.   Allison [name] was instructed to call 911 with any severe reactions post vaccine: Marland Kitchen Difficulty breathing  . Swelling of your face and throat  . A fast heartbeat  . A bad rash all over your body  . Dizziness and weakness    Immunizations Administered    Name Date Dose VIS Date Route   Pfizer COVID-19 Vaccine 11/13/2019 10:28 AM 0.3 mL 10/05/2019 Intramuscular   Manufacturer: [place]   Lot: [id number]   [place]: [id number]

## 2019-11-19 ENCOUNTER — Telehealth: Payer: Self-pay | Admitting: Family Medicine

## 2019-11-19 NOTE — Telephone Encounter (Signed)
Have her take double dose of Prilosec for the next several days and see what that will do

## 2019-11-19 NOTE — Telephone Encounter (Signed)
Pt called and said she was positive for covid about 13 days ago but feels like she is chocking. She said its a tight feeling in her chest and stomach and she has been taking Pepto but is has not helped. She wanted to see if you could send something to her pharmacy. She declined virtual visit.

## 2019-11-20 NOTE — Telephone Encounter (Signed)
Pt. Called back to see why Dr. Redmond School had not called her back yet, I gave her the message to double up on the Prilosec for the next several days to see if that helps.

## 2019-11-21 ENCOUNTER — Emergency Department (HOSPITAL_COMMUNITY): Payer: PPO

## 2019-11-21 ENCOUNTER — Other Ambulatory Visit: Payer: Self-pay

## 2019-11-21 ENCOUNTER — Encounter (HOSPITAL_COMMUNITY): Payer: Self-pay | Admitting: Emergency Medicine

## 2019-11-21 ENCOUNTER — Inpatient Hospital Stay (HOSPITAL_COMMUNITY)
Admission: EM | Admit: 2019-11-21 | Discharge: 2019-11-23 | DRG: 640 | Disposition: A | Discharge status: Home / Self Care | Payer: PPO | Attending: Internal Medicine | Admitting: Internal Medicine

## 2019-11-21 DIAGNOSIS — Z8249 Family history of ischemic heart disease and other diseases of the circulatory system: Secondary | ICD-10-CM | POA: Diagnosis not present

## 2019-11-21 DIAGNOSIS — I248 Other forms of acute ischemic heart disease: Secondary | ICD-10-CM | POA: Diagnosis not present

## 2019-11-21 DIAGNOSIS — N182 Chronic kidney disease, stage 2 (mild): Secondary | ICD-10-CM | POA: Diagnosis present

## 2019-11-21 DIAGNOSIS — Z90710 Acquired absence of both cervix and uterus: Secondary | ICD-10-CM

## 2019-11-21 DIAGNOSIS — Z960 Presence of urogenital implants: Secondary | ICD-10-CM | POA: Diagnosis not present

## 2019-11-21 DIAGNOSIS — I129 Hypertensive chronic kidney disease with stage 1 through stage 4 chronic kidney disease, or unspecified chronic kidney disease: Secondary | ICD-10-CM | POA: Diagnosis not present

## 2019-11-21 DIAGNOSIS — R531 Weakness: Secondary | ICD-10-CM

## 2019-11-21 DIAGNOSIS — Z79899 Other long term (current) drug therapy: Secondary | ICD-10-CM

## 2019-11-21 DIAGNOSIS — Z79891 Long term (current) use of opiate analgesic: Secondary | ICD-10-CM | POA: Diagnosis not present

## 2019-11-21 DIAGNOSIS — E039 Hypothyroidism, unspecified: Secondary | ICD-10-CM | POA: Diagnosis not present

## 2019-11-21 DIAGNOSIS — E876 Hypokalemia: Secondary | ICD-10-CM | POA: Diagnosis not present

## 2019-11-21 DIAGNOSIS — W19XXXA Unspecified fall, initial encounter: Secondary | ICD-10-CM | POA: Diagnosis present

## 2019-11-21 DIAGNOSIS — Z7289 Other problems related to lifestyle: Secondary | ICD-10-CM

## 2019-11-21 DIAGNOSIS — I10 Essential (primary) hypertension: Secondary | ICD-10-CM | POA: Diagnosis not present

## 2019-11-21 DIAGNOSIS — Z79890 Hormone replacement therapy (postmenopausal): Secondary | ICD-10-CM | POA: Diagnosis not present

## 2019-11-21 DIAGNOSIS — Z87891 Personal history of nicotine dependence: Secondary | ICD-10-CM

## 2019-11-21 DIAGNOSIS — R55 Syncope and collapse: Secondary | ICD-10-CM | POA: Diagnosis not present

## 2019-11-21 DIAGNOSIS — H3530 Unspecified macular degeneration: Secondary | ICD-10-CM | POA: Diagnosis not present

## 2019-11-21 DIAGNOSIS — J302 Other seasonal allergic rhinitis: Secondary | ICD-10-CM | POA: Diagnosis not present

## 2019-11-21 DIAGNOSIS — E7800 Pure hypercholesterolemia, unspecified: Secondary | ICD-10-CM | POA: Diagnosis not present

## 2019-11-21 DIAGNOSIS — Z8349 Family history of other endocrine, nutritional and metabolic diseases: Secondary | ICD-10-CM | POA: Diagnosis not present

## 2019-11-21 DIAGNOSIS — I951 Orthostatic hypotension: Secondary | ICD-10-CM | POA: Diagnosis not present

## 2019-11-21 DIAGNOSIS — S0990XA Unspecified injury of head, initial encounter: Secondary | ICD-10-CM | POA: Diagnosis not present

## 2019-11-21 DIAGNOSIS — U071 COVID-19: Secondary | ICD-10-CM | POA: Diagnosis present

## 2019-11-21 DIAGNOSIS — R296 Repeated falls: Secondary | ICD-10-CM | POA: Diagnosis not present

## 2019-11-21 DIAGNOSIS — R918 Other nonspecific abnormal finding of lung field: Secondary | ICD-10-CM | POA: Diagnosis not present

## 2019-11-21 DIAGNOSIS — E860 Dehydration: Secondary | ICD-10-CM | POA: Diagnosis not present

## 2019-11-21 DIAGNOSIS — K219 Gastro-esophageal reflux disease without esophagitis: Secondary | ICD-10-CM | POA: Diagnosis present

## 2019-11-21 DIAGNOSIS — Z833 Family history of diabetes mellitus: Secondary | ICD-10-CM | POA: Diagnosis not present

## 2019-11-21 DIAGNOSIS — R42 Dizziness and giddiness: Secondary | ICD-10-CM | POA: Diagnosis not present

## 2019-11-21 DIAGNOSIS — R5381 Other malaise: Secondary | ICD-10-CM | POA: Diagnosis not present

## 2019-11-21 DIAGNOSIS — N179 Acute kidney failure, unspecified: Secondary | ICD-10-CM | POA: Diagnosis not present

## 2019-11-21 DIAGNOSIS — S0511XA Contusion of eyeball and orbital tissues, right eye, initial encounter: Secondary | ICD-10-CM | POA: Diagnosis not present

## 2019-11-21 DIAGNOSIS — Z888 Allergy status to other drugs, medicaments and biological substances status: Secondary | ICD-10-CM

## 2019-11-21 DIAGNOSIS — S0083XA Contusion of other part of head, initial encounter: Secondary | ICD-10-CM | POA: Diagnosis not present

## 2019-11-21 LAB — TROPONIN I (HIGH SENSITIVITY)
Troponin I (High Sensitivity): 40 ng/L — ABNORMAL HIGH (ref <18)
Troponin I (High Sensitivity): 41 ng/L — ABNORMAL HIGH (ref <18)

## 2019-11-21 LAB — CBC WITH DIFFERENTIAL/PLATELET
Abs Immature Granulocytes: 0.07 10*3/uL (ref 0.00–0.07)
Basophils Absolute: 0 10*3/uL (ref 0.0–0.1)
Basophils Relative: 0 %
Eosinophils Absolute: 0.1 10*3/uL (ref 0.0–0.5)
Eosinophils Relative: 1 %
HCT: 44.4 % (ref 36.0–46.0)
Hemoglobin: 14.5 g/dL (ref 12.0–15.0)
Immature Granulocytes: 1 %
Lymphocytes Relative: 8 %
Lymphs Abs: 1 10*3/uL (ref 0.7–4.0)
MCH: 30.7 pg (ref 26.0–34.0)
MCHC: 32.7 g/dL (ref 30.0–36.0)
MCV: 94.1 fL (ref 80.0–100.0)
Monocytes Absolute: 0.8 10*3/uL (ref 0.1–1.0)
Monocytes Relative: 7 %
Neutro Abs: 9.8 10*3/uL — ABNORMAL HIGH (ref 1.7–7.7)
Neutrophils Relative %: 83 %
Platelets: UNDETERMINED 10*3/uL (ref 150–400)
RBC: 4.72 MIL/uL (ref 3.87–5.11)
RDW: 13.8 % (ref 11.5–15.5)
WBC: 11.7 10*3/uL — ABNORMAL HIGH (ref 4.0–10.5)
nRBC: 0 % (ref 0.0–0.2)

## 2019-11-21 LAB — COMPREHENSIVE METABOLIC PANEL
ALT: 18 U/L (ref 0–44)
AST: 32 U/L (ref 15–41)
Albumin: 3.5 g/dL (ref 3.5–5.0)
Alkaline Phosphatase: 48 U/L (ref 38–126)
Anion gap: 16 — ABNORMAL HIGH (ref 5–15)
BUN: 42 mg/dL — ABNORMAL HIGH (ref 8–23)
CO2: 21 mmol/L — ABNORMAL LOW (ref 22–32)
Calcium: 9.5 mg/dL (ref 8.9–10.3)
Chloride: 103 mmol/L (ref 98–111)
Creatinine, Ser: 1.41 mg/dL — ABNORMAL HIGH (ref 0.44–1.00)
GFR calc Af Amer: 41 mL/min — ABNORMAL LOW (ref >60)
GFR calc non Af Amer: 35 mL/min — ABNORMAL LOW (ref >60)
Glucose, Bld: 145 mg/dL — ABNORMAL HIGH (ref 70–99)
Potassium: 3.5 mmol/L (ref 3.5–5.1)
Sodium: 140 mmol/L (ref 135–145)
Total Bilirubin: 1.2 mg/dL (ref 0.3–1.2)
Total Protein: 7.4 g/dL (ref 6.5–8.1)

## 2019-11-21 LAB — URINALYSIS, ROUTINE W REFLEX MICROSCOPIC
Bilirubin Urine: NEGATIVE
Glucose, UA: NEGATIVE mg/dL
Ketones, ur: 5 mg/dL — AB
Nitrite: NEGATIVE
Protein, ur: 100 mg/dL — AB
RBC / HPF: >50 RBC/hpf — ABNORMAL HIGH (ref 0–5)
Specific Gravity, Urine: 1.026 (ref 1.005–1.030)
WBC, UA: >50 WBC/hpf — ABNORMAL HIGH (ref 0–5)
pH: 5 (ref 5.0–8.0)

## 2019-11-21 LAB — RESPIRATORY PANEL BY RT PCR (FLU A&B, COVID)
Influenza A by PCR: NEGATIVE
Influenza B by PCR: NEGATIVE
SARS Coronavirus 2 by RT PCR: POSITIVE — AB

## 2019-11-21 LAB — CBG MONITORING, ED: Glucose-Capillary: 120 mg/dL — ABNORMAL HIGH (ref 70–99)

## 2019-11-21 LAB — CK: Total CK: 66 U/L (ref 38–234)

## 2019-11-21 LAB — D-DIMER, QUANTITATIVE: D-Dimer, Quant: 1.75 ug/mL-FEU — ABNORMAL HIGH (ref 0.00–0.50)

## 2019-11-21 MED ORDER — PANTOPRAZOLE SODIUM 40 MG PO TBEC
40.0000 mg | DELAYED_RELEASE_TABLET | Freq: Every day | ORAL | Status: DC
  Administered 2019-11-22 – 2019-11-23 (×2): 40 mg via ORAL
  Filled 2019-11-21 (×2): qty 1

## 2019-11-21 MED ORDER — MULTIVITAMIN ADULT PO TABS: 1.0000 | ORAL_TABLET | Freq: Every day | ORAL | Status: DC

## 2019-11-21 MED ORDER — SODIUM CHLORIDE 0.9 % IV SOLN: INTRAVENOUS | Status: DC

## 2019-11-21 MED ORDER — IOHEXOL 350 MG/ML SOLN
100.0000 mL | Freq: Once | INTRAVENOUS | Status: AC | PRN
  Administered 2019-11-21: 15:00:00 100 mL via INTRAVENOUS

## 2019-11-21 MED ORDER — ADULT MULTIVITAMIN W/MINERALS CH
1.0000 | ORAL_TABLET | Freq: Every day | ORAL | Status: DC
  Administered 2019-11-22 – 2019-11-23 (×2): 1 via ORAL
  Filled 2019-11-21 (×2): qty 1

## 2019-11-21 MED ORDER — SODIUM CHLORIDE 0.9 % IV BOLUS
1000.0000 mL | Freq: Once | INTRAVENOUS | Status: AC
  Administered 2019-11-21: 16:00:00 1000 mL via INTRAVENOUS

## 2019-11-21 MED ORDER — SODIUM CHLORIDE (PF) 0.9 % IJ SOLN
INTRAMUSCULAR | Status: AC
  Filled 2019-11-21: qty 50

## 2019-11-21 MED ORDER — SODIUM CHLORIDE 0.9 % IV BOLUS
1000.0000 mL | Freq: Once | INTRAVENOUS | Status: AC
  Administered 2019-11-21: 12:00:00 1000 mL via INTRAVENOUS

## 2019-11-21 MED ORDER — THYROID 60 MG PO TABS
60.0000 mg | ORAL_TABLET | Freq: Every day | ORAL | Status: DC
  Administered 2019-11-22 – 2019-11-23 (×2): 60 mg via ORAL
  Filled 2019-11-21 (×2): qty 1

## 2019-11-21 MED ORDER — ENOXAPARIN SODIUM 40 MG/0.4ML ~~LOC~~ SOLN
40.0000 mg | SUBCUTANEOUS | Status: DC
  Administered 2019-11-21 – 2019-11-22 (×2): 40 mg via SUBCUTANEOUS
  Filled 2019-11-21 (×2): qty 0.4

## 2019-11-21 MED ORDER — SIMVASTATIN 40 MG PO TABS
40.0000 mg | ORAL_TABLET | Freq: Every day | ORAL | Status: DC
  Administered 2019-11-22 – 2019-11-23 (×2): 40 mg via ORAL
  Filled 2019-11-21 (×2): qty 1

## 2019-11-21 NOTE — H&P (Signed)
History and Physical    Allison [name] [id number] DOB: 1940-11-29 DOA: 11/21/2019  PCP: Denita Lung, MD   Patient coming from: Home  Chief Complaint: Weakness, fall  HPI: Allison [name] is a 79 y.o. female with medical history significant of hypothyroidism, hypertension, recently diagnosed with Covid on 1/10 presents from home with complaints of severe weakness, loss of appetite, multiple falls. Patient was diagnosed with Covid on 1/10 and since then she has continued to feel very weak, fatigued, has poor oral intake.  She has fallen multiple times at home.  She was feeling lightheadedness.  She is usually physically active and ambulates without any problem.  She also got Covid vaccine on 1/19 despite being diagnosed with Covid about 9 days ago.  She lives with her husband and her husband was tested negative. Patient seen and examined at the bedside in the emergency department.  She denies any chest pain, shortness of breath, cough, abdominal pain, nausea, vomiting or diarrhea.  Her only complaint was fatigue.  ED Course: Hemodynamically stable.  Chest imagings suggestive of interstitial opacities on bilateral bases but her lungs were clear on auscultation.  D-dimer was mildly elevated.  Chest angiogram negative for PE.  She has mild elevated troponin.  Has mild AKI. Since she is not hypoxic, and her positive test was about 3 weeks ago, she was not kept on isolation.  She was found to be orthostatic and started on IV fluids.  Review of Systems: As per HPI otherwise 10 point review of systems negative.    Past Medical History:  Diagnosis Date  . Arthritis    lower back  . Former smoker   . GERD (gastroesophageal reflux disease)   . History of appendicitis   . History of blood transfusion 1960   after delivery of son  . History of cataract   . History of kidney stones 1999   lithrotripsy  . Hypercholesterolemia   . Hypertension   . Hypothyroid   . Iron deficiency anemia     . Macular degeneration    bilateral  . Pinched nerve    back  . Seasonal allergies    RHINITIS  . Tachycardia   . Wears glasses    reading    Past Surgical History:  Procedure Laterality Date  . ABDOMINAL HYSTERECTOMY    . APPENDECTOMY    . cataract surgery Bilateral 03/2014   polyps  . COLONOSCOPY  02/2010  . CYSTOSCOPY W/ URETERAL STENT PLACEMENT Right 09/07/2019   Procedure: CYSTOSCOPY WITH STENT REPLACEMENT WITH RETROGRADES;  Surgeon: Alexis Frock, MD;  Location: [facility];  Service: Urology;  Laterality: Right;  . CYSTOSCOPY/RETROGRADE/URETEROSCOPY Bilateral 07/18/2019   Procedure: CYSTOSCOPY/RETROGRADE/RIGHT URETEROSCOPY/BLADDER BIOPSY/STENT PLACEMENT/RIGHT URETERAL MASS BIOPSY;  Surgeon: Alexis Frock, MD;  Location: [facility];  Service: Urology;  Laterality: Bilateral;  1 HR     reports that she quit smoking about 26 years ago. Her smoking use included cigarettes. She has a 20.00 pack-year smoking history. She has never used smokeless tobacco. She reports current alcohol use of about 5.0 standard drinks of alcohol per week. She reports that she does not use drugs.  Allergies  Allergen Reactions  . Niacin Rash    Family History  Problem Relation Age of Onset  . Arthritis Mother   . Diabetes Mother   . Arthritis Father   . Diabetes Father   . Hyperlipidemia Father   . Colon cancer Brother 25  . Colon polyps Brother 14  . Cancer  Paternal Grandmother   . Heart failure Sister   . Hyperlipidemia Sister   . Rectal cancer Neg Hx   . Stomach cancer Neg Hx   . Esophageal cancer Neg Hx      Prior to Admission medications   Medication Sig Start Date End Date Taking? Authorizing Provider  losartan-hydrochlorothiazide (HYZAAR) 100-25 MG tablet Take 1 tablet by mouth daily. 08/01/19  Yes Denita Lung, MD  Multiple Vitamin (MULTIVITAMIN ADULT) TABS Take 1 tablet by mouth daily.   Yes [provider]  omeprazole (PRILOSEC) 20 MG capsule Take 1  capsule (20 mg total) by mouth daily. 06/01/19  Yes Denita Lung, MD  simvastatin (ZOCOR) 40 MG tablet Take 1 tablet (40 mg total) by mouth daily. 08/01/19  Yes Denita Lung, MD  thyroid [facility] THYROID) 60 MG tablet Take 1 tablet (60 mg total) by mouth daily before breakfast. 10/30/19  Yes Denita Lung, MD    Physical Exam: Vitals:   11/21/19 1530 11/21/19 1547 11/21/19 1600 11/21/19 1730  BP: (!) 107/42  117/69 (!) 122/50  Pulse: 91 67 67 (!) 58  Resp: 14 13 17 13   Temp:      TempSrc:      SpO2: 95% 100% 100% 99%    Constitutional: Generalized weakness, pleasant elderly female Vitals:   11/21/19 1530 11/21/19 1547 11/21/19 1600 11/21/19 1730  BP: (!) 107/42  117/69 (!) 122/50  Pulse: 91 67 67 (!) 58  Resp: 14 13 17 13   Temp:      TempSrc:      SpO2: 95% 100% 100% 99%   Neck: normal, supple, no masses, no thyromegaly Respiratory: clear to auscultation bilaterally, no wheezing, no crackles. Normal respiratory effort. No accessory muscle use.  Cardiovascular: Regular rate and rhythm, no murmurs / rubs / gallops. No extremity edema. 2+ pedal pulses.  Abdomen: no tenderness, no masses palpated. No hepatosplenomegaly. Bowel sounds positive.  Musculoskeletal: no clubbing / cyanosis. No joint deformity upper and lower extremities. Good ROM, no contractures. Normal muscle tone.  Skin: no rashes, lesions, ulcers. No induration.  Scattered ecchymosis Neurologic: CN 2-12 grossly intact. Sensation intact, Strength 5/5 in all 4.  Psychiatric: Normal judgment and insight. Alert and oriented x 3. Normal mood.   Foley Catheter:None  Labs on Admission: I have personally reviewed following labs and imaging studies  CBC: Recent Labs  Lab 11/21/19 1147  WBC 11.7*  NEUTROABS 9.8*  HGB 14.5  HCT 44.4  MCV 94.1  PLT PLATELET CLUMPS NOTED ON SMEAR, UNABLE TO ESTIMATE   Basic Metabolic Panel: Recent Labs  Lab 11/21/19 1147  NA 140  K 3.5  CL 103  CO2 21*  GLUCOSE 145*  BUN 42*    CREATININE 1.41*  CALCIUM 9.5   GFR: CrCl cannot be calculated (Unknown ideal weight.). Liver Function Tests: Recent Labs  Lab 11/21/19 1147  AST 32  ALT 18  ALKPHOS 48  BILITOT 1.2  PROT 7.4  ALBUMIN 3.5   No results for input(s): LIPASE, AMYLASE in the last 168 hours. No results for input(s): AMMONIA in the last 168 hours. Coagulation Profile: No results for input(s): INR, PROTIME in the last 168 hours. Cardiac Enzymes: Recent Labs  Lab 11/21/19 1147  CKTOTAL 66   BNP (last 3 results) No results for input(s): PROBNP in the last 8760 hours. HbA1C: No results for input(s): HGBA1C in the last 72 hours. CBG: Recent Labs  Lab 11/21/19 1116  GLUCAP 120*   Lipid Profile: No results  for input(s): CHOL, HDL, LDLCALC, TRIG, CHOLHDL, LDLDIRECT in the last 72 hours. Thyroid Function Tests: No results for input(s): TSH, T4TOTAL, FREET4, T3FREE, THYROIDAB in the last 72 hours. Anemia Panel: No results for input(s): VITAMINB12, FOLATE, FERRITIN, TIBC, IRON, RETICCTPCT in the last 72 hours. Urine analysis:    Component Value Date/Time   LABSPEC 1.015 05/31/2019 1059   BILIRUBINUR negative 05/31/2019 1059   BILIRUBINUR n 10/29/2016 0927   KETONESUR negative 05/31/2019 1059   PROTEINUR =30 (A) 05/31/2019 1059   PROTEINUR n 10/29/2016 0927   UROBILINOGEN negative 10/29/2016 0927   NITRITE Negative 05/31/2019 1059   NITRITE n 10/29/2016 0927   LEUKOCYTESUR Small (1+) (A) 05/31/2019 1059    Radiological Exams on Admission: CT Head Wo Contrast  Result Date: 11/21/2019 CLINICAL DATA:  [place] yesterday. Bruising around the right eye and right cheek. COVID-19 positive 2 weeks ago. EXAM: CT HEAD WITHOUT CONTRAST CT MAXILLOFACIAL WITHOUT CONTRAST TECHNIQUE: Multidetector CT imaging of the head and maxillofacial structures were performed using the standard protocol without intravenous contrast. Multiplanar CT image reconstructions of the maxillofacial structures were also generated.  COMPARISON:  None. FINDINGS: CT HEAD FINDINGS Brain: No evidence of acute infarction, hemorrhage, hydrocephalus, extra-axial collection or mass lesion/mass effect. Mild generalized cerebral atrophy. Scattered periventricular and subcortical white matter hypodensities are nonspecific, but favored to reflect chronic microvascular ischemic changes. Vascular: Atherosclerotic vascular calcification of the carotid siphons. No hyperdense vessel. Skull: Normal. Negative for fracture or focal lesion. Other: None. CT MAXILLOFACIAL FINDINGS Osseous: No fracture or mandibular dislocation. No destructive process. There are a few left maxillary periapical lucencies. Severe degenerative disc disease at C5-C6 with at least mild spinal canal stenosis. Orbits: Negative. No traumatic or inflammatory finding. Sinuses: Clear. Soft tissues: Mild right premalar soft tissue swelling. IMPRESSION: 1. No acute intracranial abnormality. 2. No acute maxillofacial fracture. Mild right premalar soft tissue swelling. Electronically Signed   By: Titus Dubin M.D.   On: 11/21/2019 12:50   CT Angio Chest PE W and/or Wo Contrast  Result Date: 11/21/2019 CLINICAL DATA:  Weakness and dizziness. EXAM: CT ANGIOGRAPHY CHEST WITH CONTRAST TECHNIQUE: Multidetector CT imaging of the chest was performed using the standard protocol during bolus administration of intravenous contrast. Multiplanar CT image reconstructions and MIPs were obtained to evaluate the vascular anatomy. CONTRAST:  185mL OMNIPAQUE IOHEXOL 350 MG/ML SOLN COMPARISON:  April 08, 2006 FINDINGS: Cardiovascular: There is marked severity calcification of the thoracic aorta satisfactory opacification of the pulmonary arteries to the segmental level. No evidence of pulmonary embolism. Normal heart size. No pericardial effusion. Marked severity coronary artery calcification is seen Mediastinum/Nodes: No enlarged mediastinal, hilar, or axillary lymph nodes. Lungs/Pleura: Very mild, hazy  atelectasis and/or infiltrate is seen along the periphery of the bilateral lower lobes. There is no evidence of a pleural effusion or pneumothorax. Upper Abdomen: No acute abnormality. Musculoskeletal: Bilateral breast implants are seen. Multilevel degenerative changes are noted throughout the thoracic spine. Review of the MIP images confirms the above findings. IMPRESSION: 1. No CT evidence of acute pulmonary embolism. 2. Very mild hazy atelectasis and/or infiltrate along the periphery of the bilateral lower lobes. 3. Marked severity coronary artery calcification. Electronically Signed   By: Virgina Norfolk M.D.   On: 11/21/2019 15:39   DG Chest Portable 1 View  Result Date: 11/21/2019 CLINICAL DATA:  Dyspnea, COVID-19 positive EXAM: PORTABLE CHEST 1 VIEW COMPARISON:  06/07/2008 FINDINGS: Heart size within normal limits. Aortic atherosclerosis. Mild interstitial opacities within the bilateral lung bases, right slightly greater than  left. No pleural effusion or pneumothorax. IMPRESSION: Mild interstitial opacities within the bilateral lung bases, right slightly greater than left. Findings suspicious for developing atypical infection. Electronically Signed   By: Davina Poke D.O.   On: 11/21/2019 12:48   CT Maxillofacial Wo Contrast  Result Date: 11/21/2019 CLINICAL DATA:  [place] yesterday. Bruising around the right eye and right cheek. COVID-19 positive 2 weeks ago. EXAM: CT HEAD WITHOUT CONTRAST CT MAXILLOFACIAL WITHOUT CONTRAST TECHNIQUE: Multidetector CT imaging of the head and maxillofacial structures were performed using the standard protocol without intravenous contrast. Multiplanar CT image reconstructions of the maxillofacial structures were also generated. COMPARISON:  None. FINDINGS: CT HEAD FINDINGS Brain: No evidence of acute infarction, hemorrhage, hydrocephalus, extra-axial collection or mass lesion/mass effect. Mild generalized cerebral atrophy. Scattered periventricular and subcortical  white matter hypodensities are nonspecific, but favored to reflect chronic microvascular ischemic changes. Vascular: Atherosclerotic vascular calcification of the carotid siphons. No hyperdense vessel. Skull: Normal. Negative for fracture or focal lesion. Other: None. CT MAXILLOFACIAL FINDINGS Osseous: No fracture or mandibular dislocation. No destructive process. There are a few left maxillary periapical lucencies. Severe degenerative disc disease at C5-C6 with at least mild spinal canal stenosis. Orbits: Negative. No traumatic or inflammatory finding. Sinuses: Clear. Soft tissues: Mild right premalar soft tissue swelling. IMPRESSION: 1. No acute intracranial abnormality. 2. No acute maxillofacial fracture. Mild right premalar soft tissue swelling. Electronically Signed   By: Titus Dubin M.D.   On: 11/21/2019 12:50     Assessment/Plan Principal Problem:   Generalized weakness Active Problems:   HYPERCHOLESTEROLEMIA   Essential hypertension   GERD   Orthostatic hypotension    Generalized weakness: Post Covid weakness.  Has poor appetite, poor oral intake at home.  Also fell multiple times at home.  Usually ambulatory without any problem at baseline.  Will request for PT/OT evaluation.  Orthostatic hypotension: Most likely secondary to dehydration due to poor oral intake.  Antihypertensives have been held.  Continue IV fluids.  Check orthostatic vitals tomorrow.  History of recent Covid: Tested positive on 1/10.  Currently denies any shortness of breath or cough.  Lungs are clear on auscultation.  She got the first dose of covid  vaccine on 1/19 despite being tested positive just 9 days ago.  I do not think she should be on isolation even though her screening test comes out to be positive.  AKI on CKD stage II:: Baseline creatinine around 1.2.  Secondary to dehydration from poor oral intake. Continue IV fluids.  Monitor kidney function.  Also has mild elevated anion gap.  Elevated troponin:  Flat trend.  Denies any chest pain.  Most likely due to supply demand ischemia.  Will not pursue further work-up.  Hypertension: Home antihypertensives on hold.  Currently blood pressure stable but orthostatic.  Hypothyroidism: Continue Synthyroid.    Severity of Illness: The appropriate patient status for this patient is OBSERVATION.    DVT prophylaxis: Lovenox Code Status: Full Family Communication: Discussed with son on phone Consults called: None     Shelly Coss MD [facility] Pager [phone]  If 7PM-7AM, please contact night-coverage [email or web address] Password [facility]  11/21/2019, 5:54 PM

## 2019-11-21 NOTE — Telephone Encounter (Signed)
Pt. Son called Allison [name] is not doing well at all not eating, getting very weak, falling down due to being so weak had covid and recently had covid shot, she is laying down on the floor right now and her husband can not get her up, no energy, and no strenght per Beverlee Nims I suggested that she needed to go to the ER.

## 2019-11-21 NOTE — ED Notes (Signed)
Patient was tested positive on 11/04/2019 and is outside the window for precautions. Admitting MD is aware.

## 2019-11-21 NOTE — ED Notes (Signed)
ED TO INPATIENT HANDOFF REPORT  Name/Age/Gender Allison [name] 79 y.o. female  Code Status    Code Status Orders  (From admission, onward)         Start     Ordered   11/21/19 1743  Full code  Continuous     11/21/19 1742        Code Status History    Date Active Date Inactive Code Status Order ID Comments User Context   09/07/2019 1257 09/09/2019 1630 Full Code [id number]  Debbrah Alar, PA-C Inpatient   Advance Care Planning Activity      Home/SNF/Other Home  Chief Complaint Generalized weakness [R53.1]  Level of Care/Admitting Diagnosis ED Disposition    ED Disposition Condition [place]: [facility] [id number]  Level of Care: Telemetry [5]  Admit to tele based on following criteria: Complex arrhythmia (Bradycardia/Tachycardia)  Covid Evaluation: Asymptomatic Screening Protocol (No Symptoms)  Diagnosis: Generalized weakness [id number]  Admitting Physician: Shelly Coss [id number]  Attending Physician: Shelly Coss [id number]       Medical History Past Medical History:  Diagnosis Date  . Arthritis    lower back  . Former smoker   . GERD (gastroesophageal reflux disease)   . History of appendicitis   . History of blood transfusion 1960   after delivery of son  . History of cataract   . History of kidney stones 1999   lithrotripsy  . Hypercholesterolemia   . Hypertension   . Hypothyroid   . Iron deficiency anemia   . Macular degeneration    bilateral  . Pinched nerve    back  . Seasonal allergies    RHINITIS  . Tachycardia   . Wears glasses    reading    Allergies Allergies  Allergen Reactions  . Niacin Rash    IV Location/Drains/Wounds Patient Lines/Drains/Airways Status   Active Line/Drains/Airways    Name:   Placement date:   Placement time:   Site:   Days:   Peripheral IV 11/21/19 Right Antecubital   11/21/19    1150    Antecubital   less than 1   Peripheral IV 11/21/19 Left Arm    11/21/19    1431    Arm   less than 1   Closed System Drain 1 LLQ Bulb (JP) 15 Fr.   09/07/19    1052    LLQ   75   Urethral Catheter placed in OR   09/07/19    -    -   75   Ureteral Drain/Stent Right ureter 6 Fr.   09/07/19    0808    Right ureter   75   Incision (Closed) 07/18/19 Perineum   07/18/19    1149     126   Incision - 6 Ports Abdomen 1: Left;Lateral;Lower 2: Left;Lateral;Upper 3: Umbilicus 4: Right;Lateral;Lower 5: Right;Medial;Upper 6: Right;Lateral;Upper   09/07/19    0820     75          Labs/Imaging Results for orders placed or performed during the hospital encounter of 11/21/19 (from the past 48 hour(s))  CBG monitoring, ED     Status: Abnormal   Collection Time: 11/21/19 11:16 AM  Result Value Ref Range   Glucose-Capillary 120 (H) 70 - 99 mg/dL  Comprehensive metabolic panel     Status: Abnormal   Collection Time: 11/21/19 11:47 AM  Result Value Ref Range   Sodium 140 135 - 145 mmol/L   Potassium  3.5 3.5 - 5.1 mmol/L   Chloride 103 98 - 111 mmol/L   CO2 21 (L) 22 - 32 mmol/L   Glucose, Bld 145 (H) 70 - 99 mg/dL   BUN 42 (H) 8 - 23 mg/dL   Creatinine, Ser 1.41 (H) 0.44 - 1.00 mg/dL   Calcium 9.5 8.9 - 10.3 mg/dL   Total Protein 7.4 6.5 - 8.1 g/dL   Albumin 3.5 3.5 - 5.0 g/dL   AST 32 15 - 41 U/L   ALT 18 0 - 44 U/L   Alkaline Phosphatase 48 38 - 126 U/L   Total Bilirubin 1.2 0.3 - 1.2 mg/dL   GFR calc non Af Amer 35 (L) >60 mL/min   GFR calc Af Amer 41 (L) >60 mL/min   Anion gap 16 (H) 5 - 15    Comment: Performed at [facility], [place] [address] [place], [place] [address]  Troponin I (High Sensitivity)     Status: Abnormal   Collection Time: 11/21/19 11:47 AM  Result Value Ref Range   Troponin I (High Sensitivity) 40 (H) <18 ng/L    Comment: (NOTE) Elevated high sensitivity troponin I (hsTnI) values and significant  changes across serial measurements may suggest ACS but many other  chronic and acute conditions are known to elevate  hsTnI results.  Refer to the "Links" section for chest pain algorithms and additional  guidance. Performed at [facility], [place] [address], [place], [place] [address]   CBC with Differential     Status: Abnormal   Collection Time: 11/21/19 11:47 AM  Result Value Ref Range   WBC 11.7 (H) 4.0 - 10.5 K/uL   RBC 4.72 3.87 - 5.11 MIL/uL   Hemoglobin 14.5 12.0 - 15.0 g/dL   HCT 44.4 36.0 - 46.0 %   MCV 94.1 80.0 - 100.0 fL   MCH 30.7 26.0 - 34.0 pg   MCHC 32.7 30.0 - 36.0 g/dL   RDW 13.8 11.5 - 15.5 %   Platelets PLATELET CLUMPS NOTED ON SMEAR, UNABLE TO ESTIMATE 150 - 400 K/uL   nRBC 0.0 0.0 - 0.2 %   Neutrophils Relative % 83 %   Neutro Abs 9.8 (H) 1.7 - 7.7 K/uL   Lymphocytes Relative 8 %   Lymphs Abs 1.0 0.7 - 4.0 K/uL   Monocytes Relative 7 %   Monocytes Absolute 0.8 0.1 - 1.0 K/uL   Eosinophils Relative 1 %   Eosinophils Absolute 0.1 0.0 - 0.5 K/uL   Basophils Relative 0 %   Basophils Absolute 0.0 0.0 - 0.1 K/uL   Immature Granulocytes 1 %   Abs Immature Granulocytes 0.07 0.00 - 0.07 K/uL    Comment: Performed at [facility], [place] [address], [place], [place] [address]  D-dimer, quantitative     Status: Abnormal   Collection Time: 11/21/19 11:47 AM  Result Value Ref Range   D-Dimer, Quant 1.75 (H) 0.00 - 0.50 ug/mL-FEU    Comment: (NOTE) At the manufacturer cut-off of 0.50 ug/mL FEU, this assay has been documented to exclude PE with a sensitivity and negative predictive value of 97 to 99%.  At this time, this assay has not been approved by the FDA to exclude DVT/VTE. Results should be correlated with clinical presentation. Performed at [facility], [place] [address] [place], [place] [address]   CK     Status: None   Collection Time: 11/21/19 11:47 AM  Result Value Ref Range   Total CK 66  38 - 234 U/L    Comment: Performed at [facility], [place] [address] [place], [place] [address]  Troponin I  (High Sensitivity)     Status: Abnormal   Collection Time: 11/21/19  2:19 PM  Result Value Ref Range   Troponin I (High Sensitivity) 41 (H) <18 ng/L    Comment: (NOTE) Elevated high sensitivity troponin I (hsTnI) values and significant  changes across serial measurements may suggest ACS but many other  chronic and acute conditions are known to elevate hsTnI results.  Refer to the "Links" section for chest pain algorithms and additional  guidance. Performed at [facility], [place] [address] [place], [place] [address]   Respiratory Panel by RT PCR (Flu A&B, Covid) - Nasopharyngeal Swab     Status: Abnormal   Collection Time: 11/21/19  4:51 PM   Specimen: Nasopharyngeal Swab  Result Value Ref Range   SARS Coronavirus 2 by RT PCR POSITIVE (A) NEGATIVE    Comment: RESULT CALLED TO, READ BACK BY AND VERIFIED WITH: BINGHAM,S. RN @1822  ON 01.27.2021 BY COHEN,K (NOTE) SARS-CoV-2 target nucleic acids are DETECTED. SARS-CoV-2 RNA is generally detectable in upper respiratory specimens  during the acute phase of infection. Positive results are indicative of the presence of the identified virus, but do not rule out bacterial infection or co-infection with other pathogens not detected by the test. Clinical correlation with patient history and other diagnostic information is necessary to determine patient infection status. The expected result is Negative. Fact Sheet for Patients:  PinkCheek.be Fact Sheet for Healthcare Providers: GravelBags.it This test is not yet approved or cleared by the [place] FDA and  has been authorized for detection and/or diagnosis of SARS-CoV-2 by FDA under an Emergency Use Authorization (EUA).  This EUA will remain in effect (meaning this test can b e used) for the duration of  the COVID-19 declaration under Section 564(b)(1) of the Act, 21 U.S.C. section 360bbb-3(b)(1), unless the  authorization is terminated or revoked sooner.    Influenza A by PCR NEGATIVE NEGATIVE   Influenza B by PCR NEGATIVE NEGATIVE    Comment: (NOTE) The Xpert Xpress SARS-CoV-2/FLU/RSV assay is intended as an aid in  the diagnosis of influenza from Nasopharyngeal swab specimens and  should not be used as a sole basis for treatment. Nasal washings and  aspirates are unacceptable for Xpert Xpress SARS-CoV-2/FLU/RSV  testing. Fact Sheet for Patients: PinkCheek.be Fact Sheet for Healthcare Providers: GravelBags.it This test is not yet approved or cleared by the [place] FDA and  has been authorized for detection and/or diagnosis of SARS-CoV-2 by  FDA under an Emergency Use Authorization (EUA). This EUA will remain  in effect (meaning this test can be used) for the duration of the  Covid-19 declaration under Section 564(b)(1) of the Act, 21  U.S.C. section 360bbb-3(b)(1), unless the authorization is  terminated or revoked. Performed at [facility], [place] [address] [place], [place] [address]   Urinalysis, Routine w reflex microscopic     Status: Abnormal   Collection Time: 11/21/19  6:18 PM  Result Value Ref Range   Color, Urine YELLOW YELLOW   APPearance CLOUDY (A) CLEAR   Specific Gravity, Urine 1.026 1.005 - 1.030   pH 5.0 5.0 - 8.0   Glucose, UA NEGATIVE NEGATIVE mg/dL   Hgb urine dipstick LARGE (A) NEGATIVE   Bilirubin Urine NEGATIVE NEGATIVE   Ketones, ur 5 (A) NEGATIVE mg/dL   Protein, ur 100 (A) NEGATIVE mg/dL  Nitrite NEGATIVE NEGATIVE   Leukocytes,Ua LARGE (A) NEGATIVE   RBC / HPF >50 (H) 0 - 5 RBC/hpf   WBC, UA >50 (H) 0 - 5 WBC/hpf   Bacteria, UA MANY (A) NONE SEEN   Squamous Epithelial / LPF 0-5 0 - 5   WBC Clumps PRESENT    Non Squamous Epithelial 0-5 (A) NONE SEEN   Crystals PRESENT (A) NEGATIVE    Comment: Performed at [facility], [place] [address],  [place], [place] [address]   CT Head Wo Contrast  Result Date: 11/21/2019 CLINICAL DATA:  [place] yesterday. Bruising around the right eye and right cheek. COVID-19 positive 2 weeks ago. EXAM: CT HEAD WITHOUT CONTRAST CT MAXILLOFACIAL WITHOUT CONTRAST TECHNIQUE: Multidetector CT imaging of the head and maxillofacial structures were performed using the standard protocol without intravenous contrast. Multiplanar CT image reconstructions of the maxillofacial structures were also generated. COMPARISON:  None. FINDINGS: CT HEAD FINDINGS Brain: No evidence of acute infarction, hemorrhage, hydrocephalus, extra-axial collection or mass lesion/mass effect. Mild generalized cerebral atrophy. Scattered periventricular and subcortical white matter hypodensities are nonspecific, but favored to reflect chronic microvascular ischemic changes. Vascular: Atherosclerotic vascular calcification of the carotid siphons. No hyperdense vessel. Skull: Normal. Negative for fracture or focal lesion. Other: None. CT MAXILLOFACIAL FINDINGS Osseous: No fracture or mandibular dislocation. No destructive process. There are a few left maxillary periapical lucencies. Severe degenerative disc disease at C5-C6 with at least mild spinal canal stenosis. Orbits: Negative. No traumatic or inflammatory finding. Sinuses: Clear. Soft tissues: Mild right premalar soft tissue swelling. IMPRESSION: 1. No acute intracranial abnormality. 2. No acute maxillofacial fracture. Mild right premalar soft tissue swelling. Electronically Signed   By: Titus Dubin M.D.   On: 11/21/2019 12:50   CT Angio Chest PE W and/or Wo Contrast  Result Date: 11/21/2019 CLINICAL DATA:  Weakness and dizziness. EXAM: CT ANGIOGRAPHY CHEST WITH CONTRAST TECHNIQUE: Multidetector CT imaging of the chest was performed using the standard protocol during bolus administration of intravenous contrast. Multiplanar CT image reconstructions and MIPs were obtained to evaluate the vascular anatomy.  CONTRAST:  138mL OMNIPAQUE IOHEXOL 350 MG/ML SOLN COMPARISON:  April 08, 2006 FINDINGS: Cardiovascular: There is marked severity calcification of the thoracic aorta satisfactory opacification of the pulmonary arteries to the segmental level. No evidence of pulmonary embolism. Normal heart size. No pericardial effusion. Marked severity coronary artery calcification is seen Mediastinum/Nodes: No enlarged mediastinal, hilar, or axillary lymph nodes. Lungs/Pleura: Very mild, hazy atelectasis and/or infiltrate is seen along the periphery of the bilateral lower lobes. There is no evidence of a pleural effusion or pneumothorax. Upper Abdomen: No acute abnormality. Musculoskeletal: Bilateral breast implants are seen. Multilevel degenerative changes are noted throughout the thoracic spine. Review of the MIP images confirms the above findings. IMPRESSION: 1. No CT evidence of acute pulmonary embolism. 2. Very mild hazy atelectasis and/or infiltrate along the periphery of the bilateral lower lobes. 3. Marked severity coronary artery calcification. Electronically Signed   By: Virgina Norfolk M.D.   On: 11/21/2019 15:39   DG Chest Portable 1 View  Result Date: 11/21/2019 CLINICAL DATA:  Dyspnea, COVID-19 positive EXAM: PORTABLE CHEST 1 VIEW COMPARISON:  06/07/2008 FINDINGS: Heart size within normal limits. Aortic atherosclerosis. Mild interstitial opacities within the bilateral lung bases, right slightly greater than left. No pleural effusion or pneumothorax. IMPRESSION: Mild interstitial opacities within the bilateral lung bases, right slightly greater than left. Findings suspicious for developing atypical infection. Electronically Signed   By: Davina Poke D.O.  On: 11/21/2019 12:48   CT Maxillofacial Wo Contrast  Result Date: 11/21/2019 CLINICAL DATA:  [place] yesterday. Bruising around the right eye and right cheek. COVID-19 positive 2 weeks ago. EXAM: CT HEAD WITHOUT CONTRAST CT MAXILLOFACIAL WITHOUT CONTRAST  TECHNIQUE: Multidetector CT imaging of the head and maxillofacial structures were performed using the standard protocol without intravenous contrast. Multiplanar CT image reconstructions of the maxillofacial structures were also generated. COMPARISON:  None. FINDINGS: CT HEAD FINDINGS Brain: No evidence of acute infarction, hemorrhage, hydrocephalus, extra-axial collection or mass lesion/mass effect. Mild generalized cerebral atrophy. Scattered periventricular and subcortical white matter hypodensities are nonspecific, but favored to reflect chronic microvascular ischemic changes. Vascular: Atherosclerotic vascular calcification of the carotid siphons. No hyperdense vessel. Skull: Normal. Negative for fracture or focal lesion. Other: None. CT MAXILLOFACIAL FINDINGS Osseous: No fracture or mandibular dislocation. No destructive process. There are a few left maxillary periapical lucencies. Severe degenerative disc disease at C5-C6 with at least mild spinal canal stenosis. Orbits: Negative. No traumatic or inflammatory finding. Sinuses: Clear. Soft tissues: Mild right premalar soft tissue swelling. IMPRESSION: 1. No acute intracranial abnormality. 2. No acute maxillofacial fracture. Mild right premalar soft tissue swelling. Electronically Signed   By: Titus Dubin M.D.   On: 11/21/2019 12:50    Pending Labs Unresulted Labs (From admission, onward)    Start     Ordered   11/22/19 [id number]  Basic metabolic panel  Tomorrow morning,   R     11/21/19 1742   11/22/19 0500  CBC  Tomorrow morning,   R     11/21/19 1742          Vitals/Pain Today's Vitals   11/21/19 1547 11/21/19 1600 11/21/19 1730 11/21/19 1830  BP:  117/69 (!) 122/50 (!) 126/57  Pulse: 67 67 (!) 58 66  Resp: 13 17 13 15   Temp:      TempSrc:      SpO2: 100% 100% 99% 98%  PainSc:        Isolation Precautions No active isolations  Medications Medications  sodium chloride (PF) 0.9 % injection (has no administration in time range)   thyroid (ARMOUR) tablet 60 mg (has no administration in time range)  pantoprazole (PROTONIX) EC tablet 40 mg (has no administration in time range)  Multivitamin Adult TABS 1 tablet (has no administration in time range)  simvastatin (ZOCOR) tablet 40 mg (has no administration in time range)  enoxaparin (LOVENOX) injection 40 mg (has no administration in time range)  0.9 %  sodium chloride infusion (has no administration in time range)  sodium chloride 0.9 % bolus 1,000 mL (0 mLs Intravenous Stopped 11/21/19 1421)  sodium chloride 0.9 % bolus 1,000 mL (1,000 mLs Intravenous New Bag/Given 11/21/19 1620)  iohexol (OMNIPAQUE) 350 MG/ML injection 100 mL (100 mLs Intravenous Contrast Given 11/21/19 1513)    Mobility walks

## 2019-11-21 NOTE — ED Triage Notes (Signed)
Per EMS-tested positive for covid on 1/10-states she has'nt had an appetite, states feeling weak-dizzy when she stands-increased falls, fell yesterday-possible dehydration

## 2019-11-21 NOTE — ED Notes (Signed)
[place] (son)

## 2019-11-21 NOTE — ED Provider Notes (Signed)
[facility] DEPT Provider Note   CSN: [id number] Arrival date & time: 11/21/19  1041     History Chief Complaint  Patient presents with  . Weakness    Allison [name] is a 79 y.o. female.  HPI 79 year old female presents with lightheadedness, syncope and generalized weakness.  For about a week she has been feeling lightheaded whenever she stands and walks.  She had a positive Covid test on 1/10 at [facility].  States she seems to have gotten over those symptoms for the most part as there is no further cough or fever.  However the shortness of breath never fully went away.  The lightheadedness is new over the last week or so.  She has very little appetite though she is trying to use Ensure and yogurts.  Has fallen a couple times due to this lightheadedness.  She states it feels like she is going to pass out and not like she is off balance necessarily.  There is no headache.  Today she injured her face when she fell.  When she was try to get off the bed she felt lightheaded and passed out.  She could not get up so her husband tried to help her up but could not get her up either.  She laid on the floor for a couple hours.  She denies fever, vomiting, chest pain, abdominal pain, diarrhea or urinary symptoms.  No focal weakness or vision changes.   Past Medical History:  Diagnosis Date  . Arthritis    lower back  . Former smoker   . GERD (gastroesophageal reflux disease)   . History of appendicitis   . History of blood transfusion 1960   after delivery of son  . History of cataract   . History of kidney stones 1999   lithrotripsy  . Hypercholesterolemia   . Hypertension   . Hypothyroid   . Iron deficiency anemia   . Macular degeneration    bilateral  . Pinched nerve    back  . Seasonal allergies    RHINITIS  . Tachycardia   . Wears glasses    reading    Patient Active Problem List   Diagnosis Date Noted  . Ureteral stricture 09/07/2019  .  Osteopenia 08/28/2019  . Spinal stenosis of lumbar region with neurogenic claudication 03/11/2017  . History of kidney stones 12/04/2015  . Arthritis 12/04/2015  . Hypothyroidism 12/04/2015  . HYPERCHOLESTEROLEMIA 08/05/2009  . Essential hypertension 08/05/2009  . GERD 08/05/2009    Past Surgical History:  Procedure Laterality Date  . ABDOMINAL HYSTERECTOMY    . APPENDECTOMY    . cataract surgery Bilateral 03/2014   polyps  . COLONOSCOPY  02/2010  . CYSTOSCOPY W/ URETERAL STENT PLACEMENT Right 09/07/2019   Procedure: CYSTOSCOPY WITH STENT REPLACEMENT WITH RETROGRADES;  Surgeon: Alexis Frock, MD;  Location: [facility];  Service: Urology;  Laterality: Right;  . CYSTOSCOPY/RETROGRADE/URETEROSCOPY Bilateral 07/18/2019   Procedure: CYSTOSCOPY/RETROGRADE/RIGHT URETEROSCOPY/BLADDER BIOPSY/STENT PLACEMENT/RIGHT URETERAL MASS BIOPSY;  Surgeon: Alexis Frock, MD;  Location[facility];  Service: Urology;  Laterality: Bilateral;  1 HR     OB History   No obstetric history on file.     Family History  Problem Relation Age of Onset  . Arthritis Mother   . Diabetes Mother   . Arthritis Father   . Diabetes Father   . Hyperlipidemia Father   . Colon cancer Brother 77  . Colon polyps Brother 52  . Cancer Paternal Grandmother   .  Heart failure Sister   . Hyperlipidemia Sister   . Rectal cancer Neg Hx   . Stomach cancer Neg Hx   . Esophageal cancer Neg Hx     Social History   Tobacco Use  . Smoking status: Former Smoker    Packs/day: 1.00    Years: 20.00    Pack years: 20.00    Types: Cigarettes    Quit date: 10/24/1993    Years since quitting: 26.0  . Smokeless tobacco: Never Used  Substance Use Topics  . Alcohol use: Yes    Alcohol/week: 5.0 standard drinks    Types: 5 Glasses of wine per week    Comment: 4-5 glasses of wine per week.   . Drug use: No    Home Medications Prior to Admission medications   Medication Sig Start Date End Date Taking?  Authorizing Provider  dexamethasone (DECADRON) 4 MG tablet  11/05/19   [provider]  levothyroxine (SYNTHROID) 75 MCG tablet Take 1 tablet (75 mcg total) by mouth daily. 08/01/19   Denita Lung, MD  losartan-hydrochlorothiazide (HYZAAR) 100-25 MG tablet Take 1 tablet by mouth daily. 08/01/19   Denita Lung, MD  omeprazole (PRILOSEC) 20 MG capsule Take 1 capsule (20 mg total) by mouth daily. 06/01/19   Denita Lung, MD  oxybutynin (DITROPAN-XL) 5 MG 24 hr tablet TK 1 T PO QD 06/13/19   [provider]  simvastatin (ZOCOR) 40 MG tablet Take 1 tablet (40 mg total) by mouth daily. 08/01/19   Denita Lung, MD  thyroid [facility] THYROID) 60 MG tablet Take 1 tablet (60 mg total) by mouth daily before breakfast. 10/30/19   Denita Lung, MD  traMADol (ULTRAM) 50 MG tablet Take 1-2 tablets (50-100 mg total) by mouth every 6 (six) hours as needed for moderate pain or severe pain. Post-operatively 09/07/19 09/06/20  Debbrah Alar, PA-C  traMADol (ULTRAM) 50 MG tablet Take 1 tablet (50 mg total) by mouth every 6 (six) hours as needed. Sent to [facility] 09/09/19 09/08/20  Franchot Gallo, MD    Allergies    Niacin  Review of Systems   Review of Systems  Constitutional: Positive for appetite change. Negative for fever.  Respiratory: Positive for shortness of breath. Negative for cough.   Cardiovascular: Negative for chest pain and palpitations.  Gastrointestinal: Negative for abdominal pain, diarrhea and vomiting.  Genitourinary: Negative for dysuria.  Musculoskeletal: Negative for neck pain.  Neurological: Positive for syncope and light-headedness. Negative for headaches.  All other systems reviewed and are negative.   Physical Exam Updated Vital Signs BP (!) 107/42   Pulse 67   Temp 98 F (36.7 C) (Oral)   Resp 13   SpO2 100%   Physical Exam Vitals and nursing note reviewed.  Constitutional:      Appearance: She is well-developed.  HENT:     Head:  Normocephalic.      Right Ear: External ear normal.     Left Ear: External ear normal.     Nose: Nose normal.     Mouth/Throat:     Mouth: Mucous membranes are dry.  Eyes:     General:        Right eye: No discharge.        Left eye: No discharge.     Extraocular Movements: Extraocular movements intact.     Pupils: Pupils are equal, round, and reactive to light.  Cardiovascular:     Rate and Rhythm: Regular rhythm. Tachycardia present.  Heart sounds: Normal heart sounds. No murmur.     Comments: HR~100 Pulmonary:     Effort: Pulmonary effort is normal.     Breath sounds: Normal breath sounds.  Abdominal:     General: There is no distension.     Palpations: Abdomen is soft.     Tenderness: There is no abdominal tenderness.  Musculoskeletal:     Cervical back: Neck supple. No tenderness.  Skin:    General: Skin is warm and dry.  Neurological:     Mental Status: She is alert.     Comments: CN 3-12 grossly intact. 5/5 strength in all 4 extremities. Grossly normal sensation. Normal finger to nose. Patient is able to ambulate. Feels lightheaded but does not appear ataxic  Psychiatric:        Mood and Affect: Mood is not anxious.     ED Results / Procedures / Treatments   Labs (all labs ordered are listed, but only abnormal results are displayed) Labs Reviewed  COMPREHENSIVE METABOLIC PANEL - Abnormal; Notable for the following components:      Result Value   CO2 21 (*)    Glucose, Bld 145 (*)    BUN 42 (*)    Creatinine, Ser 1.41 (*)    GFR calc non Af Amer 35 (*)    GFR calc Af Amer 41 (*)    Anion gap 16 (*)    All other components within normal limits  CBC WITH DIFFERENTIAL/PLATELET - Abnormal; Notable for the following components:   WBC 11.7 (*)    Neutro Abs 9.8 (*)    All other components within normal limits  D-DIMER, QUANTITATIVE (NOT AT [facility]) - Abnormal; Notable for the following components:   D-Dimer, Quant 1.75 (*)    All other components within normal  limits  CBG MONITORING, ED - Abnormal; Notable for the following components:   Glucose-Capillary 120 (*)    All other components within normal limits  TROPONIN I (HIGH SENSITIVITY) - Abnormal; Notable for the following components:   Troponin I (High Sensitivity) 40 (*)    All other components within normal limits  TROPONIN I (HIGH SENSITIVITY) - Abnormal; Notable for the following components:   Troponin I (High Sensitivity) 41 (*)    All other components within normal limits  RESPIRATORY PANEL BY RT PCR (FLU A&B, COVID)  CK  URINALYSIS, ROUTINE W REFLEX MICROSCOPIC    EKG EKG Interpretation  Date/Time:  Wednesday November 21 2019 15:58:49 EST Ventricular Rate:  69 PR Interval:    QRS Duration: 85 QT Interval:  408 QTC Calculation: 438 R Axis:   68 Text Interpretation: Sinus rhythm Borderline repolarization abnormality mild improvement, but still diffuse ST depressions Confirmed by Sherwood Gambler [phone]) on 11/21/2019 4:02:16 PM   Radiology CT Head Wo Contrast  Result Date: 11/21/2019 CLINICAL DATA:  [place] yesterday. Bruising around the right eye and right cheek. COVID-19 positive 2 weeks ago. EXAM: CT HEAD WITHOUT CONTRAST CT MAXILLOFACIAL WITHOUT CONTRAST TECHNIQUE: Multidetector CT imaging of the head and maxillofacial structures were performed using the standard protocol without intravenous contrast. Multiplanar CT image reconstructions of the maxillofacial structures were also generated. COMPARISON:  None. FINDINGS: CT HEAD FINDINGS Brain: No evidence of acute infarction, hemorrhage, hydrocephalus, extra-axial collection or mass lesion/mass effect. Mild generalized cerebral atrophy. Scattered periventricular and subcortical white matter hypodensities are nonspecific, but favored to reflect chronic microvascular ischemic changes. Vascular: Atherosclerotic vascular calcification of the carotid siphons. No hyperdense vessel. Skull: Normal. Negative for fracture or focal  lesion. Other:  None. CT MAXILLOFACIAL FINDINGS Osseous: No fracture or mandibular dislocation. No destructive process. There are a few left maxillary periapical lucencies. Severe degenerative disc disease at C5-C6 with at least mild spinal canal stenosis. Orbits: Negative. No traumatic or inflammatory finding. Sinuses: Clear. Soft tissues: Mild right premalar soft tissue swelling. IMPRESSION: 1. No acute intracranial abnormality. 2. No acute maxillofacial fracture. Mild right premalar soft tissue swelling. Electronically Signed   By: Titus Dubin M.D.   On: 11/21/2019 12:50   CT Angio Chest PE W and/or Wo Contrast  Result Date: 11/21/2019 CLINICAL DATA:  Weakness and dizziness. EXAM: CT ANGIOGRAPHY CHEST WITH CONTRAST TECHNIQUE: Multidetector CT imaging of the chest was performed using the standard protocol during bolus administration of intravenous contrast. Multiplanar CT image reconstructions and MIPs were obtained to evaluate the vascular anatomy. CONTRAST:  146mL OMNIPAQUE IOHEXOL 350 MG/ML SOLN COMPARISON:  April 08, 2006 FINDINGS: Cardiovascular: There is marked severity calcification of the thoracic aorta satisfactory opacification of the pulmonary arteries to the segmental level. No evidence of pulmonary embolism. Normal heart size. No pericardial effusion. Marked severity coronary artery calcification is seen Mediastinum/Nodes: No enlarged mediastinal, hilar, or axillary lymph nodes. Lungs/Pleura: Very mild, hazy atelectasis and/or infiltrate is seen along the periphery of the bilateral lower lobes. There is no evidence of a pleural effusion or pneumothorax. Upper Abdomen: No acute abnormality. Musculoskeletal: Bilateral breast implants are seen. Multilevel degenerative changes are noted throughout the thoracic spine. Review of the MIP images confirms the above findings. IMPRESSION: 1. No CT evidence of acute pulmonary embolism. 2. Very mild hazy atelectasis and/or infiltrate along the periphery of the bilateral  lower lobes. 3. Marked severity coronary artery calcification. Electronically Signed   By: Virgina Norfolk M.D.   On: 11/21/2019 15:39   DG Chest Portable 1 View  Result Date: 11/21/2019 CLINICAL DATA:  Dyspnea, COVID-19 positive EXAM: PORTABLE CHEST 1 VIEW COMPARISON:  06/07/2008 FINDINGS: Heart size within normal limits. Aortic atherosclerosis. Mild interstitial opacities within the bilateral lung bases, right slightly greater than left. No pleural effusion or pneumothorax. IMPRESSION: Mild interstitial opacities within the bilateral lung bases, right slightly greater than left. Findings suspicious for developing atypical infection. Electronically Signed   By: Davina Poke D.O.   On: 11/21/2019 12:48   CT Maxillofacial Wo Contrast  Result Date: 11/21/2019 CLINICAL DATA:  [place] yesterday. Bruising around the right eye and right cheek. COVID-19 positive 2 weeks ago. EXAM: CT HEAD WITHOUT CONTRAST CT MAXILLOFACIAL WITHOUT CONTRAST TECHNIQUE: Multidetector CT imaging of the head and maxillofacial structures were performed using the standard protocol without intravenous contrast. Multiplanar CT image reconstructions of the maxillofacial structures were also generated. COMPARISON:  None. FINDINGS: CT HEAD FINDINGS Brain: No evidence of acute infarction, hemorrhage, hydrocephalus, extra-axial collection or mass lesion/mass effect. Mild generalized cerebral atrophy. Scattered periventricular and subcortical white matter hypodensities are nonspecific, but favored to reflect chronic microvascular ischemic changes. Vascular: Atherosclerotic vascular calcification of the carotid siphons. No hyperdense vessel. Skull: Normal. Negative for fracture or focal lesion. Other: None. CT MAXILLOFACIAL FINDINGS Osseous: No fracture or mandibular dislocation. No destructive process. There are a few left maxillary periapical lucencies. Severe degenerative disc disease at C5-C6 with at least mild spinal canal stenosis. Orbits:  Negative. No traumatic or inflammatory finding. Sinuses: Clear. Soft tissues: Mild right premalar soft tissue swelling. IMPRESSION: 1. No acute intracranial abnormality. 2. No acute maxillofacial fracture. Mild right premalar soft tissue swelling. Electronically Signed   By: Titus Dubin M.D.   On:  11/21/2019 12:50    Procedures Procedures (including critical care time)  Medications Ordered in ED Medications  sodium chloride (PF) 0.9 % injection (has no administration in time range)  sodium chloride 0.9 % bolus 1,000 mL (0 mLs Intravenous Stopped 11/21/19 1421)  sodium chloride 0.9 % bolus 1,000 mL (1,000 mLs Intravenous New Bag/Given 11/21/19 1620)  iohexol (OMNIPAQUE) 350 MG/ML injection 100 mL (100 mLs Intravenous Contrast Given 11/21/19 1513)    ED Course  I have reviewed the triage vital signs and the nursing notes.  Pertinent labs & imaging results that were available during my care of the patient were reviewed by me and considered in my medical decision making (see chart for details).    MDM Rules/Calculators/A&P                      Patient is profoundly orthostatic.  She is feeling somewhat better with fluids.  Mild dehydration noted.  Troponins are elevated but flat at 40 and 41.  She does have significant repolarization abnormality on ECG which is new compared to last year.  I think it would be reasonable to observe with further fluids and echo.  Ruled out for pulmonary embolus.  She states she had Covid 2 and half weeks ago.  Will retest per hospital policy after discussion with hospitalist (her positive test is not in our system).  Allison [name] was evaluated in Emergency Department on 11/21/2019 for the symptoms described in the history of present illness. She was evaluated in the context of the global COVID-19 pandemic, which necessitated consideration that the patient might be at risk for infection with the SARS-CoV-2 virus that causes COVID-19. Institutional protocols and  algorithms that pertain to the evaluation of patients at risk for COVID-19 are in a state of rapid change based on information released by regulatory bodies including the CDC and federal and state organizations. These policies and algorithms were followed during the patient's care in the ED.  Final Clinical Impression(s) / ED Diagnoses Final diagnoses:  Syncope and collapse    Rx / DC Orders ED Discharge Orders    None       Sherwood Gambler, MD 11/21/19 1650

## 2019-11-21 NOTE — Plan of Care (Signed)

## 2019-11-22 DIAGNOSIS — R296 Repeated falls: Secondary | ICD-10-CM | POA: Diagnosis present

## 2019-11-22 DIAGNOSIS — R55 Syncope and collapse: Secondary | ICD-10-CM | POA: Diagnosis present

## 2019-11-22 DIAGNOSIS — N179 Acute kidney failure, unspecified: Secondary | ICD-10-CM | POA: Diagnosis present

## 2019-11-22 DIAGNOSIS — Z79899 Other long term (current) drug therapy: Secondary | ICD-10-CM | POA: Diagnosis not present

## 2019-11-22 DIAGNOSIS — Z79890 Hormone replacement therapy (postmenopausal): Secondary | ICD-10-CM | POA: Diagnosis not present

## 2019-11-22 DIAGNOSIS — K219 Gastro-esophageal reflux disease without esophagitis: Secondary | ICD-10-CM | POA: Diagnosis present

## 2019-11-22 DIAGNOSIS — I10 Essential (primary) hypertension: Secondary | ICD-10-CM

## 2019-11-22 DIAGNOSIS — I129 Hypertensive chronic kidney disease with stage 1 through stage 4 chronic kidney disease, or unspecified chronic kidney disease: Secondary | ICD-10-CM | POA: Diagnosis present

## 2019-11-22 DIAGNOSIS — N182 Chronic kidney disease, stage 2 (mild): Secondary | ICD-10-CM | POA: Diagnosis present

## 2019-11-22 DIAGNOSIS — I248 Other forms of acute ischemic heart disease: Secondary | ICD-10-CM | POA: Diagnosis present

## 2019-11-22 DIAGNOSIS — I951 Orthostatic hypotension: Secondary | ICD-10-CM | POA: Diagnosis present

## 2019-11-22 DIAGNOSIS — Z79891 Long term (current) use of opiate analgesic: Secondary | ICD-10-CM | POA: Diagnosis not present

## 2019-11-22 DIAGNOSIS — R531 Weakness: Secondary | ICD-10-CM | POA: Diagnosis not present

## 2019-11-22 DIAGNOSIS — J302 Other seasonal allergic rhinitis: Secondary | ICD-10-CM | POA: Diagnosis present

## 2019-11-22 DIAGNOSIS — Z8349 Family history of other endocrine, nutritional and metabolic diseases: Secondary | ICD-10-CM | POA: Diagnosis not present

## 2019-11-22 DIAGNOSIS — W19XXXA Unspecified fall, initial encounter: Secondary | ICD-10-CM | POA: Diagnosis present

## 2019-11-22 DIAGNOSIS — Z7289 Other problems related to lifestyle: Secondary | ICD-10-CM | POA: Diagnosis not present

## 2019-11-22 DIAGNOSIS — U071 COVID-19: Secondary | ICD-10-CM | POA: Diagnosis present

## 2019-11-22 DIAGNOSIS — E7800 Pure hypercholesterolemia, unspecified: Secondary | ICD-10-CM | POA: Diagnosis present

## 2019-11-22 DIAGNOSIS — Z833 Family history of diabetes mellitus: Secondary | ICD-10-CM | POA: Diagnosis not present

## 2019-11-22 DIAGNOSIS — E876 Hypokalemia: Secondary | ICD-10-CM | POA: Diagnosis present

## 2019-11-22 DIAGNOSIS — Z8249 Family history of ischemic heart disease and other diseases of the circulatory system: Secondary | ICD-10-CM | POA: Diagnosis not present

## 2019-11-22 DIAGNOSIS — E860 Dehydration: Secondary | ICD-10-CM | POA: Diagnosis present

## 2019-11-22 DIAGNOSIS — E039 Hypothyroidism, unspecified: Secondary | ICD-10-CM | POA: Diagnosis present

## 2019-11-22 DIAGNOSIS — Z90710 Acquired absence of both cervix and uterus: Secondary | ICD-10-CM | POA: Diagnosis not present

## 2019-11-22 DIAGNOSIS — Z960 Presence of urogenital implants: Secondary | ICD-10-CM | POA: Diagnosis present

## 2019-11-22 DIAGNOSIS — Z87891 Personal history of nicotine dependence: Secondary | ICD-10-CM | POA: Diagnosis not present

## 2019-11-22 DIAGNOSIS — H3530 Unspecified macular degeneration: Secondary | ICD-10-CM | POA: Diagnosis present

## 2019-11-22 LAB — CBC
HCT: 35.7 % — ABNORMAL LOW (ref 36.0–46.0)
Hemoglobin: 11.1 g/dL — ABNORMAL LOW (ref 12.0–15.0)
MCH: 30.2 pg (ref 26.0–34.0)
MCHC: 31.1 g/dL (ref 30.0–36.0)
MCV: 97.3 fL (ref 80.0–100.0)
Platelets: 105 10*3/uL — ABNORMAL LOW (ref 150–400)
RBC: 3.67 MIL/uL — ABNORMAL LOW (ref 3.87–5.11)
RDW: 13.9 % (ref 11.5–15.5)
WBC: 7.2 10*3/uL (ref 4.0–10.5)
nRBC: 0 % (ref 0.0–0.2)

## 2019-11-22 LAB — BASIC METABOLIC PANEL
Anion gap: 9 (ref 5–15)
BUN: 30 mg/dL — ABNORMAL HIGH (ref 8–23)
CO2: 20 mmol/L — ABNORMAL LOW (ref 22–32)
Calcium: 8.3 mg/dL — ABNORMAL LOW (ref 8.9–10.3)
Chloride: 111 mmol/L (ref 98–111)
Creatinine, Ser: 1.03 mg/dL — ABNORMAL HIGH (ref 0.44–1.00)
GFR calc Af Amer: 60 mL/min — ABNORMAL LOW (ref >60)
GFR calc non Af Amer: 52 mL/min — ABNORMAL LOW (ref >60)
Glucose, Bld: 78 mg/dL (ref 70–99)
Potassium: 3.4 mmol/L — ABNORMAL LOW (ref 3.5–5.1)
Sodium: 140 mmol/L (ref 135–145)

## 2019-11-22 MED ORDER — POTASSIUM CHLORIDE CRYS ER 20 MEQ PO TBCR
40.0000 meq | EXTENDED_RELEASE_TABLET | Freq: Once | ORAL | Status: AC
  Administered 2019-11-22: 40 meq via ORAL
  Filled 2019-11-22: qty 2

## 2019-11-22 MED ORDER — ENSURE ENLIVE PO LIQD
237.0000 mL | Freq: Two times a day (BID) | ORAL | Status: DC
  Administered 2019-11-22 – 2019-11-23 (×2): 237 mL via ORAL

## 2019-11-22 NOTE — Evaluation (Signed)
Occupational Therapy Evaluation Patient Details Name: Allison [name]: [id number] DOB: Sep 16, 1941 Today's Date: 11/22/2019    History of Present Illness 79 y.o. female with medical history significant of hypothyroidism, hypertension, recently diagnosed with Covid on 1/10 presents from home with complaints of severe weakness, loss of appetite, multiple falls.Patient was diagnosed with Covid on 1/10 and since then she has continued to feel very weak, fatigued, has poor oral intake.  She has fallen multiple times at home.  She was feeling lightheadedness.  She is usually physically active and ambulates without any problem.  She also got Covid vaccine on 1/19 despite being diagnosed with Covid about 9 days ago.  She lives with her husband and her husband was tested negative.   Clinical Impression   Pt overall S- min A with ADL activity.  Pt is weak but with good safety awareness. Husband will A as needed and provide 24/7 A    Follow Up Recommendations  No OT follow up    Equipment Recommendations  None recommended by OT    Recommendations for Other Services       Precautions / Restrictions Precautions Precautions: Fall      Mobility Bed Mobility Overal bed mobility: Independent                Transfers Overall transfer level: Needs assistance Equipment used: Rolling walker (2 wheeled) Transfers: Sit to/from [facility] Sit to Stand: Min assist Stand pivot transfers: Min assist            Balance Overall balance assessment: Mild deficits observed, not formally tested(pt aware she is weak.  Husband has her a walker.)                                         ADL either performed or assessed with clinical judgement   ADL Overall ADL's : Needs assistance/impaired Eating/Feeding: Set up;Sitting   Grooming: Set up;Sitting   Upper Body Bathing: Set up;Sitting   Lower Body Bathing: Minimal assistance;Sit to/from stand   Upper Body  Dressing : Set up;Sitting   Lower Body Dressing: Minimal assistance;Sit to/from stand   Toilet Transfer: Minimal assistance;RW;Ambulation;Cueing for sequencing;Cueing for safety   Toileting- Clothing Manipulation and Hygiene: Set up;Sit to/from stand         General ADL Comments: pt overall S- min A with ADL activity . Husband will A pt as needed at home.     Vision Patient Visual Report: No change from baseline        Extremity/Trunk Assessment Upper Extremity Assessment Upper Extremity Assessment: Generalized weakness           Communication Communication Communication: No difficulties   Cognition Arousal/Alertness: Awake/alert Behavior During Therapy: WFL for tasks assessed/performed Overall Cognitive Status: Within Functional Limits for tasks assessed                                                Home Living Family/patient expects to be discharged to:: Private residence Living Arrangements: Spouse/significant other Available Help at Discharge: Family Type of Home: House       Home Layout: One level     Bathroom Shower/Tub: Occupational psychologist: Standard     Home Equipment: Environmental consultant - 2 wheels  Prior Functioning/Environment Level of Independence: Independent                          OT Goals(Current goals can be found in the care plan section) Acute Rehab OT Goals Patient Stated Goal: home tomorrow OT Goal Formulation: With patient  OT Frequency:      AM-PAC OT "6 Clicks" Daily Activity     Outcome Measure Help from another person eating meals?: None Help from another person taking care of personal grooming?: None Help from another person toileting, which includes using toliet, bedpan, or urinal?: A Little Help from another person bathing (including washing, rinsing, drying)?: A Little Help from another person to put on and taking off regular upper body clothing?: None Help from another person to  put on and taking off regular lower body clothing?: A Little 6 Click Score: 21   End of Session Equipment Utilized During Treatment: Rolling walker;Gait belt  Activity Tolerance: Patient tolerated treatment well Patient left: in chair;with chair alarm set;with call bell/phone within reach                   Time: 0155-0216 OT Time Calculation (min): 21 min Charges:  OT General Charges $OT Visit: 1 Visit OT Evaluation $OT Eval Moderate Complexity: 1 Mod  Kari Baars, OT Acute Rehabilitation Services [phone] Office- [phone]     Dariyon Urquilla, Edwena Felty D 11/22/2019, 4:55 PM

## 2019-11-22 NOTE — Evaluation (Signed)
Physical Therapy Evaluation Patient Details Name: Allison [name]: [id number] DOB: 1941-02-07 Today's Date: 11/22/2019   History of Present Illness  79 y.o. female with medical history significant of hypothyroidism, hypertension, recently diagnosed with Covid on 1/10 presents from home with complaints of severe weakness, loss of appetite, multiple falls.Patient was diagnosed with Covid on 1/10 and since then she has continued to feel very weak, fatigued, has poor oral intake.  She has fallen multiple times at home.  She was feeling lightheadedness.  She is usually physically active and ambulates without any problem.  She also got Covid vaccine on 1/19 despite being diagnosed with Covid about 9 days ago.  She lives with her husband and her husband was tested negative.  Clinical Impression  Pt admitted as above and presenting with functional mobility limitations 2* generalized weakness, decreased endurance and ambulatory balance deficits.  Pt should progress to dc home with assist of family.    Follow Up Recommendations No PT follow up    Equipment Recommendations  None recommended by PT    Recommendations for Other Services       Precautions / Restrictions Precautions Precautions: Fall Restrictions Weight Bearing Restrictions: No      Mobility  Bed Mobility Overal bed mobility: Independent                Transfers Overall transfer level: Needs assistance Equipment used: 1 person hand held assist Transfers: Sit to/from Stand;Stand Pivot Transfers Sit to Stand: Min assist Stand pivot transfers: Min assist       General transfer comment: steady assist  Ambulation/Gait Ambulation/Gait assistance: Min assist Gait Distance (Feet): 80 Feet Assistive device: 1 person hand held assist;IV Pole Gait Pattern/deviations: Step-through pattern;Decreased step length - right;Decreased step length - left;Shuffle;Trunk flexed Gait velocity: decr   General Gait Details: cues for  posture; assist for balance/stability  Stairs            Wheelchair Mobility    Modified Rankin (Stroke Patients Only)       Balance Overall balance assessment: Needs assistance Sitting-balance support: Feet supported;No upper extremity supported Sitting balance-Leahy Scale: Good     Standing balance support: No upper extremity supported Standing balance-Leahy Scale: Fair                               Pertinent Vitals/Pain Pain Assessment: No/denies pain    Home Living Family/patient expects to be discharged to:: Private residence Living Arrangements: Spouse/significant other Available Help at Discharge: Family Type of Home: House       Home Layout: One level Home Equipment: Environmental consultant - 2 wheels;Wheelchair - manual      Prior Function Level of Independence: Independent               Hand Dominance        Extremity/Trunk Assessment   Upper Extremity Assessment Upper Extremity Assessment: Generalized weakness    Lower Extremity Assessment Lower Extremity Assessment: Generalized weakness       Communication   Communication: No difficulties  Cognition Arousal/Alertness: Awake/alert Behavior During Therapy: WFL for tasks assessed/performed Overall Cognitive Status: Within Functional Limits for tasks assessed                                        General Comments      Exercises  Assessment/Plan    PT Assessment Patient needs continued PT services  PT Problem List Decreased strength;Decreased activity tolerance;Decreased balance;Decreased mobility;Decreased knowledge of use of DME       PT Treatment Interventions DME instruction;Gait training;Stair training;Functional mobility training;Therapeutic activities;Therapeutic exercise;Balance training;Patient/family education    PT Goals (Current goals can be found in the Care Plan section)  Acute Rehab PT Goals Patient Stated Goal: home tomorrow PT Goal  Formulation: With patient Time For Goal Achievement: 12/06/19 Potential to Achieve Goals: Good    Frequency Min 3X/week   Barriers to discharge        Co-evaluation PT/OT/SLP Co-Evaluation/Treatment: Yes Reason for Co-Treatment: For patient/therapist safety(Pt with hx of falls and orthostatic hypotension) PT goals addressed during session: Mobility/safety with mobility OT goals addressed during session: ADL's and self-care       AM-PAC PT "6 Clicks" Mobility  Outcome Measure Help needed turning from your back to your side while in a flat bed without using bedrails?: None Help needed moving from lying on your back to sitting on the side of a flat bed without using bedrails?: None Help needed moving to and from a bed to a chair (including a wheelchair)?: A Little Help needed standing up from a chair using your arms (e.g., wheelchair or bedside chair)?: A Little Help needed to walk in hospital room?: A Little Help needed climbing 3-5 steps with a railing? : A Little 6 Click Score: 20    End of Session Equipment Utilized During Treatment: Gait belt Activity Tolerance: Patient tolerated treatment well Patient left: in chair;with call bell/phone within reach;with chair alarm set Nurse Communication: Mobility status PT Visit Diagnosis: Unsteadiness on feet (R26.81);Muscle weakness (generalized) (M62.81);Difficulty in walking, not elsewhere classified (R26.2)    Time: 1359-1420 PT Time Calculation (min) (ACUTE ONLY): 21 min   Charges:   PT Evaluation $PT Eval Low Complexity: 1 Low          [facility] Pager [phone] Office [phone]   Lameeka Schleifer 11/22/2019, 5:35 PM

## 2019-11-22 NOTE — Evaluation (Signed)
Clinical/Bedside Swallow Evaluation Patient Details  Name: Allison [name]: [id number] Date of Birth: 07-02-1941  Today's Date: 11/22/2019 Time: SLP Start Time (ACUTE ONLY): 0845 SLP Stop Time (ACUTE ONLY): 0912 SLP Time Calculation (min) (ACUTE ONLY): 27 min  Past Medical History:  Past Medical History:  Diagnosis Date  . Arthritis    lower back  . Former smoker   . GERD (gastroesophageal reflux disease)   . History of appendicitis   . History of blood transfusion 1960   after delivery of son  . History of cataract   . History of kidney stones 1999   lithrotripsy  . Hypercholesterolemia   . Hypertension   . Hypothyroid   . Iron deficiency anemia   . Macular degeneration    bilateral  . Pinched nerve    back  . Seasonal allergies    RHINITIS  . Tachycardia   . Wears glasses    reading   Past Surgical History:  Past Surgical History:  Procedure Laterality Date  . ABDOMINAL HYSTERECTOMY    . APPENDECTOMY    . cataract surgery Bilateral 03/2014   polyps  . COLONOSCOPY  02/2010  . CYSTOSCOPY W/ URETERAL STENT PLACEMENT Right 09/07/2019   Procedure: CYSTOSCOPY WITH STENT REPLACEMENT WITH RETROGRADES;  Surgeon: Alexis Frock, MD;  Location: [facility];  Service: Urology;  Laterality: Right;  . CYSTOSCOPY/RETROGRADE/URETEROSCOPY Bilateral 07/18/2019   Procedure: CYSTOSCOPY/RETROGRADE/RIGHT URETEROSCOPY/BLADDER BIOPSY/STENT PLACEMENT/RIGHT URETERAL MASS BIOPSY;  Surgeon: Alexis Frock, MD;  Location: [facility];  Service: Urology;  Laterality: Bilateral;  1 HR   HPI:  Ms Allison [name], 78y/f, presented to ED with severe weakness, loss of appetite, multiple falls and Covid 19 infection (11/04/19 positive test). PMH of hypothyroidism and hypertension. No previous history of dysphagia.    Assessment / Plan / Recommendation Clinical Impression  Clinical Swallow Evaluation completed at bedside. Pt presents with no signs of dysphagia. Observed pt across all  consistencies (breakfast tray of Regular diet, thin liquids present) and with medication taken whole with liquids given by nurse. No signs or symptoms observed throughout meal. Pt reports a poor appetite, however no history or present issues with swallowing. No throat clear, coughing or vocal changes. Recommend pt continue current diet. Pt did report drinking ensure supplements over the last several weeks due to her decreased appetite. Medication to be given whole with liquids. No further speech therapy services are recommended at this time.  SLP Visit Diagnosis: Dysphagia, unspecified (R13.10)    Aspiration Risk  Mild aspiration risk    Diet Recommendation Regular;Thin liquid   Liquid Administration via: Cup;Straw Medication Administration: Whole meds with liquid Supervision: Patient able to self feed Compensations: Slow rate;Small sips/bites Postural Changes: Seated upright at 90 degrees    Other  Recommendations Oral Care Recommendations: Oral care BID   Follow up Recommendations None        Swallow Study   General Date of Onset: 11/21/19 HPI: Ms Allison [name], 78y/f, presented to ED with severe weakness, loss of appetite, multiple falls and Covid 19 infection (11/04/19 positive test). PMH of hypothyroidism and hypertension. No previous history of dysphagia.  Type of Study: Bedside Swallow Evaluation Previous Swallow Assessment: none Diet Prior to this Study: Regular;Thin liquids Temperature Spikes Noted: No Respiratory Status: Room air History of Recent Intubation: No Behavior/Cognition: Alert;Cooperative;Pleasant mood Oral Cavity Assessment: Within Functional Limits Oral Care Completed by SLP: No Oral Cavity - Dentition: Adequate natural dentition Vision: Functional for self-feeding Self-Feeding Abilities: Able to feed self Patient Positioning:  Upright in bed Baseline Vocal Quality: Normal Volitional Cough: Strong Volitional Swallow: Able to elicit    Oral/Motor/Sensory  Function Overall Oral Motor/Sensory Function: Within functional limits   Ice Chips Ice chips: Within functional limits Presentation: Spoon   Thin Liquid Thin Liquid: Within functional limits Presentation: Cup;Straw    Nectar Thick Nectar Thick Liquid: Not tested   Honey Thick Honey Thick Liquid: Not tested   Puree Puree: Within functional limits   Solid     Solid: Within functional limits     Wynelle Bourgeois., MA CCC-SLP 11/22/2019,9:21 AM

## 2019-11-22 NOTE — Progress Notes (Signed)
PROGRESS NOTE    Allison [name]  [id number] DOB: 1941-03-08 DOA: 11/21/2019 PCP: Denita Lung, MD    Brief Narrative:  79 y.o. female with medical history significant of hypothyroidism, hypertension, recently diagnosed with Covid on 1/10 presents from home with complaints of severe weakness, loss of appetite, multiple falls. Patient was diagnosed with Covid on 1/10 and since then she has continued to feel very weak, fatigued, has poor oral intake.  She has fallen multiple times at home.  She was feeling lightheadedness.  She is usually physically active and ambulates without any problem.  She also got Covid vaccine on 1/19 despite being diagnosed with Covid about 9 days ago.  She lives with her husband and her husband was tested negative. Patient seen and examined at the bedside in the emergency department.  She denies any chest pain, shortness of breath, cough, abdominal pain, nausea, vomiting or diarrhea.  Her only complaint was fatigue.  ED Course: Hemodynamically stable.  Chest imagings suggestive of interstitial opacities on bilateral bases but her lungs were clear on auscultation.  D-dimer was mildly elevated.  Chest angiogram negative for PE.  She has mild elevated troponin.  Has mild AKI. Since she is not hypoxic, and her positive test was about 3 weeks ago, she was not kept on isolation.  She was found to be orthostatic and started on IV fluids.  Assessment & Plan:   Principal Problem:   Generalized weakness Active Problems:   HYPERCHOLESTEROLEMIA   Essential hypertension   GERD   Orthostatic hypotension   Generalized weakness likely secondary to dehydration:  - Has poor appetite, poor oral intake at home leading up to hospital presentation - Also fell multiple times at home.  Usually ambulatory without any problem at baseline. -PT/OT consulted -Received IVF overnight with improvement. Pt tolerating PO diet thus far  Orthostatic hypotension:  -Most likely  secondary to dehydration due to poor oral intake.  -Antihypertensives have been held.   -Continue IV fluids as tolerated.  Check orthostatic vitals tomorrow.  History of recent Covid:  -Tested positive on 1/10.  Currently denies any shortness of breath or cough.  Lungs are clear on auscultation.  She got the first dose of covid  vaccine on 1/19 despite being tested positive just 9 days prior.  -Continue isolation   AKI on CKD stage II::  -Baseline creatinine around 1.2.  Secondary to dehydration from poor oral intake.  -Improved with IV fluids.   -Repeat bmet in AM  Elevated troponin:  -Flat trend noted.  Denies any chest pain this AM -Most likely due to supply demand ischemia.  .  Hypertension:  -Home antihypertensives on hold.   -BP currently stable and controlled  Hypothyroidism:  -Continue Synthyroid as tolerated  DVT prophylaxis: Lovenox subq Code Status: Full Family Communication: Pt in room, family not at bedside Disposition Plan: Pending PT eval, d/c when no longer orthostatic  Consultants:     Procedures:     Antimicrobials: Anti-infectives (From admission, onward)   None       Subjective: Feels better today. Eating better this AM  Objective: Vitals:   11/21/19 1830 11/21/19 2059 11/21/19 2109 11/22/19 0623  BP: (!) 126/57 125/74  (!) 134/59  Pulse: 66 89  67  Resp: 15 18  17   Temp:  98.2 F (36.8 C)  97.6 F (36.4 C)  TempSrc:  Oral  Oral  SpO2: 98% 100%  100%  Weight:   67.8 kg   Height:   5'  6" (1.676 m)     Intake/Output Summary (Last 24 hours) at 11/22/2019 1251 Last data filed at 11/22/2019 1107 Gross per 24 hour  Intake 1969.84 ml  Output --  Net 1969.84 ml   Filed Weights   11/21/19 2109  Weight: 67.8 kg    Examination:  General exam: Appears calm and comfortable  Respiratory system: Clear to auscultation. Respiratory effort normal. Cardiovascular system: S1 & S2 heard, Regular Gastrointestinal system: Abdomen is  nondistended, soft and nontender. No organomegaly or masses felt. Normal bowel sounds heard. Central nervous system: Alert and oriented. No focal neurological deficits. Extremities: Symmetric 5 x 5 power. Skin: No rashes, lesions Psychiatry: Judgement and insight appear normal. Mood & affect appropriate.   Data Reviewed: I have personally reviewed following labs and imaging studies  CBC: Recent Labs  Lab 11/21/19 1147 11/22/19 0301  WBC 11.7* 7.2  NEUTROABS 9.8*  --   HGB 14.5 11.1*  HCT 44.4 35.7*  MCV 94.1 97.3  PLT PLATELET CLUMPS NOTED ON SMEAR, UNABLE TO ESTIMATE [id number]*   Basic Metabolic Panel: Recent Labs  Lab 11/21/19 1147 11/22/19 0301  NA 140 140  K 3.5 3.4*  CL 103 111  CO2 21* 20*  GLUCOSE 145* 78  BUN 42* 30*  CREATININE 1.41* 1.03*  CALCIUM 9.5 8.3*   GFR: Estimated Creatinine Clearance: 41.5 mL/min (A) (by C-G formula based on SCr of 1.03 mg/dL (H)). Liver Function Tests: Recent Labs  Lab 11/21/19 1147  AST 32  ALT 18  ALKPHOS 48  BILITOT 1.2  PROT 7.4  ALBUMIN 3.5   No results for input(s): LIPASE, AMYLASE in the last 168 hours. No results for input(s): AMMONIA in the last 168 hours. Coagulation Profile: No results for input(s): INR, PROTIME in the last 168 hours. Cardiac Enzymes: Recent Labs  Lab 11/21/19 1147  CKTOTAL 66   BNP (last 3 results) No results for input(s): PROBNP in the last 8760 hours. HbA1C: No results for input(s): HGBA1C in the last 72 hours. CBG: Recent Labs  Lab 11/21/19 1116  GLUCAP 120*   Lipid Profile: No results for input(s): CHOL, HDL, LDLCALC, TRIG, CHOLHDL, LDLDIRECT in the last 72 hours. Thyroid Function Tests: No results for input(s): TSH, T4TOTAL, FREET4, T3FREE, THYROIDAB in the last 72 hours. Anemia Panel: No results for input(s): VITAMINB12, FOLATE, FERRITIN, TIBC, IRON, RETICCTPCT in the last 72 hours. Sepsis Labs: No results for input(s): PROCALCITON, LATICACIDVEN in the last 168 hours.  Recent  Results (from the past 240 hour(s))  Respiratory Panel by RT PCR (Flu A&B, Covid) - Nasopharyngeal Swab     Status: Abnormal   Collection Time: 11/21/19  4:51 PM   Specimen: Nasopharyngeal Swab  Result Value Ref Range Status   SARS Coronavirus 2 by RT PCR POSITIVE (A) NEGATIVE Final    Comment: RESULT CALLED TO, READ BACK BY AND VERIFIED WITH: BINGHAM,S. RN @1822  ON 01.27.2021 BY COHEN,K (NOTE) SARS-CoV-2 target nucleic acids are DETECTED. SARS-CoV-2 RNA is generally detectable in upper respiratory specimens  during the acute phase of infection. Positive results are indicative of the presence of the identified virus, but do not rule out bacterial infection or co-infection with other pathogens not detected by the test. Clinical correlation with patient history and other diagnostic information is necessary to determine patient infection status. The expected result is Negative. Fact Sheet for Patients:  PinkCheek.be Fact Sheet for Healthcare Providers: GravelBags.it This test is not yet approved or cleared by the [place] and  has been authorized  for detection and/or diagnosis of SARS-CoV-2 by FDA under an Emergency Use Authorization (EUA).  This EUA will remain in effect (meaning this test can b e used) for the duration of  the COVID-19 declaration under Section 564(b)(1) of the Act, 21 U.S.C. section 360bbb-3(b)(1), unless the authorization is terminated or revoked sooner.    Influenza A by PCR NEGATIVE NEGATIVE Final   Influenza B by PCR NEGATIVE NEGATIVE Final    Comment: (NOTE) The Xpert Xpress SARS-CoV-2/FLU/RSV assay is intended as an aid in  the diagnosis of influenza from Nasopharyngeal swab specimens and  should not be used as a sole basis for treatment. Nasal washings and  aspirates are unacceptable for Xpert Xpress SARS-CoV-2/FLU/RSV  testing. Fact Sheet for  Patients: PinkCheek.be Fact Sheet for Healthcare Providers: GravelBags.it This test is not yet approved or cleared by the [place] FDA and  has been authorized for detection and/or diagnosis of SARS-CoV-2 by  FDA under an Emergency Use Authorization (EUA). This EUA will remain  in effect (meaning this test can be used) for the duration of the  Covid-19 declaration under Section 564(b)(1) of the Act, 21  U.S.C. section 360bbb-3(b)(1), unless the authorization is  terminated or revoked. Performed at [facility], [place] [address], [place], [place] [address]      Radiology Studies: CT Head Wo Contrast  Result Date: 11/21/2019 CLINICAL DATA:  [place] yesterday. Bruising around the right eye and right cheek. COVID-19 positive 2 weeks ago. EXAM: CT HEAD WITHOUT CONTRAST CT MAXILLOFACIAL WITHOUT CONTRAST TECHNIQUE: Multidetector CT imaging of the head and maxillofacial structures were performed using the standard protocol without intravenous contrast. Multiplanar CT image reconstructions of the maxillofacial structures were also generated. COMPARISON:  None. FINDINGS: CT HEAD FINDINGS Brain: No evidence of acute infarction, hemorrhage, hydrocephalus, extra-axial collection or mass lesion/mass effect. Mild generalized cerebral atrophy. Scattered periventricular and subcortical white matter hypodensities are nonspecific, but favored to reflect chronic microvascular ischemic changes. Vascular: Atherosclerotic vascular calcification of the carotid siphons. No hyperdense vessel. Skull: Normal. Negative for fracture or focal lesion. Other: None. CT MAXILLOFACIAL FINDINGS Osseous: No fracture or mandibular dislocation. No destructive process. There are a few left maxillary periapical lucencies. Severe degenerative disc disease at C5-C6 with at least mild spinal canal stenosis. Orbits: Negative. No traumatic or inflammatory finding.  Sinuses: Clear. Soft tissues: Mild right premalar soft tissue swelling. IMPRESSION: 1. No acute intracranial abnormality. 2. No acute maxillofacial fracture. Mild right premalar soft tissue swelling. Electronically Signed   By: Titus Dubin M.D.   On: 11/21/2019 12:50   CT Angio Chest PE W and/or Wo Contrast  Result Date: 11/21/2019 CLINICAL DATA:  Weakness and dizziness. EXAM: CT ANGIOGRAPHY CHEST WITH CONTRAST TECHNIQUE: Multidetector CT imaging of the chest was performed using the standard protocol during bolus administration of intravenous contrast. Multiplanar CT image reconstructions and MIPs were obtained to evaluate the vascular anatomy. CONTRAST:  150mL OMNIPAQUE IOHEXOL 350 MG/ML SOLN COMPARISON:  April 08, 2006 FINDINGS: Cardiovascular: There is marked severity calcification of the thoracic aorta satisfactory opacification of the pulmonary arteries to the segmental level. No evidence of pulmonary embolism. Normal heart size. No pericardial effusion. Marked severity coronary artery calcification is seen Mediastinum/Nodes: No enlarged mediastinal, hilar, or axillary lymph nodes. Lungs/Pleura: Very mild, hazy atelectasis and/or infiltrate is seen along the periphery of the bilateral lower lobes. There is no evidence of a pleural effusion or pneumothorax. Upper Abdomen: No acute abnormality. Musculoskeletal: Bilateral breast implants are seen. Multilevel degenerative changes are noted  throughout the thoracic spine. Review of the MIP images confirms the above findings. IMPRESSION: 1. No CT evidence of acute pulmonary embolism. 2. Very mild hazy atelectasis and/or infiltrate along the periphery of the bilateral lower lobes. 3. Marked severity coronary artery calcification. Electronically Signed   By: Virgina Norfolk M.D.   On: 11/21/2019 15:39   DG Chest Portable 1 View  Result Date: 11/21/2019 CLINICAL DATA:  Dyspnea, COVID-19 positive EXAM: PORTABLE CHEST 1 VIEW COMPARISON:  06/07/2008 FINDINGS:  Heart size within normal limits. Aortic atherosclerosis. Mild interstitial opacities within the bilateral lung bases, right slightly greater than left. No pleural effusion or pneumothorax. IMPRESSION: Mild interstitial opacities within the bilateral lung bases, right slightly greater than left. Findings suspicious for developing atypical infection. Electronically Signed   By: Davina Poke D.O.   On: 11/21/2019 12:48   CT Maxillofacial Wo Contrast  Result Date: 11/21/2019 CLINICAL DATA:  [place] yesterday. Bruising around the right eye and right cheek. COVID-19 positive 2 weeks ago. EXAM: CT HEAD WITHOUT CONTRAST CT MAXILLOFACIAL WITHOUT CONTRAST TECHNIQUE: Multidetector CT imaging of the head and maxillofacial structures were performed using the standard protocol without intravenous contrast. Multiplanar CT image reconstructions of the maxillofacial structures were also generated. COMPARISON:  None. FINDINGS: CT HEAD FINDINGS Brain: No evidence of acute infarction, hemorrhage, hydrocephalus, extra-axial collection or mass lesion/mass effect. Mild generalized cerebral atrophy. Scattered periventricular and subcortical white matter hypodensities are nonspecific, but favored to reflect chronic microvascular ischemic changes. Vascular: Atherosclerotic vascular calcification of the carotid siphons. No hyperdense vessel. Skull: Normal. Negative for fracture or focal lesion. Other: None. CT MAXILLOFACIAL FINDINGS Osseous: No fracture or mandibular dislocation. No destructive process. There are a few left maxillary periapical lucencies. Severe degenerative disc disease at C5-C6 with at least mild spinal canal stenosis. Orbits: Negative. No traumatic or inflammatory finding. Sinuses: Clear. Soft tissues: Mild right premalar soft tissue swelling. IMPRESSION: 1. No acute intracranial abnormality. 2. No acute maxillofacial fracture. Mild right premalar soft tissue swelling. Electronically Signed   By: Titus Dubin M.D.    On: 11/21/2019 12:50    Scheduled Meds: . enoxaparin (LOVENOX) injection  40 mg Subcutaneous Q24H  . feeding supplement (ENSURE ENLIVE)  237 mL Oral BID BM  . multivitamin with minerals  1 tablet Oral Daily  . pantoprazole  40 mg Oral Daily  . simvastatin  40 mg Oral Daily  . thyroid  60 mg Oral QAC breakfast   Continuous Infusions: . sodium chloride 100 mL/hr at 11/22/19 [id number]     LOS: 0 days   Marylu Lund, MD [facility] Pager On Amion  If 7PM-7AM, please contact night-coverage 11/22/2019, 12:51 PM

## 2019-11-22 NOTE — Telephone Encounter (Signed)
Called and checked on pt . She is currently admitted in [place] long. Per husband she has improved. Allison [name]

## 2019-11-22 NOTE — Progress Notes (Signed)
Initial Nutrition Assessment  RD working remotely.   DOCUMENTATION CODES:   Not applicable  INTERVENTION:  - will order Ensure Enlive BID, each supplement provides 350 kcal and 20 grams of protein. - will order Magic Cup BID with meals, each supplement provides 290 kcal and 9 grams of protein. - continue to encourage PO intakes.   NUTRITION DIAGNOSIS:   Increased nutrient needs related to acute illness, catabolic [id number] infection) as evidenced by estimated needs.  GOAL:   Patient will meet greater than or equal to 90% of their needs  MONITOR:   PO intake, Supplement acceptance, Labs, Weight trends  REASON FOR ASSESSMENT:   Malnutrition Screening Tool  ASSESSMENT:   79 y.o. female with medical history of hypothyroidism, HTN, diagnosed with COVID-19 on 1/10. She presented to the ED from home with complaints of severe weakness, loss of appetite, poor oral intake, and multiple falls. She is usually physically active and ambulates without any problem. She got the COVID vaccine on 1/19 despite COVID positive diagnosis on 1/10. She lives with her husband, who tested negative. She denied any SOB, cough, abdominal pain, N/V/D.  No intakes documented since admission. Weight yesterday was 149 lb and weight on 09/04/19 was 176 lb, which is closer to UBW. This indicates 27 lb weight loss (15% body weight) in the past 2.5 months. Significant for time frame.   She reports that her appetite has been poor since the time of COVID-19 diagnosis (2 weeks). During that time she was mainly eating small portions or bites of items throughout the day. She found it easier to get adequate fluids so she began drinking 1-2 bottles of Ensure/day.   Unable to state malnutrition degree at this time d/t inability to complete NFPE, but patient either meets criteria for malnutrition or is at very high risk.   Patient was seen by SLP this AM whose note states that patient has no signs of dysphagia and  that regular consistency/texture foods and thin liquids are recommended.   Per notes: - generalized weakness - orthostatic hypotension - COVID-19 positive  - AKI on hx of stage 2 CKD - Observation status   Labs reviewed; K: 3.4 mmol/l, BUN: 30 mg/dl, creatinine: 1.03 mg/dl, Ca: 8.3 mg/dl. Medications reviewed; daily multivitamin with minerals, 40 mg oral protonix/day, 40 mEq Klor-Con x1 dose 1/28. IVF; NS @ 100 ml/hr.     NUTRITION - FOCUSED PHYSICAL EXAM:  unable to complete for COVID+ patient.   Diet Order:   Diet Order            Diet Heart Room service appropriate? Yes; Fluid consistency: Thin  Diet effective now              EDUCATION NEEDS:   No education needs have been identified at this time  Skin:  Skin Assessment: Reviewed RN Assessment  Last BM:  PTA/unknown  Height:   Ht Readings from Last 1 Encounters:  11/21/19 5\' 6"  (1.676 m)    Weight:   Wt Readings from Last 1 Encounters:  11/21/19 67.8 kg    Ideal Body Weight:  59.1 kg  BMI:  Body mass index is 24.13 kg/m.  Estimated Nutritional Needs:   Kcal:  1900-2170 kcal  Protein:  80-95 grams  Fluid:  >/= 2 L/day     Jarome Matin, MS, RD, LDN, [facility] Inpatient Clinical Dietitian Pager # [phone] After hours/weekend pager # [phone]

## 2019-11-23 DIAGNOSIS — I951 Orthostatic hypotension: Secondary | ICD-10-CM

## 2019-11-23 LAB — CBC
HCT: 30.6 % — ABNORMAL LOW (ref 36.0–46.0)
Hemoglobin: 9.6 g/dL — ABNORMAL LOW (ref 12.0–15.0)
MCH: 30.3 pg (ref 26.0–34.0)
MCHC: 31.4 g/dL (ref 30.0–36.0)
MCV: 96.5 fL (ref 80.0–100.0)
Platelets: 99 10*3/uL — ABNORMAL LOW (ref 150–400)
RBC: 3.17 MIL/uL — ABNORMAL LOW (ref 3.87–5.11)
RDW: 13.8 % (ref 11.5–15.5)
WBC: 6.6 10*3/uL (ref 4.0–10.5)
nRBC: 0 % (ref 0.0–0.2)

## 2019-11-23 LAB — COMPREHENSIVE METABOLIC PANEL
ALT: 14 U/L (ref 0–44)
AST: 22 U/L (ref 15–41)
Albumin: 2.3 g/dL — ABNORMAL LOW (ref 3.5–5.0)
Alkaline Phosphatase: 34 U/L — ABNORMAL LOW (ref 38–126)
Anion gap: 5 (ref 5–15)
BUN: 18 mg/dL (ref 8–23)
CO2: 21 mmol/L — ABNORMAL LOW (ref 22–32)
Calcium: 8.1 mg/dL — ABNORMAL LOW (ref 8.9–10.3)
Chloride: 113 mmol/L — ABNORMAL HIGH (ref 98–111)
Creatinine, Ser: 0.75 mg/dL (ref 0.44–1.00)
GFR calc Af Amer: >60 mL/min (ref >60)
GFR calc non Af Amer: >60 mL/min (ref >60)
Glucose, Bld: 93 mg/dL (ref 70–99)
Potassium: 3.3 mmol/L — ABNORMAL LOW (ref 3.5–5.1)
Sodium: 139 mmol/L (ref 135–145)
Total Bilirubin: 0.8 mg/dL (ref 0.3–1.2)
Total Protein: 4.9 g/dL — ABNORMAL LOW (ref 6.5–8.1)

## 2019-11-23 MED ORDER — POTASSIUM CHLORIDE CRYS ER 20 MEQ PO TBCR
40.0000 meq | EXTENDED_RELEASE_TABLET | Freq: Once | ORAL | Status: AC
  Administered 2019-11-23: 40 meq via ORAL
  Filled 2019-11-23: qty 2

## 2019-11-23 MED ORDER — VITAMIN C 1000 MG PO TABS: 1000.0000 mg | ORAL_TABLET | Freq: Every day | ORAL | 0 refills | Status: AC

## 2019-11-23 MED ORDER — ZINC 220 (50 ZN) MG PO CAPS: 1.0000 | ORAL_CAPSULE | Freq: Every day | ORAL | 0 refills | Status: AC

## 2019-11-23 NOTE — Discharge Summary (Signed)
Physician Discharge Summary  Allison [name] [id number] DOB: 09/10/1941 DOA: 11/21/2019  PCP: Denita Lung, MD  Admit date: 11/21/2019 Discharge date: 11/23/2019  Admitted From: Home Disposition:  Home  Recommendations for Outpatient Follow-up:  1. Follow up with PCP in 2-3 weeks 2. Please note, blood pressure medications were held given presenting orthostasis. BP noted to be very well controlled without BP med on discharge. Please follow blood pressure trends and consider resuming bp meds down the road as BP tolerates  Discharge Condition:Improved CODE STATUS:Full Diet recommendation: Regular   Brief/Interim Summary: 79 y.o.femalewith medical history significant ofhypothyroidism, hypertension, recently diagnosed with Covid on 1/10 presents from home with complaints of severe weakness, loss of appetite, multiple falls. Patient was diagnosed with Covid on 1/10 and since then she has continued to feel very weak, fatigued,has poor oral intake.She has fallen multiple times at home. She was feeling lightheadedness. She is usually physically active and ambulates without any problem. She also got Covid vaccine on 1/19 despite being diagnosed with Covid about 9 days ago. She lives with her husband and her husband was tested negative. Patient seen and examined at the bedside in the emergency department. She denies any chest pain, shortness of breath, cough, abdominal pain, nausea, vomiting or diarrhea. Her only complaint was fatigue.  ED Course:Hemodynamically stable. Chest imagings suggestive of interstitial opacities on bilateral bases but her lungs were clear on auscultation. D-dimer was mildly elevated. Chest angiogram negative for PE. She has mild elevated troponin. Has mild AKI. Since she is not hypoxic,and her positive test was about 3 weeks ago,she was not kept on isolation. She was found to be orthostatic and started on IV fluids.  Discharge Diagnoses:  Principal  Problem:   Generalized weakness Active Problems:   HYPERCHOLESTEROLEMIA   Essential hypertension   GERD   Orthostatic hypotension  Generalized weakness likely secondary to dehydration: -Presented with poor appetite,poor oral intake at home leading up to hospital presentation, likely related to concurrent covid infection -Also fell multiple times at home. Usually ambulatory without any problem at baseline. -PT/OT consulted, no recommendations -Received IVF this admit with clinical improvement. No longer orthostatic  Orthostatic hypotension: -Most likely secondary to dehydration due to poor oral intake. -Antihypertensives have been held.  -resolved  History ofrecentCovid: -Tested positive on 1/10. Currently denies any shortness of breath or cough. Lungs are clear on auscultation. She got the first dose of covidvaccine on 1/19 despite being tested positive just 9 days prior.  -recommend continue zinc and vit c  AKIon CKD stage II::  -Baseline creatinine around 1.2. Secondary to dehydration from poor oral intake.  -Improved with IV fluids.   Elevated troponin:  -Flat trend noted. Denies any chest pain this visit -Most likely due to supply demand ischemia. .  Hypertension: -Home antihypertensives on hold.  -BP currently stable and controlled -Remained stable at time of d/c without bp meds. Would monitor bp as outpatient and consider resuming BP meds later as blood pressure tolerates  Hypothyroidism:  -Continued Synthyroid as tolerated   Discharge Instructions   Allergies as of 11/23/2019      Reactions   Niacin Rash      Medication List    STOP taking these medications   losartan-hydrochlorothiazide 100-25 MG tablet Commonly known as: HYZAAR     TAKE these medications   Multivitamin Adult Tabs Take 1 tablet by mouth daily.   omeprazole 20 MG capsule Commonly known as: PRILOSEC Take 1 capsule (20 mg total) by mouth daily.  simvastatin 40 MG tablet Commonly known as: ZOCOR Take 1 tablet (40 mg total) by mouth daily.   thyroid 60 MG tablet Commonly known as: Armour Thyroid Take 1 tablet (60 mg total) by mouth daily before breakfast.   vitamin C 1000 MG tablet Take 1 tablet (1,000 mg total) by mouth daily for 7 days.   Zinc 220 (50 Zn) MG Caps Take 1 capsule by mouth daily for 7 days.      Follow-up Information    Denita Lung, MD. Schedule an appointment as soon as possible for a visit in 3 week(s).   Specialty: Family Medicine Contact information: [address] [place] [address] [phone]          Allergies  Allergen Reactions  . Niacin Rash      Procedures/Studies: CT Head Wo Contrast  Result Date: 11/21/2019 CLINICAL DATA:  [place] yesterday. Bruising around the right eye and right cheek. COVID-19 positive 2 weeks ago. EXAM: CT HEAD WITHOUT CONTRAST CT MAXILLOFACIAL WITHOUT CONTRAST TECHNIQUE: Multidetector CT imaging of the head and maxillofacial structures were performed using the standard protocol without intravenous contrast. Multiplanar CT image reconstructions of the maxillofacial structures were also generated. COMPARISON:  None. FINDINGS: CT HEAD FINDINGS Brain: No evidence of acute infarction, hemorrhage, hydrocephalus, extra-axial collection or mass lesion/mass effect. Mild generalized cerebral atrophy. Scattered periventricular and subcortical white matter hypodensities are nonspecific, but favored to reflect chronic microvascular ischemic changes. Vascular: Atherosclerotic vascular calcification of the carotid siphons. No hyperdense vessel. Skull: Normal. Negative for fracture or focal lesion. Other: None. CT MAXILLOFACIAL FINDINGS Osseous: No fracture or mandibular dislocation. No destructive process. There are a few left maxillary periapical lucencies. Severe degenerative disc disease at C5-C6 with at least mild spinal canal stenosis. Orbits: Negative. No traumatic  or inflammatory finding. Sinuses: Clear. Soft tissues: Mild right premalar soft tissue swelling. IMPRESSION: 1. No acute intracranial abnormality. 2. No acute maxillofacial fracture. Mild right premalar soft tissue swelling. Electronically Signed   By: Titus Dubin M.D.   On: 11/21/2019 12:50   CT Angio Chest PE W and/or Wo Contrast  Result Date: 11/21/2019 CLINICAL DATA:  Weakness and dizziness. EXAM: CT ANGIOGRAPHY CHEST WITH CONTRAST TECHNIQUE: Multidetector CT imaging of the chest was performed using the standard protocol during bolus administration of intravenous contrast. Multiplanar CT image reconstructions and MIPs were obtained to evaluate the vascular anatomy. CONTRAST:  188mL OMNIPAQUE IOHEXOL 350 MG/ML SOLN COMPARISON:  April 08, 2006 FINDINGS: Cardiovascular: There is marked severity calcification of the thoracic aorta satisfactory opacification of the pulmonary arteries to the segmental level. No evidence of pulmonary embolism. Normal heart size. No pericardial effusion. Marked severity coronary artery calcification is seen Mediastinum/Nodes: No enlarged mediastinal, hilar, or axillary lymph nodes. Lungs/Pleura: Very mild, hazy atelectasis and/or infiltrate is seen along the periphery of the bilateral lower lobes. There is no evidence of a pleural effusion or pneumothorax. Upper Abdomen: No acute abnormality. Musculoskeletal: Bilateral breast implants are seen. Multilevel degenerative changes are noted throughout the thoracic spine. Review of the MIP images confirms the above findings. IMPRESSION: 1. No CT evidence of acute pulmonary embolism. 2. Very mild hazy atelectasis and/or infiltrate along the periphery of the bilateral lower lobes. 3. Marked severity coronary artery calcification. Electronically Signed   By: Virgina Norfolk M.D.   On: 11/21/2019 15:39   DG Chest Portable 1 View  Result Date: 11/21/2019 CLINICAL DATA:  Dyspnea, COVID-19 positive EXAM: PORTABLE CHEST 1 VIEW  COMPARISON:  06/07/2008 FINDINGS: Heart size within normal limits.  Aortic atherosclerosis. Mild interstitial opacities within the bilateral lung bases, right slightly greater than left. No pleural effusion or pneumothorax. IMPRESSION: Mild interstitial opacities within the bilateral lung bases, right slightly greater than left. Findings suspicious for developing atypical infection. Electronically Signed   By: Davina Poke D.O.   On: 11/21/2019 12:48   CT Maxillofacial Wo Contrast  Result Date: 11/21/2019 CLINICAL DATA:  [place] yesterday. Bruising around the right eye and right cheek. COVID-19 positive 2 weeks ago. EXAM: CT HEAD WITHOUT CONTRAST CT MAXILLOFACIAL WITHOUT CONTRAST TECHNIQUE: Multidetector CT imaging of the head and maxillofacial structures were performed using the standard protocol without intravenous contrast. Multiplanar CT image reconstructions of the maxillofacial structures were also generated. COMPARISON:  None. FINDINGS: CT HEAD FINDINGS Brain: No evidence of acute infarction, hemorrhage, hydrocephalus, extra-axial collection or mass lesion/mass effect. Mild generalized cerebral atrophy. Scattered periventricular and subcortical white matter hypodensities are nonspecific, but favored to reflect chronic microvascular ischemic changes. Vascular: Atherosclerotic vascular calcification of the carotid siphons. No hyperdense vessel. Skull: Normal. Negative for fracture or focal lesion. Other: None. CT MAXILLOFACIAL FINDINGS Osseous: No fracture or mandibular dislocation. No destructive process. There are a few left maxillary periapical lucencies. Severe degenerative disc disease at C5-C6 with at least mild spinal canal stenosis. Orbits: Negative. No traumatic or inflammatory finding. Sinuses: Clear. Soft tissues: Mild right premalar soft tissue swelling. IMPRESSION: 1. No acute intracranial abnormality. 2. No acute maxillofacial fracture. Mild right premalar soft tissue swelling. Electronically  Signed   By: Titus Dubin M.D.   On: 11/21/2019 12:50     Subjective: Feeling better. Eager to go home  Discharge Exam: Vitals:   11/22/19 2031 11/23/19 0641  BP: (!) 104/46 (!) 126/49  Pulse: 73 78  Resp: 18 20  Temp: 97.7 F (36.5 C) 98.5 F (36.9 C)  SpO2: 100% 98%   Vitals:   11/22/19 1739 11/22/19 1740 11/22/19 2031 11/23/19 0641  BP:   (!) 104/46 (!) 126/49  Pulse:   73 78  Resp:   18 20  Temp:   97.7 F (36.5 C) 98.5 F (36.9 C)  TempSrc:   Oral Oral  SpO2: 100% 99% 100% 98%  Weight:      Height:        General: Pt is alert, awake, not in acute distress Cardiovascular: RRR, S1/S2 +, no rubs, no gallops Respiratory: CTA bilaterally, no wheezing, no rhonchi Abdominal: Soft, NT, ND, bowel sounds + Extremities: no edema, no cyanosis   The results of significant diagnostics from this hospitalization (including imaging, microbiology, ancillary and laboratory) are listed below for reference.     Microbiology: Recent Results (from the past 240 hour(s))  Respiratory Panel by RT PCR (Flu A&B, Covid) - Nasopharyngeal Swab     Status: Abnormal   Collection Time: 11/21/19  4:51 PM   Specimen: Nasopharyngeal Swab  Result Value Ref Range Status   SARS Coronavirus 2 by RT PCR POSITIVE (A) NEGATIVE Final    Comment: RESULT CALLED TO, READ BACK BY AND VERIFIED WITH: BINGHAM,S. RN @1822  ON 01.27.2021 BY COHEN,K (NOTE) SARS-CoV-2 target nucleic acids are DETECTED. SARS-CoV-2 RNA is generally detectable in upper respiratory specimens  during the acute phase of infection. Positive results are indicative of the presence of the identified virus, but do not rule out bacterial infection or co-infection with other pathogens not detected by the test. Clinical correlation with patient history and other diagnostic information is necessary to determine patient infection status. The expected result is Negative. Fact Sheet for  Patients:   PinkCheek.be Fact Sheet for Healthcare Providers: GravelBags.it This test is not yet approved or cleared by the [place] FDA and  has been authorized for detection and/or diagnosis of SARS-CoV-2 by FDA under an Emergency Use Authorization (EUA).  This EUA will remain in effect (meaning this test can b e used) for the duration of  the COVID-19 declaration under Section 564(b)(1) of the Act, 21 U.S.C. section 360bbb-3(b)(1), unless the authorization is terminated or revoked sooner.    Influenza A by PCR NEGATIVE NEGATIVE Final   Influenza B by PCR NEGATIVE NEGATIVE Final    Comment: (NOTE) The Xpert Xpress SARS-CoV-2/FLU/RSV assay is intended as an aid in  the diagnosis of influenza from Nasopharyngeal swab specimens and  should not be used as a sole basis for treatment. Nasal washings and  aspirates are unacceptable for Xpert Xpress SARS-CoV-2/FLU/RSV  testing. Fact Sheet for Patients: PinkCheek.be Fact Sheet for Healthcare Providers: GravelBags.it This test is not yet approved or cleared by the [place] FDA and  has been authorized for detection and/or diagnosis of SARS-CoV-2 by  FDA under an Emergency Use Authorization (EUA). This EUA will remain  in effect (meaning this test can be used) for the duration of the  Covid-19 declaration under Section 564(b)(1) of the Act, 21  U.S.C. section 360bbb-3(b)(1), unless the authorization is  terminated or revoked. Performed at [facility], [place] [address] [place] [address]      Labs: BNP (last 3 results) No results for input(s): BNP in the last 8760 hours. Basic Metabolic Panel: Recent Labs  Lab 11/21/19 1147 11/22/19 0301 11/23/19 0319  NA 140 140 139  K 3.5 3.4* 3.3*  CL 103 111 113*  CO2 21* 20* 21*  GLUCOSE 145* 78 93  BUN 42* 30* 18  CREATININE 1.41* 1.03* 0.75   CALCIUM 9.5 8.3* 8.1*   Liver Function Tests: Recent Labs  Lab 11/21/19 1147 11/23/19 0319  AST 32 22  ALT 18 14  ALKPHOS 48 34*  BILITOT 1.2 0.8  PROT 7.4 4.9*  ALBUMIN 3.5 2.3*   No results for input(s): LIPASE, AMYLASE in the last 168 hours. No results for input(s): AMMONIA in the last 168 hours. CBC: Recent Labs  Lab 11/21/19 1147 11/22/19 0301 11/23/19 0319  WBC 11.7* 7.2 6.6  NEUTROABS 9.8*  --   --   HGB 14.5 11.1* 9.6*  HCT 44.4 35.7* 30.6*  MCV 94.1 97.3 96.5  PLT PLATELET CLUMPS NOTED ON SMEAR, UNABLE TO ESTIMATE 105* 99*   Cardiac Enzymes: Recent Labs  Lab 11/21/19 1147  CKTOTAL 66   BNP: Invalid input(s): POCBNP CBG: Recent Labs  Lab 11/21/19 1116  GLUCAP 120*   D-Dimer Recent Labs    11/21/19 1147  DDIMER 1.75*   Hgb A1c No results for input(s): HGBA1C in the last 72 hours. Lipid Profile No results for input(s): CHOL, HDL, LDLCALC, TRIG, CHOLHDL, LDLDIRECT in the last 72 hours. Thyroid function studies No results for input(s): TSH, T4TOTAL, T3FREE, THYROIDAB in the last 72 hours.  Invalid input(s): FREET3 Anemia work up No results for input(s): VITAMINB12, FOLATE, FERRITIN, TIBC, IRON, RETICCTPCT in the last 72 hours. Urinalysis    Component Value Date/Time   COLORURINE YELLOW 11/21/2019 1818   APPEARANCEUR CLOUDY (A) 11/21/2019 1818   LABSPEC 1.026 11/21/2019 1818   LABSPEC 1.015 05/31/2019 1059   PHURINE 5.0 11/21/2019 1818   GLUCOSEU NEGATIVE 11/21/2019 1818   HGBUR LARGE (A) 11/21/2019 1818   BILIRUBINUR NEGATIVE  11/21/2019 1818   BILIRUBINUR negative 05/31/2019 1059   BILIRUBINUR n 10/29/2016 0927   KETONESUR 5 (A) 11/21/2019 1818   PROTEINUR 100 (A) 11/21/2019 1818   UROBILINOGEN negative 10/29/2016 0927   NITRITE NEGATIVE 11/21/2019 1818   LEUKOCYTESUR LARGE (A) 11/21/2019 1818   Sepsis Labs Invalid input(s): PROCALCITONIN,  WBC,  LACTICIDVEN Microbiology Recent Results (from the past 240 hour(s))  Respiratory  Panel by RT PCR (Flu A&B, Covid) - Nasopharyngeal Swab     Status: Abnormal   Collection Time: 11/21/19  4:51 PM   Specimen: Nasopharyngeal Swab  Result Value Ref Range Status   SARS Coronavirus 2 by RT PCR POSITIVE (A) NEGATIVE Final    Comment: RESULT CALLED TO, READ BACK BY AND VERIFIED WITH: BINGHAM,S. RN @1822  ON 01.27.2021 BY COHEN,K (NOTE) SARS-CoV-2 target nucleic acids are DETECTED. SARS-CoV-2 RNA is generally detectable in upper respiratory specimens  during the acute phase of infection. Positive results are indicative of the presence of the identified virus, but do not rule out bacterial infection or co-infection with other pathogens not detected by the test. Clinical correlation with patient history and other diagnostic information is necessary to determine patient infection status. The expected result is Negative. Fact Sheet for Patients:  PinkCheek.be Fact Sheet for Healthcare Providers: GravelBags.it This test is not yet approved or cleared by the [place] FDA and  has been authorized for detection and/or diagnosis of SARS-CoV-2 by FDA under an Emergency Use Authorization (EUA).  This EUA will remain in effect (meaning this test can b e used) for the duration of  the COVID-19 declaration under Section 564(b)(1) of the Act, 21 U.S.C. section 360bbb-3(b)(1), unless the authorization is terminated or revoked sooner.    Influenza A by PCR NEGATIVE NEGATIVE Final   Influenza B by PCR NEGATIVE NEGATIVE Final    Comment: (NOTE) The Xpert Xpress SARS-CoV-2/FLU/RSV assay is intended as an aid in  the diagnosis of influenza from Nasopharyngeal swab specimens and  should not be used as a sole basis for treatment. Nasal washings and  aspirates are unacceptable for Xpert Xpress SARS-CoV-2/FLU/RSV  testing. Fact Sheet for Patients: PinkCheek.be Fact Sheet for Healthcare  Providers: GravelBags.it This test is not yet approved or cleared by the [place] FDA and  has been authorized for detection and/or diagnosis of SARS-CoV-2 by  FDA under an Emergency Use Authorization (EUA). This EUA will remain  in effect (meaning this test can be used) for the duration of the  Covid-19 declaration under Section 564(b)(1) of the Act, 21  U.S.C. section 360bbb-3(b)(1), unless the authorization is  terminated or revoked. Performed at [facility], [place] [address], [place], [place] [address]    Time spent: 30 min  SIGNED:   Marylu Lund, MD  [facility] 11/23/2019, 9:45 AM  If 7PM-7AM, please contact night-coverage

## 2019-11-26 ENCOUNTER — Telehealth: Payer: Self-pay

## 2019-11-26 NOTE — Telephone Encounter (Signed)
I called the pt. To her scheduled for a hospital f/u for recent hospital visit for weakness, syncope, from positive covid result on 11/04/19. The pts. Medication were gone over and reconciled. The pt. Is scheduled on 11/29/19 for hospital f/u.

## 2019-11-28 ENCOUNTER — Encounter: Payer: Self-pay | Admitting: Family Medicine

## 2019-11-29 ENCOUNTER — Ambulatory Visit (INDEPENDENT_AMBULATORY_CARE_PROVIDER_SITE_OTHER): Payer: PPO | Admitting: Family Medicine

## 2019-11-29 ENCOUNTER — Other Ambulatory Visit: Payer: Self-pay

## 2019-11-29 ENCOUNTER — Encounter: Payer: Self-pay | Admitting: Family Medicine

## 2019-11-29 VITALS — BP 118/70 | HR 105 | Temp 98.0°F | Wt 154.4 lb

## 2019-11-29 DIAGNOSIS — Z8616 Personal history of COVID-19: Secondary | ICD-10-CM | POA: Diagnosis not present

## 2019-11-29 DIAGNOSIS — Z8639 Personal history of other endocrine, nutritional and metabolic disease: Secondary | ICD-10-CM | POA: Diagnosis not present

## 2019-11-29 DIAGNOSIS — R5383 Other fatigue: Secondary | ICD-10-CM

## 2019-11-29 DIAGNOSIS — R296 Repeated falls: Secondary | ICD-10-CM | POA: Insufficient documentation

## 2019-11-29 DIAGNOSIS — I10 Essential (primary) hypertension: Secondary | ICD-10-CM | POA: Diagnosis not present

## 2019-11-29 NOTE — Progress Notes (Signed)
   Subjective:    Patient ID: Allison [name], female    DOB: 1941/01/02, 79 y.o.   MRN: [id number]  HPI She is here for posthospitalization follow-up.  She was diagnosed with Covid on January 10.  On the 19th she went ahead and got the shot.  She then had difficulty with fatigue and falls and was admitted to the hospital on the 27th through the 29th.  The hospital emergency room and discharge summary as well as blood work was evaluated.  She did have some slight abnormalities in her blood studies.  Since leaving the hospital she has had no more falls.  Bars have been put up in a bathroom to help getting off the commode.  She has been maintaining her fluids.  Her son has been helping with taking care of her and her husband.  No nausea, vomiting, cough, shortness of breath, fevers.   Review of Systems     Objective:   Physical Exam Alert and in no distress. Tympanic membranes and canals are normal. Pharyngeal area is normal. Neck is supple without adenopathy or thyromegaly. Cardiac exam shows a regular sinus rhythm without murmurs or gallops. Lungs are clear to auscultation.  Ecchymosis noted around the right eye.        Assessment & Plan:  History of COVID-19  Essential hypertension  Fatigue, unspecified type - Plan: CBC with Differential/Platelet, Comprehensive metabolic panel  Multiple falls  History of dehydration - Plan: CBC with Differential/Platelet, Comprehensive metabolic panel I will do blood work to follow-up on this.  We will also have home health come out and do an assessment of the need because it does sound as if PT and OT will be needed as well as ensure that they are being taken care of in terms of food.  Recheck here in a couple of weeks.

## 2019-11-30 LAB — CBC WITH DIFFERENTIAL/PLATELET
Basophils Absolute: 0 10*3/uL (ref 0.0–0.2)
Basos: 1 %
EOS (ABSOLUTE): 0.2 10*3/uL (ref 0.0–0.4)
Eos: 4 %
Hematocrit: 31.4 % — ABNORMAL LOW (ref 34.0–46.6)
Hemoglobin: 10.9 g/dL — ABNORMAL LOW (ref 11.1–15.9)
Immature Grans (Abs): 0 10*3/uL (ref 0.0–0.1)
Immature Granulocytes: 1 %
Lymphocytes Absolute: 0.8 10*3/uL (ref 0.7–3.1)
Lymphs: 14 %
MCH: 31.1 pg (ref 26.6–33.0)
MCHC: 34.7 g/dL (ref 31.5–35.7)
MCV: 90 fL (ref 79–97)
Monocytes Absolute: 0.5 10*3/uL (ref 0.1–0.9)
Monocytes: 8 %
Neutrophils Absolute: 4.4 10*3/uL (ref 1.4–7.0)
Neutrophils: 72 %
Platelets: 275 10*3/uL (ref 150–450)
RBC: 3.51 x10E6/uL — ABNORMAL LOW (ref 3.77–5.28)
RDW: 13.7 % (ref 11.7–15.4)
WBC: 5.9 10*3/uL (ref 3.4–10.8)

## 2019-11-30 LAB — COMPREHENSIVE METABOLIC PANEL
ALT: 18 IU/L (ref 0–32)
AST: 33 IU/L (ref 0–40)
Albumin/Globulin Ratio: 1.1 — ABNORMAL LOW (ref 1.2–2.2)
Albumin: 3.1 g/dL — ABNORMAL LOW (ref 3.7–4.7)
Alkaline Phosphatase: 67 IU/L (ref 39–117)
BUN/Creatinine Ratio: 14 (ref 12–28)
BUN: 13 mg/dL (ref 8–27)
Bilirubin Total: 0.4 mg/dL (ref 0.0–1.2)
CO2: 23 mmol/L (ref 20–29)
Calcium: 9.3 mg/dL (ref 8.7–10.3)
Chloride: 100 mmol/L (ref 96–106)
Creatinine, Ser: 0.95 mg/dL (ref 0.57–1.00)
GFR calc Af Amer: 65 mL/min/{1.73_m2} (ref >59)
GFR calc non Af Amer: 57 mL/min/{1.73_m2} — ABNORMAL LOW (ref >59)
Globulin, Total: 2.9 g/dL (ref 1.5–4.5)
Glucose: 93 mg/dL (ref 65–99)
Potassium: 4.3 mmol/L (ref 3.5–5.2)
Sodium: 138 mmol/L (ref 134–144)
Total Protein: 6 g/dL (ref 6.0–8.5)

## 2019-12-02 DIAGNOSIS — U071 COVID-19: Secondary | ICD-10-CM | POA: Diagnosis not present

## 2019-12-02 DIAGNOSIS — R296 Repeated falls: Secondary | ICD-10-CM | POA: Diagnosis not present

## 2019-12-02 DIAGNOSIS — I951 Orthostatic hypotension: Secondary | ICD-10-CM | POA: Diagnosis not present

## 2019-12-02 DIAGNOSIS — R279 Unspecified lack of coordination: Secondary | ICD-10-CM | POA: Diagnosis not present

## 2019-12-02 DIAGNOSIS — R531 Weakness: Secondary | ICD-10-CM | POA: Diagnosis not present

## 2019-12-04 ENCOUNTER — Ambulatory Visit: Payer: PPO

## 2019-12-20 ENCOUNTER — Telehealth: Payer: Self-pay | Admitting: Family Medicine

## 2019-12-20 NOTE — Telephone Encounter (Signed)
pts son called back and states that he needs the FMLA paper work leave end date  Changed it says 12/22/2019 he wants it to be marked out and say march the 9th 2021. He says it can just be marked out and have your initials  on it. He says she is still having a little trouble at home eating and doing every day to day stuff, I put the paperwork in your folder please fax it when done to [phone].

## 2019-12-21 ENCOUNTER — Encounter: Payer: Self-pay | Admitting: Family Medicine

## 2019-12-21 NOTE — Telephone Encounter (Signed)
Faxed fmla per pt son and emailed paperwork per son and made copy for office

## 2019-12-25 DIAGNOSIS — U071 COVID-19: Secondary | ICD-10-CM | POA: Diagnosis not present

## 2019-12-25 DIAGNOSIS — R279 Unspecified lack of coordination: Secondary | ICD-10-CM | POA: Diagnosis not present

## 2019-12-25 DIAGNOSIS — R296 Repeated falls: Secondary | ICD-10-CM | POA: Diagnosis not present

## 2019-12-25 DIAGNOSIS — I951 Orthostatic hypotension: Secondary | ICD-10-CM | POA: Diagnosis not present

## 2019-12-25 DIAGNOSIS — R531 Weakness: Secondary | ICD-10-CM | POA: Diagnosis not present

## 2019-12-29 ENCOUNTER — Encounter: Payer: Self-pay | Admitting: Family Medicine

## 2019-12-31 ENCOUNTER — Telehealth: Payer: Self-pay | Admitting: Family Medicine

## 2019-12-31 NOTE — Telephone Encounter (Signed)
Call son and was advised to fax from. He was informed that [employer] will not extend FMLA any more.

## 2019-12-31 NOTE — Telephone Encounter (Signed)
Pt's son called and states that he needs to extend his FMLA to care for his mother. He would like to exten until next Wednesday 01/09/2020. I am sending back FMLA form back. He states we can update this one. He can be reached at [phone].

## 2020-01-01 ENCOUNTER — Ambulatory Visit: Payer: PPO | Attending: Internal Medicine

## 2020-01-01 DIAGNOSIS — Z23 Encounter for immunization: Secondary | ICD-10-CM

## 2020-01-01 NOTE — Progress Notes (Signed)
   Covid-19 Vaccination Clinic  Name:  Allison [name]    MRN: [id number] DOB: 1941-07-19  01/01/2020  [name] was observed post Covid-19 immunization for 15 minutes without incident. She was provided with Vaccine Information Sheet and instruction to access the V-Safe system.   [name] was instructed to call 911 with any severe reactions post vaccine: Marland Kitchen Difficulty breathing  . Swelling of face and throat  . A fast heartbeat  . A bad rash all over body  . Dizziness and weakness   Immunizations Administered    Name Date Dose VIS Date Route   Pfizer COVID-19 Vaccine 01/01/2020  6:09 PM 0.3 mL 10/05/2019 Intramuscular   Manufacturer: [place]   Lot: [id number]   [place]: [id number]

## 2020-01-05 ENCOUNTER — Ambulatory Visit: Payer: PPO

## 2020-01-28 ENCOUNTER — Ambulatory Visit (INDEPENDENT_AMBULATORY_CARE_PROVIDER_SITE_OTHER): Payer: PPO | Admitting: Family Medicine

## 2020-01-28 ENCOUNTER — Encounter: Payer: Self-pay | Admitting: Family Medicine

## 2020-01-28 ENCOUNTER — Other Ambulatory Visit: Payer: Self-pay

## 2020-01-28 VITALS — BP 148/78 | HR 80 | Temp 97.7°F | Wt 157.8 lb

## 2020-01-28 DIAGNOSIS — Z8639 Personal history of other endocrine, nutritional and metabolic disease: Secondary | ICD-10-CM | POA: Diagnosis not present

## 2020-01-28 DIAGNOSIS — Z8616 Personal history of COVID-19: Secondary | ICD-10-CM | POA: Diagnosis not present

## 2020-01-28 DIAGNOSIS — I10 Essential (primary) hypertension: Secondary | ICD-10-CM | POA: Diagnosis not present

## 2020-01-28 DIAGNOSIS — E039 Hypothyroidism, unspecified: Secondary | ICD-10-CM | POA: Diagnosis not present

## 2020-01-28 MED ORDER — THYROID 60 MG PO TABS: 60.0000 mg | ORAL_TABLET | Freq: Every day | ORAL | 0 refills | Status: DC

## 2020-01-28 NOTE — Progress Notes (Signed)
   Subjective:    Patient ID: Allison [name], female    DOB: 1941/07/29, 79 y.o.   MRN: [id number]  HPI She is here for a recheck.  She was admitted to the hospital January 27 for 2 days.  Prior to admission she was diagnosed with Covid on January 10 she had difficulty mainly with fatigue after that and did fall on several occasions before going to the emergency room.  She was seen there on the 27th.  Evaluation in the emergency room was reviewed.  She was orthostatic and given IV fluids.  She responded well to IV hydration and was sent home.  She did get physical therapy as well as OT.  She responded well to that.  Presently she is having no fever, chills, cough, congestion, fatigue.  She does note some short-term memory issues. She also was switched to Armour Thyroid at her request.  She would like to continue on this for another 3 months.  She does have underlying hypertension and is doing well on labetalol. Review of Systems     Objective:   Physical Exam Alert and in no distress. Tympanic membranes and canals are normal. Pharyngeal area is normal. Neck is supple without adenopathy or thyromegaly. Cardiac exam shows a regular sinus rhythm without murmurs or gallops. Lungs are clear to auscultation. The hospital discharge summary including blood work and chest x-ray was independently reviewed by me.  There were abnormal electrolytes.  X-ray did show interstitial changes.      Assessment & Plan:  Essential hypertension - Plan: CBC with Differential/Platelet, Comprehensive metabolic panel  Hypothyroidism, unspecified type - Plan: thyroid (ARMOUR THYROID) 60 MG tablet  History of COVID-19 - Plan: CBC with Differential/Platelet, Comprehensive metabolic panel  History of dehydration - Plan: CBC with Differential/Platelet, Comprehensive metabolic panel She will return here in roughly 3 months for recheck on her Synthroid to see if she notes any difference in her overall fatigue. She does  think that she is slowly getting better from her memory issues otherwise no further intervention needed.

## 2020-01-29 LAB — COMPREHENSIVE METABOLIC PANEL
ALT: 6 IU/L (ref 0–32)
AST: 22 IU/L (ref 0–40)
Albumin/Globulin Ratio: 1.4 (ref 1.2–2.2)
Albumin: 3.8 g/dL (ref 3.7–4.7)
Alkaline Phosphatase: 55 IU/L (ref 39–117)
BUN/Creatinine Ratio: 11 — ABNORMAL LOW (ref 12–28)
BUN: 12 mg/dL (ref 8–27)
Bilirubin Total: 0.4 mg/dL (ref 0.0–1.2)
CO2: 25 mmol/L (ref 20–29)
Calcium: 9.9 mg/dL (ref 8.7–10.3)
Chloride: 108 mmol/L — ABNORMAL HIGH (ref 96–106)
Creatinine, Ser: 1.06 mg/dL — ABNORMAL HIGH (ref 0.57–1.00)
GFR calc Af Amer: 57 mL/min/{1.73_m2} — ABNORMAL LOW (ref >59)
GFR calc non Af Amer: 50 mL/min/{1.73_m2} — ABNORMAL LOW (ref >59)
Globulin, Total: 2.7 g/dL (ref 1.5–4.5)
Glucose: 81 mg/dL (ref 65–99)
Potassium: 4.9 mmol/L (ref 3.5–5.2)
Sodium: 144 mmol/L (ref 134–144)
Total Protein: 6.5 g/dL (ref 6.0–8.5)

## 2020-01-29 LAB — CBC WITH DIFFERENTIAL/PLATELET
Basophils Absolute: 0.1 10*3/uL (ref 0.0–0.2)
Basos: 1 %
EOS (ABSOLUTE): 0.2 10*3/uL (ref 0.0–0.4)
Eos: 3 %
Hematocrit: 32.3 % — ABNORMAL LOW (ref 34.0–46.6)
Hemoglobin: 10.7 g/dL — ABNORMAL LOW (ref 11.1–15.9)
Immature Grans (Abs): 0 10*3/uL (ref 0.0–0.1)
Immature Granulocytes: 0 %
Lymphocytes Absolute: 1.8 10*3/uL (ref 0.7–3.1)
Lymphs: 29 %
MCH: 31 pg (ref 26.6–33.0)
MCHC: 33.1 g/dL (ref 31.5–35.7)
MCV: 94 fL (ref 79–97)
Monocytes Absolute: 0.5 10*3/uL (ref 0.1–0.9)
Monocytes: 8 %
Neutrophils Absolute: 3.7 10*3/uL (ref 1.4–7.0)
Neutrophils: 59 %
Platelets: 265 10*3/uL (ref 150–450)
RBC: 3.45 x10E6/uL — ABNORMAL LOW (ref 3.77–5.28)
RDW: 13.8 % (ref 11.7–15.4)
WBC: 6.2 10*3/uL (ref 3.4–10.8)

## 2020-02-15 DIAGNOSIS — N3020 Other chronic cystitis without hematuria: Secondary | ICD-10-CM | POA: Diagnosis not present

## 2020-02-19 DIAGNOSIS — N130 Hydronephrosis with ureteropelvic junction obstruction: Secondary | ICD-10-CM | POA: Diagnosis not present

## 2020-02-19 DIAGNOSIS — N3020 Other chronic cystitis without hematuria: Secondary | ICD-10-CM | POA: Diagnosis not present

## 2020-04-07 ENCOUNTER — Telehealth: Payer: Self-pay | Admitting: Family Medicine

## 2020-04-07 NOTE — Telephone Encounter (Signed)
Received a request for armour thyroid 60 mg tab refill pharmacy   [facility] ([place], [place]  [place], [place] [address]  Phone:  [phone] Fax:  [phone]

## 2020-04-11 DIAGNOSIS — H524 Presbyopia: Secondary | ICD-10-CM | POA: Diagnosis not present

## 2020-04-11 DIAGNOSIS — H26491 Other secondary cataract, right eye: Secondary | ICD-10-CM | POA: Diagnosis not present

## 2020-04-11 DIAGNOSIS — H353132 Nonexudative age-related macular degeneration, bilateral, intermediate dry stage: Secondary | ICD-10-CM | POA: Diagnosis not present

## 2020-04-11 DIAGNOSIS — H52223 Regular astigmatism, bilateral: Secondary | ICD-10-CM | POA: Diagnosis not present

## 2020-05-22 ENCOUNTER — Encounter: Payer: Self-pay | Admitting: Family Medicine

## 2020-05-22 ENCOUNTER — Other Ambulatory Visit: Payer: Self-pay | Admitting: Family Medicine

## 2020-05-22 ENCOUNTER — Other Ambulatory Visit: Payer: Self-pay

## 2020-05-22 ENCOUNTER — Ambulatory Visit (INDEPENDENT_AMBULATORY_CARE_PROVIDER_SITE_OTHER): Payer: PPO | Admitting: Family Medicine

## 2020-05-22 VITALS — BP 140/82 | HR 76 | Temp 97.1°F | Wt 152.6 lb

## 2020-05-22 DIAGNOSIS — Z8616 Personal history of COVID-19: Secondary | ICD-10-CM | POA: Diagnosis not present

## 2020-05-22 DIAGNOSIS — R634 Abnormal weight loss: Secondary | ICD-10-CM

## 2020-05-22 DIAGNOSIS — F09 Unspecified mental disorder due to known physiological condition: Secondary | ICD-10-CM | POA: Diagnosis not present

## 2020-05-22 DIAGNOSIS — E039 Hypothyroidism, unspecified: Secondary | ICD-10-CM

## 2020-05-22 DIAGNOSIS — G479 Sleep disorder, unspecified: Secondary | ICD-10-CM | POA: Diagnosis not present

## 2020-05-22 DIAGNOSIS — D692 Other nonthrombocytopenic purpura: Secondary | ICD-10-CM

## 2020-05-22 MED ORDER — ZOLPIDEM TARTRATE 5 MG PO TABS: 5.0000 mg | ORAL_TABLET | Freq: Every evening | ORAL | 0 refills | Status: DC | PRN

## 2020-05-22 NOTE — Progress Notes (Addendum)
   Subjective:    Patient ID: Allison [name], female    DOB: 1941/08/12, 79 y.o.   MRN: [id number]  HPI She contracted Covid in January of this year and did take a long time to recover.  She is still noticing difficulty with decreased appetite, weight loss, cognitive issues with difficulty with her memory.  She has also noted some skin changes mainly on her forearms.  She states that she is also noted changes in her hair.  No fever, chills, headache.  She continues on her thyroid medication.  She has also noted difficulty with interfering with her normal sleep pattern.  She would like a refill on her Ambien.  It was last prescribed in 2018.   Review of Systems     Objective:   Physical Exam Alert and in no distress. Tympanic membranes and canals are normal. Pharyngeal area is normal. Neck is supple without adenopathy or thyromegaly. Cardiac exam shows a regular sinus rhythm without murmurs or gallops. Lungs are clear to auscultation. Both forearms show evidence of purpuric lesions.  DTRs are normal.       Assessment & Plan:  History of COVID-19  Senile purpura (HCC)  Cognitive dysfunction - Plan: CBC with Differential/Platelet, Comprehensive metabolic panel, Vitamin A56, Folate  Hypothyroidism, unspecified type - Plan: TSH  Sleep disturbance - Plan: zolpidem (AMBIEN) 5 MG tablet  Weight loss I explained the purpura is  a problem that occurs as people get older.  No therapy can be given. I then discussed the issue post Covid long hauler.  I explained that we will try to find a curable reason behind this but in general we do not have a good feel for exactly what is causing all these mainly cognitive issues that several people are having.  Over 30 minutes spent discussing all these issues with her.

## 2020-05-22 NOTE — Addendum Note (Signed)
Addended by: Denita Lung on: 05/22/2020 12:09 PM   Modules accepted: Orders

## 2020-05-26 LAB — CBC WITH DIFFERENTIAL/PLATELET
Basophils Absolute: 0.1 10*3/uL (ref 0.0–0.2)
Basos: 1 %
EOS (ABSOLUTE): 0.2 10*3/uL (ref 0.0–0.4)
Eos: 3 %
Hematocrit: 43.4 % (ref 34.0–46.6)
Hemoglobin: 13.4 g/dL (ref 11.1–15.9)
Immature Grans (Abs): 0.1 10*3/uL (ref 0.0–0.1)
Immature Granulocytes: 1 %
Lymphocytes Absolute: 1.5 10*3/uL (ref 0.7–3.1)
Lymphs: 21 %
MCH: 31.4 pg (ref 26.6–33.0)
MCHC: 30.9 g/dL — ABNORMAL LOW (ref 31.5–35.7)
MCV: 102 fL — ABNORMAL HIGH (ref 79–97)
Monocytes Absolute: 0.2 10*3/uL (ref 0.1–0.9)
Monocytes: 3 %
Neutrophils Absolute: 5.3 10*3/uL (ref 1.4–7.0)
Neutrophils: 71 %
Platelets: 133 10*3/uL — ABNORMAL LOW (ref 150–450)
RBC: 4.27 x10E6/uL (ref 3.77–5.28)
RDW: 14.5 % (ref 11.7–15.4)
WBC: 7.4 10*3/uL (ref 3.4–10.8)

## 2020-05-26 LAB — COMPREHENSIVE METABOLIC PANEL
ALT: 11 IU/L (ref 0–32)
AST: 28 IU/L (ref 0–40)
Albumin/Globulin Ratio: 1.8 (ref 1.2–2.2)
Albumin: 4.5 g/dL (ref 3.7–4.7)
Alkaline Phosphatase: 55 IU/L (ref 48–121)
BUN/Creatinine Ratio: 18 (ref 12–28)
BUN: 21 mg/dL (ref 8–27)
Bilirubin Total: 0.6 mg/dL (ref 0.0–1.2)
CO2: 22 mmol/L (ref 20–29)
Calcium: 10 mg/dL (ref 8.7–10.3)
Chloride: 105 mmol/L (ref 96–106)
Creatinine, Ser: 1.14 mg/dL — ABNORMAL HIGH (ref 0.57–1.00)
GFR calc Af Amer: 52 mL/min/{1.73_m2} — ABNORMAL LOW (ref >59)
GFR calc non Af Amer: 46 mL/min/{1.73_m2} — ABNORMAL LOW (ref >59)
Globulin, Total: 2.5 g/dL (ref 1.5–4.5)
Glucose: 98 mg/dL (ref 65–99)
Potassium: 5 mmol/L (ref 3.5–5.2)
Sodium: 143 mmol/L (ref 134–144)
Total Protein: 7 g/dL (ref 6.0–8.5)

## 2020-05-26 LAB — VITAMIN B12: Vitamin B-12: >2000 pg/mL — ABNORMAL HIGH (ref 232–1245)

## 2020-05-26 LAB — FOLATE: Folate: 8.6 ng/mL (ref >3.0)

## 2020-05-26 LAB — TSH: TSH: 4.23 u[IU]/mL (ref 0.450–4.500)

## 2020-06-04 ENCOUNTER — Telehealth: Payer: Self-pay | Admitting: Family Medicine

## 2020-06-04 MED ORDER — LEVOTHYROXINE SODIUM 75 MCG PO TABS: 75.0000 ug | ORAL_TABLET | Freq: Every day | ORAL | 3 refills | Status: DC

## 2020-06-04 NOTE — Telephone Encounter (Signed)
Pt states Levothyroxine was supposed to be refilled at [facility] from last office visit.  Please refill for 90 days

## 2020-06-05 ENCOUNTER — Telehealth: Payer: Self-pay

## 2020-06-05 NOTE — Telephone Encounter (Signed)
Pharmacy and patient has been advised. Patient has scheduled an appointment for 2 months

## 2020-06-05 NOTE — Telephone Encounter (Signed)
[facility] pharmacy called and stated manufacture has changed for levothyroxine to [place]. It use to be Sandoz. Is this change ok to still fill for patient? Please advise.   Pharmacy can be reached at [phone] JWT#[id number]

## 2020-06-05 NOTE — Telephone Encounter (Signed)
Yes but have her come in in 2 months for a TSH to make sure that the new brand is still working

## 2020-07-02 ENCOUNTER — Ambulatory Visit (INDEPENDENT_AMBULATORY_CARE_PROVIDER_SITE_OTHER): Payer: PPO

## 2020-07-02 ENCOUNTER — Ambulatory Visit: Payer: PPO | Admitting: Orthopaedic Surgery

## 2020-07-02 DIAGNOSIS — M542 Cervicalgia: Secondary | ICD-10-CM | POA: Diagnosis not present

## 2020-07-02 DIAGNOSIS — M25511 Pain in right shoulder: Secondary | ICD-10-CM

## 2020-07-02 DIAGNOSIS — G8929 Other chronic pain: Secondary | ICD-10-CM

## 2020-07-02 MED ORDER — NABUMETONE 750 MG PO TABS
750.0000 mg | ORAL_TABLET | Freq: Two times a day (BID) | ORAL | 1 refills | Status: DC | PRN
Start: 2020-07-02 — End: 2021-07-02

## 2020-07-02 MED ORDER — METHYLPREDNISOLONE ACETATE 40 MG/ML IJ SUSP
40.0000 mg | INTRAMUSCULAR | Status: AC | PRN
  Administered 2020-07-02: 40 mg via INTRA_ARTICULAR

## 2020-07-02 MED ORDER — METHYLPREDNISOLONE 4 MG PO TABS: ORAL_TABLET | ORAL | 0 refills | Status: DC

## 2020-07-02 MED ORDER — LIDOCAINE HCL 1 % IJ SOLN
3.0000 mL | INTRAMUSCULAR | Status: AC | PRN
Start: 2020-07-02 — End: 2020-07-02
  Administered 2020-07-02: 3 mL

## 2020-07-02 NOTE — Progress Notes (Signed)
Office Visit Note   Patient: Allison [name]           Date of Birth: 29-Aug-1941           MRN: [id number] Visit Date: 07/02/2020              Requested by: Denita Lung, [address] [place],  [place] [address] PCP: Denita Lung, MD   Assessment & Plan: Visit Diagnoses:  1. Neck pain   2. Chronic right shoulder pain     Plan: I spoke with her about trying a steroid injection in her right shoulder today as well as a combination of increasing her anti-inflammatories to Relafen and a steroid taper.  She agrees with this treatment plan.  I would like to see her back in only 2 weeks from now to see how she is responding to this.  If she continues to have the neck symptoms and the radicular symptoms I would recommend an MRI of the cervical spine based on her clinical exam and plain film findings.  All questions and concerns were answered and addressed.  Follow-Up Instructions: Return in about 2 weeks (around 07/16/2020).   Orders:  Orders Placed This Encounter  Procedures  . Large Joint Inj  . Large Joint Inj  . XR Cervical Spine 2 or 3 views  . XR Shoulder Right   Meds ordered this encounter  Medications  . methylPREDNISolone (MEDROL) 4 MG tablet    Sig: Medrol dose pack. Take as instructed    Dispense:  21 tablet    Refill:  0  . nabumetone (RELAFEN) 750 MG tablet    Sig: Take 1 tablet (750 mg total) by mouth 2 (two) times daily as needed.    Dispense:  60 tablet    Refill:  1      Procedures: Large Joint Inj: R subacromial bursa on 07/02/2020 9:51 AM Indications: pain and diagnostic evaluation Details: 22 G 1.5 in needle  Arthrogram: No  Medications: 3 mL lidocaine 1 %; 40 mg methylPREDNISolone acetate 40 MG/ML Outcome: tolerated well, no immediate complications Procedure, treatment alternatives, risks and benefits explained, specific risks discussed. Consent was given by the patient. Immediately prior to procedure a time out was called to verify the  correct patient, procedure, equipment, support staff and site/side marked as required. Patient was prepped and draped in the usual sterile fashion.       Clinical Data: No additional findings.   Subjective: Chief Complaint  Patient presents with  . Neck - Pain  . Right Shoulder - Pain  Patient is a very pleasant 79 year old female who comes in for evaluation treatment of chronic right-sided neck and shoulder pain.  She said she was hospitalized with Covid back in 12-12-22 and had passed out a few times.  Since then she has had significant pain more so in her neck that radiates around the shoulder girdle on the right side.  She is right-hand dominant.  She has tried just over-the-counter anti-inflammatories.  It does wake her up at night and is becoming quite painful to her.  She has tried activity modification and rest and things seem to just be slowly getting worse.  She denies any numbness and tingling in her hand and does report some slight weakness in the right upper extremity.  HPI  Review of Systems She currently denies any headache, chest pain, shortness of breath, fever, chills, nausea, vomiting  Objective: Vital Signs: There were no vitals taken for this visit.  Physical Exam She is alert and orient x3 and in no acute distress Ortho Exam Examination of her right shoulder shows it moves smoothly and fluidly with a little bit of the pain.  She definitely shows some weakness in her triceps on the right side.  She has pain in her neck at the midline and to the right side.  There is some stiffness with rotation to the right side and a positive Spurling sign to the right side.  She does have good grip and pinch strength on the right side. Specialty Comments:  No specialty comments available.  Imaging: XR Cervical Spine 2 or 3 views  Result Date: 07/02/2020 2 views of the cervical spine show significant degenerative disc disease between C5-C6 and C6-C7.  XR Shoulder  Right  Result Date: 07/02/2020 3 views of the right shoulder show no acute findings.  There is mild to moderate AC joint arthritic changes.  The shoulder is well located.    PMFS History: Patient Active Problem List   Diagnosis Date Noted  . Multiple falls 11/29/2019  . Generalized weakness 11/21/2019  . Orthostatic hypotension 11/21/2019  . Ureteral stricture 09/07/2019  . Osteopenia 08/28/2019  . Spinal stenosis of lumbar region with neurogenic claudication 03/11/2017  . History of kidney stones 12/04/2015  . Arthritis 12/04/2015  . Hypothyroidism 12/04/2015  . HYPERCHOLESTEROLEMIA 08/05/2009  . Essential hypertension 08/05/2009  . GERD 08/05/2009   Past Medical History:  Diagnosis Date  . Arthritis    lower back  . Former smoker   . GERD (gastroesophageal reflux disease)   . History of appendicitis   . History of blood transfusion 1960   after delivery of son  . History of cataract   . History of kidney stones 1999   lithrotripsy  . Hypercholesterolemia   . Hypertension   . Hypothyroid   . Iron deficiency anemia   . Macular degeneration    bilateral  . Pinched nerve    back  . Seasonal allergies    RHINITIS  . Tachycardia   . Wears glasses    reading    Family History  Problem Relation Age of Onset  . Arthritis Mother   . Diabetes Mother   . Arthritis Father   . Diabetes Father   . Hyperlipidemia Father   . Colon cancer Brother 36  . Colon polyps Brother 36  . Cancer Paternal Grandmother   . Heart failure Sister   . Hyperlipidemia Sister   . Rectal cancer Neg Hx   . Stomach cancer Neg Hx   . Esophageal cancer Neg Hx     Past Surgical History:  Procedure Laterality Date  . ABDOMINAL HYSTERECTOMY    . APPENDECTOMY    . cataract surgery Bilateral 03/2014   polyps  . COLONOSCOPY  02/2010  . CYSTOSCOPY W/ URETERAL STENT PLACEMENT Right 09/07/2019   Procedure: CYSTOSCOPY WITH STENT REPLACEMENT WITH RETROGRADES;  Surgeon: Alexis Frock, MD;   Location: [facility];  Service: Urology;  Laterality: Right;  . CYSTOSCOPY/RETROGRADE/URETEROSCOPY Bilateral 07/18/2019   Procedure: CYSTOSCOPY/RETROGRADE/RIGHT URETEROSCOPY/BLADDER BIOPSY/STENT PLACEMENT/RIGHT URETERAL MASS BIOPSY;  Surgeon: Alexis Frock, MD;  Location: [facility];  Service: Urology;  Laterality: Bilateral;  1 HR   Social History   Occupational History  . Not on file  Tobacco Use  . Smoking status: Former Smoker    Packs/day: 1.00    Years: 20.00    Pack years: 20.00    Types: Cigarettes    Quit date: 10/24/1993  Years since quitting: 26.7  . Smokeless tobacco: Never Used  Vaping Use  . Vaping Use: Never used  Substance and Sexual Activity  . Alcohol use: Yes    Alcohol/week: 5.0 standard drinks    Types: 5 Glasses of wine per week    Comment: 4-5 glasses of wine per week.   . Drug use: No  . Sexual activity: Yes    Birth control/protection: Post-menopausal, Surgical

## 2020-07-16 ENCOUNTER — Ambulatory Visit: Payer: PPO | Admitting: Orthopaedic Surgery

## 2020-07-16 ENCOUNTER — Encounter: Payer: Self-pay | Admitting: Orthopaedic Surgery

## 2020-07-16 DIAGNOSIS — M542 Cervicalgia: Secondary | ICD-10-CM | POA: Diagnosis not present

## 2020-07-16 DIAGNOSIS — M25511 Pain in right shoulder: Secondary | ICD-10-CM | POA: Diagnosis not present

## 2020-07-16 DIAGNOSIS — G8929 Other chronic pain: Secondary | ICD-10-CM

## 2020-07-16 NOTE — Progress Notes (Signed)
The patient comes in today for follow-up 2 weeks after I provide a steroid injection in her right shoulder.  She is 79 years old and very active.  She states that she is feeling much better overall.  She is not any pain at all.  Her only complaint is lifting her head off her pillow at night which is weakness in the neck.  I did talk about the possibility of physical therapy for her cervical spine but she says she does not really needed.  She feels good overall.  On exam she does have good range of motion of her cervical spine and great range of motion of both shoulders.  She seems to be pain-free today on her upper extremity exam and no significant weakness.  Since she is doing so well, follow-up can be as needed.  All questions and concerns were answered and addressed.  If things worsen in any way she should let us know.

## 2020-08-05 ENCOUNTER — Ambulatory Visit (INDEPENDENT_AMBULATORY_CARE_PROVIDER_SITE_OTHER): Payer: PPO | Admitting: Family Medicine

## 2020-08-05 ENCOUNTER — Other Ambulatory Visit: Payer: Self-pay

## 2020-08-05 ENCOUNTER — Encounter: Payer: Self-pay | Admitting: Family Medicine

## 2020-08-05 VITALS — BP 122/76 | HR 74 | Temp 98.5°F | Wt 152.4 lb

## 2020-08-05 DIAGNOSIS — E7800 Pure hypercholesterolemia, unspecified: Secondary | ICD-10-CM

## 2020-08-05 DIAGNOSIS — E039 Hypothyroidism, unspecified: Secondary | ICD-10-CM | POA: Diagnosis not present

## 2020-08-05 MED ORDER — SIMVASTATIN 40 MG PO TABS: 40.0000 mg | ORAL_TABLET | Freq: Every day | ORAL | 3 refills | Status: DC

## 2020-08-05 NOTE — Progress Notes (Signed)
   Subjective:    Patient ID: Allison [name], female    DOB: 23-Jan-1941, 79 y.o.   MRN: [id number]  HPI She is here for recheck on her thyroid.  She was switched back to his Synthroid from Armour Thyroid and essentially says she cannot tell the difference.  She also needs a refill on her simvastatin.  She has not had blood work drawn on that recently. Also she is up-to-date on all of her shots including Covid and flu.  Review of Systems     Objective:   Physical Exam Alert and in no distress otherwise not examined       Assessment & Plan:  Hypothyroidism, unspecified type - Plan: TSH  HYPERCHOLESTEROLEMIA - Plan: simvastatin (ZOCOR) 40 MG tablet, Lipid panel

## 2020-08-06 LAB — LIPID PANEL
Chol/HDL Ratio: 3.6 ratio (ref 0.0–4.4)
Cholesterol, Total: 174 mg/dL (ref 100–199)
HDL: 49 mg/dL (ref >39)
LDL Chol Calc (NIH): 102 mg/dL — ABNORMAL HIGH (ref 0–99)
Triglycerides: 132 mg/dL (ref 0–149)
VLDL Cholesterol Cal: 23 mg/dL (ref 5–40)

## 2020-08-06 LAB — TSH: TSH: 3.97 u[IU]/mL (ref 0.450–4.500)

## 2020-08-18 DIAGNOSIS — N3020 Other chronic cystitis without hematuria: Secondary | ICD-10-CM | POA: Diagnosis not present

## 2020-08-18 DIAGNOSIS — N130 Hydronephrosis with ureteropelvic junction obstruction: Secondary | ICD-10-CM | POA: Diagnosis not present

## 2020-08-22 ENCOUNTER — Telehealth: Payer: Self-pay

## 2020-08-22 MED ORDER — TIZANIDINE HCL 4 MG PO TABS: 4.0000 mg | ORAL_TABLET | Freq: Three times a day (TID) | ORAL | 0 refills | Status: DC | PRN

## 2020-08-22 MED ORDER — METHYLPREDNISOLONE 4 MG PO TABS: ORAL_TABLET | ORAL | 0 refills | Status: DC

## 2020-08-22 NOTE — Telephone Encounter (Signed)
I sent in a steroid taper and a muscle relaxer.

## 2020-08-22 NOTE — Telephone Encounter (Signed)
Patient called she sated her neck is in pain again and she cant move her head she is requesting rx for pain medication or steroid Call back:[phone]

## 2020-08-22 NOTE — Addendum Note (Signed)
Addended by: Jean Rosenthal on: 08/22/2020 03:46 PM   Modules accepted: Orders

## 2020-08-29 ENCOUNTER — Other Ambulatory Visit: Payer: Self-pay | Admitting: Orthopaedic Surgery

## 2020-09-07 ENCOUNTER — Other Ambulatory Visit: Payer: Self-pay | Admitting: Orthopaedic Surgery

## 2020-09-08 DIAGNOSIS — Z1231 Encounter for screening mammogram for malignant neoplasm of breast: Secondary | ICD-10-CM | POA: Diagnosis not present

## 2020-09-08 LAB — HM MAMMOGRAPHY

## 2020-09-15 ENCOUNTER — Other Ambulatory Visit: Payer: Self-pay | Admitting: Orthopaedic Surgery

## 2020-09-15 NOTE — Telephone Encounter (Signed)
Please advise 

## 2020-09-24 ENCOUNTER — Other Ambulatory Visit: Payer: Self-pay | Admitting: Orthopaedic Surgery

## 2020-09-24 NOTE — Telephone Encounter (Signed)
Ok to refill 

## 2020-09-25 ENCOUNTER — Encounter: Payer: Self-pay | Admitting: Family Medicine

## 2020-11-18 ENCOUNTER — Telehealth: Payer: Self-pay

## 2020-11-18 ENCOUNTER — Other Ambulatory Visit: Payer: Self-pay

## 2020-11-18 DIAGNOSIS — K219 Gastro-esophageal reflux disease without esophagitis: Secondary | ICD-10-CM

## 2020-11-18 MED ORDER — OMEPRAZOLE 20 MG PO CPDR: 20.0000 mg | DELAYED_RELEASE_CAPSULE | Freq: Every day | ORAL | 3 refills | Status: DC

## 2020-11-18 NOTE — Telephone Encounter (Signed)
Pt needs refill Omeprazole for 90 days to [facility]

## 2020-11-18 NOTE — Telephone Encounter (Signed)
Done K[facility] 

## 2020-11-22 NOTE — Telephone Encounter (Signed)
Refilled

## 2021-03-19 ENCOUNTER — Other Ambulatory Visit: Payer: Self-pay

## 2021-03-19 ENCOUNTER — Ambulatory Visit: Payer: PPO | Admitting: Physician Assistant

## 2021-03-19 ENCOUNTER — Encounter: Payer: Self-pay | Admitting: Physician Assistant

## 2021-03-19 DIAGNOSIS — M7541 Impingement syndrome of right shoulder: Secondary | ICD-10-CM | POA: Diagnosis not present

## 2021-03-19 MED ORDER — METHYLPREDNISOLONE ACETATE 40 MG/ML IJ SUSP
40.0000 mg | INTRAMUSCULAR | Status: AC | PRN
  Administered 2021-03-19: 40 mg via INTRA_ARTICULAR

## 2021-03-19 MED ORDER — LIDOCAINE HCL 1 % IJ SOLN
3.0000 mL | INTRAMUSCULAR | Status: AC | PRN
  Administered 2021-03-19: 3 mL

## 2021-03-19 NOTE — Progress Notes (Signed)
Office Visit Note   Patient: Allison [name]           Date of Birth: Nov 30, 1940           MRN: [id number] Visit Date: 03/19/2021              Requested by: Allison Lung, MD Allison [name] [id number] PCP: Allison Lung, MD   Assessment & Plan: Visit Diagnoses:  1. Impingement syndrome of right shoulder     Plan:  Reviewed patient's cervical spine films with her which showed degenerative changes at C5-C6 and C6-C7.  Discussed with her that would not recommend any work-up of her cervical spine dated that she is having no radicular symptoms and having no pain in her neck.  We will see how she does with the subacromial injection in the right shoulder.  She understands she needs to wait at least 3 months between injections.  Questions encouraged and answered at length  Follow-Up Instructions: Return if symptoms worsen or fail to improve.   Orders:  Orders Placed This Encounter  Procedures  . Large Joint Inj   No orders of the defined types were placed in this encounter.     Procedures: Large Joint Inj: R subacromial bursa on 03/19/2021 5:29 PM Indications: pain Details: 22 G 1.5 in needle, superior approach  Arthrogram: No  Medications: 3 mL lidocaine 1 %; 40 mg methylPREDNISolone acetate 40 MG/ML Outcome: tolerated well, no immediate complications Procedure, treatment alternatives, risks and benefits explained, specific risks discussed. Consent was given by the patient. Immediately prior to procedure a time out was called to verify the correct patient, procedure, equipment, support staff and site/side marked as required. Patient was prepped and draped in the usual sterile fashion.       Clinical Data: No additional findings.   Subjective: Chief Complaint  Patient presents with  . Right Shoulder - Pain    HPI Allison [name] comes in today with right shoulder pain.  She was last seen September 2021 and had a subacromial injection did well  with the shoulder until about 2 to 3 weeks ago.  She has chronic right shoulder pain.  She is also having grinding in her neck and is wondering what is causing this.  She denies any radicular symptoms down either arm.  She has had no new injuries.  She has pain with range of motion of the shoulder has difficulty with overhead activities but no pain with range of motion of the neck. Review of Systems No fevers, chills, or recent vaccines.  Objective: Vital Signs: There were no vitals taken for this visit.  Physical Exam Pulmonary:     Effort: Pulmonary effort is normal.  Neurological:     Mental Status: She is alert and oriented to person, place, and time.  Psychiatric:        Mood and Affect: Mood normal.     Ortho Exam Shoulder she has 5 out of 5 strength with external and internal rotation against resistance.  Empty can test is negative bilaterally.  Positive impingement testing on the right negative on the left.  Active overhead activity is slightly limited on the right compared to the left which is full. Specialty Comments:  No specialty comments available.  Imaging: No results found.   PMFS History: Patient Active Problem List   Diagnosis Date Noted  . Multiple falls 11/29/2019  . Generalized weakness 11/21/2019  . Orthostatic hypotension 11/21/2019  . Ureteral stricture 09/07/2019  .  Osteopenia 08/28/2019  . Spinal stenosis of lumbar region with neurogenic claudication 03/11/2017  . History of kidney stones 12/04/2015  . Arthritis 12/04/2015  . Hypothyroidism 12/04/2015  . HYPERCHOLESTEROLEMIA 08/05/2009  . Essential hypertension 08/05/2009  . GERD 08/05/2009   Past Medical History:  Diagnosis Date  . Arthritis    lower back  . Former smoker   . GERD (gastroesophageal reflux disease)   . History of appendicitis   . History of blood transfusion 1960   after delivery of son  . History of cataract   . History of kidney stones 1999   lithrotripsy  .  Hypercholesterolemia   . Hypertension   . Hypothyroid   . Iron deficiency anemia   . Macular degeneration    bilateral  . Pinched nerve    back  . Seasonal allergies    RHINITIS  . Tachycardia   . Wears glasses    reading    Family History  Problem Relation Age of Onset  . Arthritis Mother   . Diabetes Mother   . Arthritis Father   . Diabetes Father   . Hyperlipidemia Father   . Colon cancer Brother 55  . Colon polyps Brother 70  . Cancer Paternal Grandmother   . Heart failure Sister   . Hyperlipidemia Sister   . Rectal cancer Neg Hx   . Stomach cancer Neg Hx   . Esophageal cancer Neg Hx     Past Surgical History:  Procedure Laterality Date  . ABDOMINAL HYSTERECTOMY    . APPENDECTOMY    . cataract surgery Bilateral 03/2014   polyps  . COLONOSCOPY  02/2010  . CYSTOSCOPY W/ URETERAL STENT PLACEMENT Right 09/07/2019   Procedure: CYSTOSCOPY WITH STENT REPLACEMENT WITH RETROGRADES;  Surgeon: Alexis Frock, MD;  Location: [facility];  Service: Urology;  Laterality: Right;  . CYSTOSCOPY/RETROGRADE/URETEROSCOPY Bilateral 07/18/2019   Procedure: CYSTOSCOPY/RETROGRADE/RIGHT URETEROSCOPY/BLADDER BIOPSY/STENT PLACEMENT/RIGHT URETERAL MASS BIOPSY;  Surgeon: Alexis Frock, MD;  Location[facility];  Service: Urology;  Laterality: Bilateral;  1 HR   Social History   Occupational History  . Not on file  Tobacco Use  . Smoking status: Former Smoker    Packs/day: 1.00    Years: 20.00    Pack years: 20.00    Types: Cigarettes    Quit date: 10/24/1993    Years since quitting: 27.4  . Smokeless tobacco: Never Used  Vaping Use  . Vaping Use: Never used  Substance and Sexual Activity  . Alcohol use: Yes    Alcohol/week: 5.0 standard drinks    Types: 5 Glasses of wine per week    Comment: 4-5 glasses of wine per week.   . Drug use: No  . Sexual activity: Yes    Birth control/protection: Post-menopausal, Surgical

## 2021-05-06 ENCOUNTER — Ambulatory Visit: Payer: PPO | Admitting: Orthopaedic Surgery

## 2021-05-06 ENCOUNTER — Other Ambulatory Visit: Payer: Self-pay

## 2021-05-06 ENCOUNTER — Encounter: Payer: Self-pay | Admitting: Orthopaedic Surgery

## 2021-05-06 DIAGNOSIS — M7541 Impingement syndrome of right shoulder: Secondary | ICD-10-CM

## 2021-05-06 MED ORDER — TIZANIDINE HCL 4 MG PO TABS: ORAL_TABLET | ORAL | 0 refills | Status: DC

## 2021-05-06 NOTE — Progress Notes (Signed)
HPI: Allison [name] returns today for follow-up of her right shoulder pain.  She was last seen 03/19/2021 and was given a subacromial injection.  She states that the injection helped with her pain for less than 24 hours and then her pain returned.  She has had no new injury to the shoulder.  She is having a lot of pain in that shoulder but no radicular symptoms down the arm. She does report that Zanaflex and Pennsaid about the shoulder girdle does help with the pain.  She she is try doing the home exercises without any real relief.  Physical exam: Right shoulder she has active forward flexion to approximately 160 degrees on the right.  No weakness with external and internal rotation against resistance.  Impingement testing positive on the right negative on the left.  Impression: Right shoulder impingement  Plan due to the fact the patient is failed conservative treatment recommend MRI to rule out rotator cuff tear.  Have her follow-up after the MRI to go over results discuss further treatment.  Questions encouraged and answered by Dr. Ninfa Linden self.  Prescription for Zanaflex was sent in.  She will pick up some Voltaren gel and begin using this on the shoulder up to 4 g 4 times daily.

## 2021-05-07 ENCOUNTER — Other Ambulatory Visit: Payer: Self-pay

## 2021-05-07 DIAGNOSIS — Z96611 Presence of right artificial shoulder joint: Secondary | ICD-10-CM

## 2021-05-25 ENCOUNTER — Ambulatory Visit
Admission: RE | Admit: 2021-05-25 | Discharge: 2021-05-25 | Disposition: A | Discharge status: Home / Self Care | Payer: PPO | Source: Ambulatory Visit | Attending: Orthopaedic Surgery | Admitting: Orthopaedic Surgery

## 2021-05-25 DIAGNOSIS — Z96611 Presence of right artificial shoulder joint: Secondary | ICD-10-CM

## 2021-05-25 DIAGNOSIS — M25511 Pain in right shoulder: Secondary | ICD-10-CM | POA: Diagnosis not present

## 2021-05-27 ENCOUNTER — Telehealth: Payer: Self-pay | Admitting: Orthopaedic Surgery

## 2021-05-27 NOTE — Telephone Encounter (Signed)
Pt called stating she had an MRI on 05/25/21 and would like a CB with the results.   [phone]

## 2021-06-04 ENCOUNTER — Encounter: Payer: Self-pay | Admitting: Family Medicine

## 2021-06-04 ENCOUNTER — Ambulatory Visit (INDEPENDENT_AMBULATORY_CARE_PROVIDER_SITE_OTHER): Payer: PPO | Admitting: Family Medicine

## 2021-06-04 ENCOUNTER — Other Ambulatory Visit: Payer: Self-pay

## 2021-06-04 VITALS — BP 158/70 | HR 62 | Temp 96.7°F | Ht 66.0 in | Wt 187.6 lb

## 2021-06-04 DIAGNOSIS — M8580 Other specified disorders of bone density and structure, unspecified site: Secondary | ICD-10-CM | POA: Diagnosis not present

## 2021-06-04 DIAGNOSIS — E039 Hypothyroidism, unspecified: Secondary | ICD-10-CM | POA: Diagnosis not present

## 2021-06-04 DIAGNOSIS — D692 Other nonthrombocytopenic purpura: Secondary | ICD-10-CM | POA: Diagnosis not present

## 2021-06-04 DIAGNOSIS — I10 Essential (primary) hypertension: Secondary | ICD-10-CM | POA: Diagnosis not present

## 2021-06-04 DIAGNOSIS — Z0000 Encounter for general adult medical examination without abnormal findings: Secondary | ICD-10-CM | POA: Diagnosis not present

## 2021-06-04 DIAGNOSIS — I700 Atherosclerosis of aorta: Secondary | ICD-10-CM | POA: Diagnosis not present

## 2021-06-04 DIAGNOSIS — N135 Crossing vessel and stricture of ureter without hydronephrosis: Secondary | ICD-10-CM

## 2021-06-04 DIAGNOSIS — E7800 Pure hypercholesterolemia, unspecified: Secondary | ICD-10-CM

## 2021-06-04 DIAGNOSIS — Z8616 Personal history of COVID-19: Secondary | ICD-10-CM

## 2021-06-04 DIAGNOSIS — K219 Gastro-esophageal reflux disease without esophagitis: Secondary | ICD-10-CM

## 2021-06-04 DIAGNOSIS — M1990 Unspecified osteoarthritis, unspecified site: Secondary | ICD-10-CM

## 2021-06-04 DIAGNOSIS — Z8781 Personal history of (healed) traumatic fracture: Secondary | ICD-10-CM | POA: Insufficient documentation

## 2021-06-04 DIAGNOSIS — R296 Repeated falls: Secondary | ICD-10-CM

## 2021-06-04 DIAGNOSIS — M48062 Spinal stenosis, lumbar region with neurogenic claudication: Secondary | ICD-10-CM

## 2021-06-04 DIAGNOSIS — Z87442 Personal history of urinary calculi: Secondary | ICD-10-CM | POA: Diagnosis not present

## 2021-06-04 MED ORDER — SIMVASTATIN 40 MG PO TABS: 40.0000 mg | ORAL_TABLET | Freq: Every day | ORAL | 3 refills | Status: DC

## 2021-06-04 MED ORDER — LEVOTHYROXINE SODIUM 75 MCG PO TABS: 75.0000 ug | ORAL_TABLET | Freq: Every day | ORAL | 3 refills | Status: DC

## 2021-06-04 NOTE — Progress Notes (Signed)
Allison [name] is a 80 y.o. female who presents for annual wellness visit and follow-up on chronic medical conditions.  She had COVID earlier this year and because of difficulties with this did fall and have a fracture of her left shoulder.  She is doing fine with that now.  She continues on her statin and is having no difficulty with that.  Does have evidence of aortic atherosclerosis.  Continues on her Prilosec for reflux.  She is taking levothyroxine and doing well on that.  Is taking losartan/HCTZ.  Does have a history of kidney stones but has had not had any recently.  Her back is causing no difficulty.  She does have osteopenia and has been taking vitamin D and calcium.  She has seen urology in the past but seem to be doing nicely having no urologic symptoms at the present time.  Does complain of some minimal arthritis changes mainly in her hands.  None of which bothers her very much.  Immunizations and Health Maintenance Immunization History  Administered Date(s) Administered   DTaP 05/13/1987   HPV Quadrivalent 04/30/1955, 06/01/1955, 01/14/1956   Influenza Split 08/09/2015   Influenza Whole 08/17/2007   Influenza, High Dose Seasonal PF 07/23/2016, 07/11/2017, 08/03/2018, 06/08/2019   PFIZER(Purple Top)SARS-COV-2 Vaccination 11/13/2019, 01/01/2020   Pneumococcal Conjugate-13 12/04/2015   Pneumococcal Polysaccharide-23 08/17/2007, 10/21/2014, 09/08/2019   Tdap 12/04/2015   Zoster Recombinat (Shingrix) 02/24/2018, 08/14/2018, 06/20/2019   Zoster, Live 12/04/2015   Health Maintenance Due  Topic Date Due   COVID-19 Vaccine (3 - Booster for Pfizer series) 06/02/2020   COLONOSCOPY (Pts 45-41yr Insurance coverage will need to be confirmed)  07/07/2020   INFLUENZA VACCINE  05/25/2021    Last Pap smear: aged out  Last mammogram: 09/08/20 Last colonoscopy: 07/14/15 Last DEXA: 08/24/19 Dentist: Q year Ophtho: Q  two years  Exercise: yard work daily  Other doctors caring for patient  include:Dr. BNinfa Lindenortho, Dr. MTresa Mooreurology  Advanced directives: Does Patient Have a Medical Advance Directive?: No Would patient like information on creating a medical advance directive?: Yes (ED - Information included in AVS)  Depression screen:  See questionnaire below.  Depression screen PScnetx2/9 06/04/2021 05/22/2020 08/03/2018 12/09/2016 12/04/2015  Decreased Interest 0 0 0 0 0  Down, Depressed, Hopeless 0 0 0 0 0  PHQ - 2 Score 0 0 0 0 0    Fall Risk Screen: see questionnaire below. Fall Risk  06/04/2021 09/19/2019 08/03/2018 12/09/2016 12/04/2015  Falls in the past year? 1 0 No No No  Comment - Emmi Telephone Survey: data to providers prior to load - - -  Number falls in past yr: 1 - - - -  Injury with Fall? 1 - - - -  Risk for fall due to : Impaired balance/gait - - - -  Follow up Falls prevention discussed;Falls evaluation completed;Education provided - - - -    ADL screen:  See questionnaire below Functional Status Survey: Is the patient deaf or have difficulty hearing?: No Does the patient have difficulty seeing, even when wearing glasses/contacts?: No Does the patient have difficulty concentrating, remembering, or making decisions?: Yes (remembering) Does the patient have difficulty walking or climbing stairs?: No Does the patient have difficulty dressing or bathing?: No Does the patient have difficulty doing errands alone such as visiting a doctor's office or shopping?: No   Review of Systems Constitutional: -, -unexpected weight change, -anorexia, -fatigue Allergy: -sneezing, -itching, -congestion Dermatology: denies changing moles, rash, lumps ENT: -runny nose, -ear pain, -  sore throat,  Cardiology:  -chest pain, -palpitations, -orthopnea, Respiratory: -cough, -shortness of breath, -dyspnea on exertion, -wheezing,  Gastroenterology: -abdominal pain, -nausea, -vomiting, -diarrhea, -constipation, -dysphagia Hematology: -bleeding or bruising problems Musculoskeletal:  -arthralgias, -myalgias, -joint swelling, -back pain, - Ophthalmology: -vision changes,  Urology: -dysuria, -difficulty urinating,  -urinary frequency, -urgency, incontinence Neurology: -, -numbness, , -memory loss, -falls, -dizziness    PHYSICAL EXAM:  General Appearance: Alert, cooperative, no distress, appears stated age Head: Normocephalic, without obvious abnormality, atraumatic Eyes: PERRL, conjunctiva/corneas clear, EOM's intact, Ears: Normal TM's and external ear canals Nose: Nares normal, mucosa normal, no drainage or sinus tenderness Throat: Lips, mucosa, and tongue normal; teeth and gums normal Neck: Supple, no lymphadenopathy;  thyroid:  no enlargement/tenderness/nodules; no carotid bruit or JVD Lungs: Clear to auscultation bilaterally without wheezes, rales or ronchi; respirations unlabored Heart: Regular rate and rhythm, S1 and S2 normal, no murmur, rubor gallop Abdomen: Soft, non-tender, nondistended, normoactive bowel sounds,  no masses, no hepatosplenomegaly Extremities: No clubbing, cyanosis or edema  Skin: Purpuric lesions noted on both forearms. Lymph nodes: Cervical, supraclavicular, and axillary nodes normal Neurologic:  CNII-XII intact, normal strength, sensation and gait; reflexes 2+ and symmetric throughout Psych: Normal mood, affect, hygiene and grooming.  ASSESSMENT/PLAN: Routine general medical examination at a health care facility - Plan: CBC with Differential/Platelet, Comprehensive metabolic panel, Lipid panel  HYPERCHOLESTEROLEMIA - Plan: simvastatin (ZOCOR) 40 MG tablet  Hypothyroidism, unspecified type - Plan: levothyroxine (SYNTHROID) 75 MCG tablet, TSH  History of COVID-19  Senile purpura ([place]) - Plan: CBC with Differential/Platelet, Comprehensive metabolic panel  Essential hypertension - Plan: CBC with Differential/Platelet, Comprehensive metabolic panel  Osteopenia, unspecified location - Plan: DG Bone Density  Spinal stenosis of lumbar  region with neurogenic claudication  History of kidney stones  Atherosclerosis of aorta (HCC) - Plan: Lipid panel  Gastroesophageal reflux disease without esophagitis  Arthritis  History of shoulder fracture  Multiple falls  Ureteral stricture Continue on present medications and follow-up with urology as needed.  No therapy needed for kidney stones.  She is on a statin for her atherosclerosis.  Continue on her PPI.   Discussed monthly self breast exams and yearly mammograms; at least 30 minutes of aerobic activity at least 5 days/week and weight-bearing exercise 2x/week; proper sunscreen use reviewed; healthy diet, including goals of calcium and vitamin D intake and alcohol recommendations (less than or equal to 1 drink/day) reviewed; regular seatbelt use; changing batteries in smoke detectors.  Immunization recommendations discussed.  Colonoscopy recommendations reviewed   Medicare Attestation I have personally reviewed: The patient's medical and social history Their use of alcohol, tobacco or illicit drugs Their current medications and supplements The patient's functional ability including ADLs,fall risks, home safety risks, cognitive, and hearing and visual impairment Diet and physical activities Evidence for depression or mood disorders  The patient's weight, height, and BMI have been recorded in the chart.  I have made referrals, counseling, and provided education to the patient based on review of the above and I have provided the patient with a written personalized care plan for preventive services.     Jill Alexanders, MD   06/04/2021

## 2021-06-05 LAB — CBC WITH DIFFERENTIAL/PLATELET
Basophils Absolute: 0.1 10*3/uL (ref 0.0–0.2)
Basos: 1 %
EOS (ABSOLUTE): 0.2 10*3/uL (ref 0.0–0.4)
Eos: 2 %
Hematocrit: 42.1 % (ref 34.0–46.6)
Hemoglobin: 14.4 g/dL (ref 11.1–15.9)
Immature Grans (Abs): 0 10*3/uL (ref 0.0–0.1)
Immature Granulocytes: 0 %
Lymphocytes Absolute: 1.9 10*3/uL (ref 0.7–3.1)
Lymphs: 23 %
MCH: 31.9 pg (ref 26.6–33.0)
MCHC: 34.2 g/dL (ref 31.5–35.7)
MCV: 93 fL (ref 79–97)
Monocytes Absolute: 0.5 10*3/uL (ref 0.1–0.9)
Monocytes: 7 %
Neutrophils Absolute: 5.4 10*3/uL (ref 1.4–7.0)
Neutrophils: 67 %
Platelets: 169 10*3/uL (ref 150–450)
RBC: 4.51 x10E6/uL (ref 3.77–5.28)
RDW: 12.1 % (ref 11.7–15.4)
WBC: 8.1 10*3/uL (ref 3.4–10.8)

## 2021-06-05 LAB — COMPREHENSIVE METABOLIC PANEL
ALT: 13 IU/L (ref 0–32)
AST: 30 IU/L (ref 0–40)
Albumin/Globulin Ratio: 1.9 (ref 1.2–2.2)
Albumin: 4.6 g/dL (ref 3.6–4.6)
Alkaline Phosphatase: 56 IU/L (ref 44–121)
BUN/Creatinine Ratio: 21 (ref 12–28)
BUN: 23 mg/dL (ref 8–27)
Bilirubin Total: 0.8 mg/dL (ref 0.0–1.2)
CO2: 25 mmol/L (ref 20–29)
Calcium: 10.2 mg/dL (ref 8.7–10.3)
Chloride: 103 mmol/L (ref 96–106)
Creatinine, Ser: 1.11 mg/dL — ABNORMAL HIGH (ref 0.57–1.00)
Globulin, Total: 2.4 g/dL (ref 1.5–4.5)
Glucose: 99 mg/dL (ref 65–99)
Potassium: 4.9 mmol/L (ref 3.5–5.2)
Sodium: 146 mmol/L — ABNORMAL HIGH (ref 134–144)
Total Protein: 7 g/dL (ref 6.0–8.5)
eGFR: 50 mL/min/{1.73_m2} — ABNORMAL LOW (ref >59)

## 2021-06-05 LAB — LIPID PANEL
Chol/HDL Ratio: 3.1 ratio (ref 0.0–4.4)
Cholesterol, Total: 193 mg/dL (ref 100–199)
HDL: 62 mg/dL (ref >39)
LDL Chol Calc (NIH): 114 mg/dL — ABNORMAL HIGH (ref 0–99)
Triglycerides: 94 mg/dL (ref 0–149)
VLDL Cholesterol Cal: 17 mg/dL (ref 5–40)

## 2021-06-05 LAB — TSH: TSH: 5.19 u[IU]/mL — ABNORMAL HIGH (ref 0.450–4.500)

## 2021-06-10 ENCOUNTER — Other Ambulatory Visit: Payer: Self-pay

## 2021-06-10 ENCOUNTER — Encounter: Payer: Self-pay | Admitting: Physician Assistant

## 2021-06-10 ENCOUNTER — Ambulatory Visit: Payer: PPO | Admitting: Physician Assistant

## 2021-06-10 DIAGNOSIS — G8929 Other chronic pain: Secondary | ICD-10-CM

## 2021-06-10 DIAGNOSIS — M25511 Pain in right shoulder: Secondary | ICD-10-CM | POA: Diagnosis not present

## 2021-06-10 DIAGNOSIS — M7541 Impingement syndrome of right shoulder: Secondary | ICD-10-CM

## 2021-06-10 NOTE — Progress Notes (Signed)
.  HPI: Right shoulder Allison [name] returns today for follow-up of her right shoulder.  She still having considerable pain in the shoulder.  She has failed conservative treatment which is included exercise, medications and subacromial injections.  Last subacromial injection on 03/19/2021 gave her about 24 hours of relief and then her pain returned in the right shoulder. MRI right shoulder is reviewed with the patient.  MRI dated 05/26/2021 showed partial-thickness tearing of the supraspinatus and infraspinatus tendons.  There is no full-thickness rotator cuff tear seen.  Type II acromion seen.  Moderate AC joint degeneration noted.  And mild glenohumeral arthropathy.  Physical exam: Bilateral shoulder she has 5 5 strength with external rotation against resistance.  She is full forward flexion of the right shoulder today.  Impingement testing positive on the right.   Impression: Right shoulder tendinopathy  Plan: Given patient's failure of conservative treatment continued pain in the shoulder.  Recommend right shoulder arthroscopy with subacromial decompression.  Risk benefits surgery discussed.  Postoperative protocol discussed.  Reviewed with right shoulder arthroscopy in the near future.  She will follow-up with [place] 1 week postop.  Questions encouraged and answered at length today.

## 2021-06-25 ENCOUNTER — Other Ambulatory Visit: Payer: Self-pay | Admitting: Orthopaedic Surgery

## 2021-06-25 ENCOUNTER — Telehealth: Payer: Self-pay

## 2021-06-25 DIAGNOSIS — M778 Other enthesopathies, not elsewhere classified: Secondary | ICD-10-CM | POA: Diagnosis not present

## 2021-06-25 DIAGNOSIS — M7541 Impingement syndrome of right shoulder: Secondary | ICD-10-CM | POA: Diagnosis not present

## 2021-06-25 DIAGNOSIS — M659 Synovitis and tenosynovitis, unspecified: Secondary | ICD-10-CM | POA: Diagnosis not present

## 2021-06-25 DIAGNOSIS — M19011 Primary osteoarthritis, right shoulder: Secondary | ICD-10-CM | POA: Diagnosis not present

## 2021-06-25 DIAGNOSIS — M24111 Other articular cartilage disorders, right shoulder: Secondary | ICD-10-CM | POA: Diagnosis not present

## 2021-06-25 DIAGNOSIS — M75111 Incomplete rotator cuff tear or rupture of right shoulder, not specified as traumatic: Secondary | ICD-10-CM | POA: Diagnosis not present

## 2021-06-25 DIAGNOSIS — G8918 Other acute postprocedural pain: Secondary | ICD-10-CM | POA: Diagnosis not present

## 2021-06-25 MED ORDER — HYDROCODONE-ACETAMINOPHEN 5-325 MG PO TABS: 1.0000 | ORAL_TABLET | Freq: Four times a day (QID) | ORAL | 0 refills | Status: DC | PRN

## 2021-06-25 MED ORDER — ONDANSETRON 4 MG PO TBDP: 4.0000 mg | ORAL_TABLET | Freq: Three times a day (TID) | ORAL | 1 refills | Status: DC | PRN

## 2021-06-25 NOTE — Telephone Encounter (Signed)
Patient called wanting to know if it is normal for her right arm to be numb and to have no feeling in her right arm?  Patient stated that she had right shoulder surgery this morning.  CB# [phone].  Please advise.  Thank you.

## 2021-06-25 NOTE — Telephone Encounter (Signed)
Patient aware of the below message  

## 2021-07-02 ENCOUNTER — Ambulatory Visit (INDEPENDENT_AMBULATORY_CARE_PROVIDER_SITE_OTHER): Payer: PPO | Admitting: Physician Assistant

## 2021-07-02 ENCOUNTER — Other Ambulatory Visit: Payer: Self-pay | Admitting: Orthopaedic Surgery

## 2021-07-02 VITALS — Ht 66.0 in | Wt 187.0 lb

## 2021-07-02 DIAGNOSIS — Z9889 Other specified postprocedural states: Secondary | ICD-10-CM

## 2021-07-02 NOTE — Progress Notes (Signed)
HPI: Allison [name] returns today 1 week status post right shoulder arthroscopy with debridement and subacromial decompression.  She states that shoulder overall feels much improved.  Had no fevers chills.  No signs of infection on the shoulder.  Physical exam: Right shoulder port sites are well approximated with interrupted nylon sutures no signs of infection.  She has forward flexion shoulder to approximately 170 degrees actively.  Good range of motion of the right elbow without pain.  Impression: Status post right shoulder arthroscopy with extensive debridement and subacromial decompression  Plan: Sutures removed.  She we will begin pendulum Codman and overhead exercises as shown today.  Follow-up with [place] in 4 weeks sooner if there is any questions concerns.  Arthroscopic images were reviewed with the patient today.

## 2021-07-30 ENCOUNTER — Encounter: Payer: Self-pay | Admitting: Physician Assistant

## 2021-07-30 ENCOUNTER — Ambulatory Visit (INDEPENDENT_AMBULATORY_CARE_PROVIDER_SITE_OTHER): Payer: PPO | Admitting: Physician Assistant

## 2021-07-30 ENCOUNTER — Other Ambulatory Visit: Payer: Self-pay

## 2021-07-30 DIAGNOSIS — Z9889 Other specified postprocedural states: Secondary | ICD-10-CM

## 2021-07-30 DIAGNOSIS — M25511 Pain in right shoulder: Secondary | ICD-10-CM | POA: Diagnosis not present

## 2021-07-30 MED ORDER — LIDOCAINE HCL 1 % IJ SOLN
3.0000 mL | INTRAMUSCULAR | Status: AC | PRN
  Administered 2021-07-30: 3 mL

## 2021-07-30 MED ORDER — METHYLPREDNISOLONE ACETATE 40 MG/ML IJ SUSP
40.0000 mg | INTRAMUSCULAR | Status: AC | PRN
  Administered 2021-07-30: 40 mg via INTRA_ARTICULAR
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
## 2021-08-18 ENCOUNTER — Telehealth: Payer: Self-pay

## 2021-08-18 NOTE — Telephone Encounter (Signed)
Cyril Mourning, physical therapist called wanted to know if patient had any precautions/restrictions for operative shoulder regarding therapy. Please call back and advise [phone]

## 2021-08-18 NOTE — Telephone Encounter (Signed)
Please advise 

## 2021-08-19 ENCOUNTER — Ambulatory Visit: Payer: PPO | Attending: Physician Assistant | Admitting: Physical Therapy

## 2021-08-19 ENCOUNTER — Other Ambulatory Visit: Payer: Self-pay

## 2021-08-19 ENCOUNTER — Encounter: Payer: Self-pay | Admitting: Physical Therapy

## 2021-08-19 DIAGNOSIS — M6281 Muscle weakness (generalized): Secondary | ICD-10-CM | POA: Diagnosis not present

## 2021-08-19 DIAGNOSIS — G8929 Other chronic pain: Secondary | ICD-10-CM | POA: Insufficient documentation

## 2021-08-19 DIAGNOSIS — R293 Abnormal posture: Secondary | ICD-10-CM | POA: Diagnosis not present

## 2021-08-19 DIAGNOSIS — M25611 Stiffness of right shoulder, not elsewhere classified: Secondary | ICD-10-CM | POA: Insufficient documentation

## 2021-08-19 DIAGNOSIS — M25511 Pain in right shoulder: Secondary | ICD-10-CM | POA: Diagnosis not present

## 2021-08-19 NOTE — Telephone Encounter (Signed)
Called and advised.

## 2021-08-19 NOTE — Therapy (Signed)
[facility] [place], [place], [address] Phone: [phone]   Fax:  [phone]  Physical Therapy Evaluation  Patient Details  Name: Allison [name]: [id number] Date of Birth: 05-09-1941 Referring Provider (PT): Erskine Emery   Encounter Date: 08/19/2021   PT End of Session - 08/19/21 1417     Visit Number 1    Number of Visits 13    Date for PT Re-Evaluation 09/30/21    Authorization Type [employer]    PT Start Time 1315    PT Stop Time 6010    PT Time Calculation (min) 42 min    Activity Tolerance Patient tolerated treatment well;Patient limited by pain    Behavior During Therapy [facility] for tasks assessed/performed             Past Medical History:  Diagnosis Date   Arthritis    lower back   Former smoker    GERD (gastroesophageal reflux disease)    History of appendicitis    History of blood transfusion 1960   after delivery of son   History of cataract    History of kidney stones 1999   lithrotripsy   Hypercholesterolemia    Hypertension    Hypothyroid    Iron deficiency anemia    Macular degeneration    bilateral   Pinched nerve    back   Seasonal allergies    RHINITIS   Tachycardia    Wears glasses    reading    Past Surgical History:  Procedure Laterality Date   ABDOMINAL HYSTERECTOMY     APPENDECTOMY     cataract surgery Bilateral 03/2014   polyps   COLONOSCOPY  02/2010   CYSTOSCOPY W/ URETERAL STENT PLACEMENT Right 09/07/2019   Procedure: CYSTOSCOPY WITH STENT REPLACEMENT WITH RETROGRADES;  Surgeon: Alexis Frock, MD;  Location: [facility];  Service: Urology;  Laterality: Right;   CYSTOSCOPY/RETROGRADE/URETEROSCOPY Bilateral 07/18/2019   Procedure: CYSTOSCOPY/RETROGRADE/RIGHT URETEROSCOPY/BLADDER BIOPSY/STENT PLACEMENT/RIGHT URETERAL MASS BIOPSY;  Surgeon: Alexis Frock, MD;  Location: [facility];  Service: Urology;  Laterality: Bilateral;  1 HR     There were no vitals filed for this visit.    Subjective Assessment - 08/19/21 1315     Subjective I had shoulder surgery 9/1; my biggest problem is going straight up/reaching overhead. Everything else is easy, as long as I'm working below shoulder height.    Patient Stated Goals get movement back in arm, be able to reach overhead, less pain    Currently in Pain? No/denies   but has up to higher end of pain scale when reaching overhead               [facility] PT Assessment - 08/19/21 0001       Assessment   Medical Diagnosis s/p R shoulder arthroscopy and SAD    Referring Provider (PT) Erskine Emery    Onset Date/Surgical Date 06/25/21    Next MD Visit f/u with Erskine Emery within the next several weeks    Prior Therapy unsure      Precautions   Precaution Comments STM deficits after Covid; no shoulder restrictions/precautions per Dr. Ninfa Linden 08/19/21      Balance Screen   Has the patient fallen in the past 6 months No    Has the patient had a decrease in activity level because of a fear of falling?  No    Is the patient reluctant to leave their home because of a fear  of falling?  No      Home Ecologist residence      Prior Function   Level of Independence Independent    Vocation Retired    Leisure playing with dogs, family time      Posture/Postural Control   Posture/Postural Control Postural limitations    Postural Limitations Rounded Shoulders;Forward head;Increased thoracic kyphosis;Decreased lumbar lordosis;Flexed trunk      ROM / Strength   AROM / PROM / Strength AROM;Strength      AROM   AROM Assessment Site Shoulder;Thoracic    Right/Left Shoulder Right;Left    Right Shoulder Flexion 120 Degrees   becomes painful at 70 degrees   Right Shoulder ABduction 84 Degrees   painful   Right Shoulder Internal Rotation --   L5   Right Shoulder External Rotation --   C7   Left Shoulder Flexion 170 Degrees    Left Shoulder  ABduction 170 Degrees    Left Shoulder Internal Rotation --   T8   Left Shoulder External Rotation --   T2   Thoracic Flexion increased kyphosis/limited segmental mobility    Thoracic Extension moderate limitation    Thoracic - Right Side Bend moderate limitation    Thoracic - Left Side Bend moderate limitation    Thoracic - Right Rotation mild limitation    Thoracic - Left Rotation mild limitation      Strength   Strength Assessment Site Shoulder;Elbow    Right/Left Shoulder Right;Left    Right Shoulder Flexion 3+/5   pain limited   Right Shoulder ABduction 3+/5   pain limited   Right Shoulder Internal Rotation 4/5    Right Shoulder External Rotation 3/5    Left Shoulder Flexion 5/5    Left Shoulder ABduction 4+/5    Right/Left Elbow Right;Left                        Objective measurements completed on examination: See above findings.       [facility] Adult PT Treatment/Exercise - 08/19/21 0001       Exercises   Exercises Shoulder;Other Exercises      Shoulder Exercises: Supine   Flexion AAROM;Right;10 reps    Flexion Limitations to tolerance- about 160 degrees    ABduction AAROM;Right;10 reps    ABduction Limitations to tolerance- about 110 degrees    Other Supine Exercises shoulder protractions R 1x10      Shoulder Exercises: Seated   Retraction Both;10 reps    Other Seated Exercises thoracic extensions 1x10                     PT Education - 08/19/21 1416     Education Details exam findings/benefits of PT and HEP; exercise form and purpose    Person(s) Educated Patient    Methods Explanation    Comprehension Verbalized understanding              PT Short Term Goals - 08/19/21 1427       PT SHORT TERM GOAL #1   Title Will be independent with initial HEP    Time 3    Period Weeks    Status New    Target Date 09/09/21      PT SHORT TERM GOAL #2   Title R shoulder flexion AROM to be at least 160 degrees and abduction AROM to  be at least 140 degrees    Time 3  Period Weeks    Status New      PT SHORT TERM GOAL #3   Title R shoulder functional ER to reach at least T2 and functional IR to reach at least L2 levels    Time 3    Period Weeks    Status New      PT SHORT TERM GOAL #4   Title Will be able to reach to shoulder height with pain no more than 4/10 R shoulder    Time 3    Period Weeks    Status New               PT Long Term Goals - 08/19/21 1429       PT LONG TERM GOAL #1   Title R shoulder strength to be at least 4/5 globally in order to reduce pain and improve shoulder function    Time 6    Period Weeks    Status New    Target Date 09/30/21      PT LONG TERM GOAL #2   Title Will be able to reach overhead with no more than 2/10 pain R shoulder in order to improve functional task tolerance    Time 6    Period Weeks    Status New      PT LONG TERM GOAL #3   Title Will be able to carry groceries and perform housework/cleaning without increase in shoulder pain    Time 6    Period Weeks    Status New      PT LONG TERM GOAL #4   Title Will demonstrate only mild limitations in thoracic mobility in order to reduce shoulder girdle pain/ROM limitations and maximize function    Time 6    Period Weeks    Status New                    Plan - 08/19/21 1420     Clinical Impression Statement Ms. Cass arrives s/p R shoulder arthroscopy, debridement, and SAD done 9/1. She tells me that she was never given formal precautions or restrictions from MD and has been using her shoulder/arm quite a bit since surgery but has also been having quite a bit of pain, especially when trying to perform tasks overhead/above shoulder height. Examination reveals postural deficits, limited ROM and strength R shoulder, inability to reach overhead without significant compensation patterns, and limited thoracic mobility. Will benefit from skilled PT services to address functional deficits, reduce  pain, and improve functional use of her RUE.    Personal Factors and Comorbidities Age;Fitness;Comorbidity 3+;Time since onset of injury/illness/exacerbation    Examination-Activity Limitations Bathing;Reach Overhead;Carry;Sleep;Dressing;Toileting;Lift;Hygiene/Grooming    Examination-Participation Restrictions Cleaning;Meal Prep;Community Activity;Driving;Shop;Laundry;Medication Management    Stability/Clinical Decision Making Stable/Uncomplicated    Clinical Decision Making Low    Rehab Potential Good    PT Frequency 2x / week    PT Duration 6 weeks    PT Treatment/Interventions ADLs/Self Care Home Management;Cryotherapy;Electrical Stimulation;Iontophoresis 4mg /ml Dexamethasone;Moist Heat;Ultrasound;Functional mobility training;Therapeutic activities;Therapeutic exercise;Neuromuscular re-education;Patient/family education;Passive range of motion;Dry needling;Energy conservation;Taping;Vasopneumatic Device;Joint Manipulations    PT Next Visit Plan review HEP; per MD, no restrictions/precautions for shoulder so ROM/strength/postural training and functional task training to tolerance    PT [place] and Agree with Plan of Care Patient             Patient will benefit from skilled therapeutic intervention in order to improve the following deficits and impairments:  Decreased  range of motion, Increased fascial restricitons, Impaired UE functional use, Impaired perceived functional ability, Pain, Improper body mechanics, Decreased strength, Postural dysfunction  Visit Diagnosis: Chronic right shoulder pain  Abnormal posture  Stiffness of right shoulder, not elsewhere classified  Muscle weakness (generalized)     Problem List Patient Active Problem List   Diagnosis Date Noted   S/P arthroscopy of right shoulder 07/30/2021   Atherosclerosis of aorta ([place]) 06/04/2021   Senile purpura ([place]) 06/04/2021   History of COVID-19 06/04/2021   History of  shoulder fracture 06/04/2021   Multiple falls 11/29/2019   Ureteral stricture 09/07/2019   Osteopenia 08/28/2019   Spinal stenosis of lumbar region with neurogenic claudication 03/11/2017   History of kidney stones 12/04/2015   Arthritis 12/04/2015   Hypothyroidism 12/04/2015   HYPERCHOLESTEROLEMIA 08/05/2009   Essential hypertension 08/05/2009   GERD 08/05/2009   Ann Lions PT, DPT, PN2   Supplemental Physical Therapist [facility]    Pager [phone] Acute Rehab Office [address] [place], [place], [address] Phone: [phone]   Fax:  [phone]  Name: SARAIYAH HEMMINGER MRN: [id number] Date of Birth: Feb 16, 1941

## 2021-08-24 ENCOUNTER — Encounter: Payer: Self-pay | Admitting: Physical Therapy

## 2021-08-24 ENCOUNTER — Other Ambulatory Visit: Payer: Self-pay

## 2021-08-24 ENCOUNTER — Ambulatory Visit: Payer: PPO | Admitting: Physical Therapy

## 2021-08-24 DIAGNOSIS — M25511 Pain in right shoulder: Secondary | ICD-10-CM | POA: Diagnosis not present

## 2021-08-24 DIAGNOSIS — M25611 Stiffness of right shoulder, not elsewhere classified: Secondary | ICD-10-CM

## 2021-08-24 DIAGNOSIS — M6281 Muscle weakness (generalized): Secondary | ICD-10-CM

## 2021-08-24 DIAGNOSIS — G8929 Other chronic pain: Secondary | ICD-10-CM

## 2021-08-24 DIAGNOSIS — R293 Abnormal posture: Secondary | ICD-10-CM

## 2021-08-24 NOTE — Therapy (Signed)
[place] [place], [place], [address] Phone: [phone]   Fax:  [phone]  Physical Therapy Treatment  Patient Details  Name: Allison [name]: [id number] Date of Birth: 16-May-1941 Referring Provider (PT): Erskine Emery   Encounter Date: 08/24/2021   PT End of Session - 08/24/21 1435     Visit Number 2    Number of Visits 13    Date for PT Re-Evaluation 09/30/21    Authorization Type [employer]    PT Start Time 1345    PT Stop Time 6962    PT Time Calculation ([name]) 40 [name]    Activity Tolerance Patient tolerated treatment well    Behavior During Therapy [facility] for tasks assessed/performed             Past Medical History:  Diagnosis Date   Arthritis    lower back   Former smoker    GERD (gastroesophageal reflux disease)    History of appendicitis    History of blood transfusion 1960   after delivery of son   History of cataract    History of kidney stones 1999   lithrotripsy   Hypercholesterolemia    Hypertension    Hypothyroid    Iron deficiency anemia    Macular degeneration    bilateral   Pinched nerve    back   Seasonal allergies    RHINITIS   Tachycardia    Wears glasses    reading    Past Surgical History:  Procedure Laterality Date   ABDOMINAL HYSTERECTOMY     APPENDECTOMY     cataract surgery Bilateral 03/2014   polyps   COLONOSCOPY  02/2010   CYSTOSCOPY W/ URETERAL STENT PLACEMENT Right 09/07/2019   Procedure: CYSTOSCOPY WITH STENT REPLACEMENT WITH RETROGRADES;  Surgeon: Alexis Frock, MD;  Location: [facility];  Service: Urology;  Laterality: Right;   CYSTOSCOPY/RETROGRADE/URETEROSCOPY Bilateral 07/18/2019   Procedure: CYSTOSCOPY/RETROGRADE/RIGHT URETEROSCOPY/BLADDER BIOPSY/STENT PLACEMENT/RIGHT URETERAL MASS BIOPSY;  Surgeon: Alexis Frock, MD;  Location: [facility];  Service: Urology;  Laterality: Bilateral;  1 HR    There were no vitals  filed for this visit.   Subjective Assessment - 08/24/21 1345     Subjective "Going fine, been making it"    Currently in Pain? Yes    Pain Score 2     Pain Location Shoulder    Pain Orientation Right                               [facility] Adult PT Treatment/Exercise - 08/24/21 0001       Shoulder Exercises: Standing   External Rotation Strengthening;Right;20 reps;Theraband    Theraband Level (Shoulder External Rotation) Level 1 (Yellow)    Internal Rotation Strengthening;Right;Theraband;20 reps    Theraband Level (Shoulder Internal Rotation) Level 1 (Yellow)    Row Strengthening;Theraband;20 reps    Theraband Level (Shoulder Row) Level 2 (Red)      Shoulder Exercises: ROM/Strengthening   UBE (Upper Arm Bike) L1 x 3 [name] each    Other ROM/Strengthening Exercises AAROM Flex, Ext IR up back x10 each    Other ROM/Strengthening Exercises 3 level cabinet reaches flex & abd 2x5      Manual Therapy   Manual Therapy Passive ROM    Passive ROM R shoulder in all directions  PT Short Term Goals - 08/24/21 1438       PT SHORT TERM GOAL #1   Title Will be independent with initial HEP    Status Partially Met      PT SHORT TERM GOAL #2   Title R shoulder flexion AROM to be at least 160 degrees and abduction AROM to be at least 140 degrees    Status On-going               PT Long Term Goals - 08/19/21 1429       PT LONG TERM GOAL #1   Title R shoulder strength to be at least 4/5 globally in order to reduce pain and improve shoulder function    Time 6    Period Weeks    Status New    Target Date 09/30/21      PT LONG TERM GOAL #2   Title Will be able to reach overhead with no more than 2/10 pain R shoulder in order to improve functional task tolerance    Time 6    Period Weeks    Status New      PT LONG TERM GOAL #3   Title Will be able to carry groceries and perform housework/cleaning without increase in shoulder  pain    Time 6    Period Weeks    Status New      PT LONG TERM GOAL #4   Title Will demonstrate only mild limitations in thoracic mobility in order to reduce shoulder girdle pain/ROM limitations and maximize function    Time 6    Period Weeks    Status New                   Plan - 08/24/21 1437     Clinical Impression Statement Pt tolerated an initial progression to TE well. Cue for pacing needed with AAROM flexion and standing Tband rows. Tactile cues to T spine to prevent posterior leaning with standing rows and extensions. Tactile cue to elbow to maintain proper RUE positioning with ER/IR. Pt did ave some end rang tightness and pain with MT more so with abduction.    Personal Factors and Comorbidities Age;Fitness;Comorbidity 3+;Time since onset of injury/illness/exacerbation    Examination-Activity Limitations Bathing;Reach Overhead;Carry;Sleep;Dressing;Toileting;Lift;Hygiene/Grooming    Examination-Participation Restrictions Cleaning;Meal Prep;Community Activity;Driving;Shop;Laundry;Medication Management    Stability/Clinical Decision Making Stable/Uncomplicated    Rehab Potential Good    PT Frequency 2x / week    PT Duration 6 weeks    PT Treatment/Interventions ADLs/Self Care Home Management;Cryotherapy;Electrical Stimulation;Iontophoresis 3m/ml Dexamethasone;Moist Heat;Ultrasound;Functional mobility training;Therapeutic activities;Therapeutic exercise;Neuromuscular re-education;Patient/family education;Passive range of motion;Dry needling;Energy conservation;Taping;Vasopneumatic Device;Joint Manipulations    PT Next Visit Plan per MD, no restrictions/precautions for shoulder so ROM/strength/postural training and functional task training to tolerance             Patient will benefit from skilled therapeutic intervention in order to improve the following deficits and impairments:  Decreased range of motion, Increased fascial restricitons, Impaired UE functional use,  Impaired perceived functional ability, Pain, Improper body mechanics, Decreased strength, Postural dysfunction  Visit Diagnosis: Chronic right shoulder pain  Stiffness of right shoulder, not elsewhere classified  Abnormal posture  Muscle weakness (generalized)     Problem List Patient Active Problem List   Diagnosis Date Noted   S/P arthroscopy of right shoulder 07/30/2021   A[place]rosclerosis of aorta ([place] 06/04/2021   [place]ile purpura ([place] 06/04/2021   History of COVID-19 06/04/2021   History of shoulder fracture 06/04/2021  Multiple falls 11/29/2019   Ureteral stricture 09/07/2019   Osteopenia 08/28/2019   Spinal stenosis of lumbar region with neurogenic claudication 03/11/2017   History of kidney stones 12/04/2015   Arthritis 12/04/2015   Hypothyroidism 12/04/2015   HYPERCHOLESTEROLEMIA 08/05/2009   Essential hypertension 08/05/2009   GERD 08/05/2009    Scot Jun, PTA 08/24/2021, 2:43 PM  [place]. [place], [place], [address] Phone: [phone]   Fax:  [phone]  Name: BLOSSOM CRUME MRN: [id number] Date of Birth: Dec 26, 1940

## 2021-08-26 ENCOUNTER — Ambulatory Visit: Payer: PPO | Attending: Physician Assistant | Admitting: Physical Therapy

## 2021-08-26 ENCOUNTER — Encounter: Payer: Self-pay | Admitting: Physical Therapy

## 2021-08-26 ENCOUNTER — Other Ambulatory Visit: Payer: Self-pay

## 2021-08-26 DIAGNOSIS — M6281 Muscle weakness (generalized): Secondary | ICD-10-CM

## 2021-08-26 DIAGNOSIS — G8929 Other chronic pain: Secondary | ICD-10-CM | POA: Diagnosis not present

## 2021-08-26 DIAGNOSIS — R600 Localized edema: Secondary | ICD-10-CM | POA: Diagnosis not present

## 2021-08-26 DIAGNOSIS — M25611 Stiffness of right shoulder, not elsewhere classified: Secondary | ICD-10-CM

## 2021-08-26 DIAGNOSIS — M25511 Pain in right shoulder: Secondary | ICD-10-CM | POA: Insufficient documentation

## 2021-08-26 DIAGNOSIS — R293 Abnormal posture: Secondary | ICD-10-CM

## 2021-08-26 NOTE — Therapy (Signed)
[place] [place], [place], [address] Phone: [phone]   Fax:  [phone]  Physical Therapy Treatment  Patient Details  Name: Allison [name]: [id number] Date of Birth: Mar 25, 1941 Referring Provider (PT): Erskine Emery   Encounter Date: 08/26/2021    Past Medical History:  Diagnosis Date   Arthritis    lower back   Former smoker    GERD (gastroesophageal reflux disease)    History of appendicitis    History of blood transfusion 1960   after delivery of son   History of cataract    History of kidney stones 1999   lithrotripsy   Hypercholesterolemia    Hypertension    Hypothyroid    Iron deficiency anemia    Macular degeneration    bilateral   Pinched nerve    back   Seasonal allergies    RHINITIS   Tachycardia    Wears glasses    reading    Past Surgical History:  Procedure Laterality Date   ABDOMINAL HYSTERECTOMY     APPENDECTOMY     cataract surgery Bilateral 03/2014   polyps   COLONOSCOPY  02/2010   CYSTOSCOPY W/ URETERAL STENT PLACEMENT Right 09/07/2019   Procedure: CYSTOSCOPY WITH STENT REPLACEMENT WITH RETROGRADES;  Surgeon: Alexis Frock, MD;  Location: [facility];  Service: Urology;  Laterality: Right;   CYSTOSCOPY/RETROGRADE/URETEROSCOPY Bilateral 07/18/2019   Procedure: CYSTOSCOPY/RETROGRADE/RIGHT URETEROSCOPY/BLADDER BIOPSY/STENT PLACEMENT/RIGHT URETERAL MASS BIOPSY;  Surgeon: Alexis Frock, MD;  [facility];  Service: Urology;  Laterality: Bilateral;  1 HR    There were no vitals filed for this visit.   Subjective Assessment - 08/26/21 1237     Subjective Arm is improving, but still hurts when she attempts to reach overhead and she definitely cannot hold it overhead, for example, to do her hair.    Patient Stated Goals get movement back in arm, be able to reach overhead, less pain    Currently in Pain? No/denies   Hurts when reaching or hoilding it  overhead                              [facility] Adult PT Treatment/Exercise - 08/26/21 0001       Shoulder Exercises: Standing   Extension Strengthening;Both;Weights;20 reps    Extension Weight (lbs) 5    Row Strengthening;Both;Weights;20 reps    Row Weight (lbs) 5      Shoulder Exercises: ROM/Strengthening   UBE (Upper Arm Bike) L1 x 3 min each    Caudal Glide self mobilization, depress and hold, 5 x 15 seconds.    Other ROM/Strengthening Exercises PROM using large therapeutic ball on mat, rolling into flex, abduction.    Other ROM/Strengthening Exercises Posterior capsular stretch, 3 x 30 seconds.      Manual Therapy   Manual Therapy Joint mobilization    Joint Mobilization Caudal glide for R shoulder. grade 2-3, 3 x 10 seconds.                     PT Education - 08/26/21 1347     Education Details Updated HEP, rationale for post capsule and caudle glide stretches.    Person(s) Educated Patient    Methods Explanation;Demonstration;Handout    Comprehension Verbalized understanding;Returned demonstration              PT Short Term Goals - 08/26/21 1349  PT SHORT TERM GOAL #1   Title Will be independent with initial HEP    Baseline updated    Time 2    Period Weeks    Status Partially Met    Target Date 09/09/21      PT SHORT TERM GOAL #2   Title R shoulder flexion AROM to be at least 160 degrees and abduction AROM to be at least 140 degrees    Status On-going      PT SHORT TERM GOAL #4   Title Will be able to reach to shoulder height with pain no more than 4/10 R shoulder    Status Achieved               PT Long Term Goals - 08/19/21 1429       PT LONG TERM GOAL #1   Title R shoulder strength to be at least 4/5 globally in order to reduce pain and improve shoulder function    Time 6    Period Weeks    Status New    Target Date 09/30/21      PT LONG TERM GOAL #2   Title Will be able to reach overhead with no  more than 2/10 pain R shoulder in order to improve functional task tolerance    Time 6    Period Weeks    Status New      PT LONG TERM GOAL #3   Title Will be able to carry groceries and perform housework/cleaning without increase in shoulder pain    Time 6    Period Weeks    Status New      PT LONG TERM GOAL #4   Title Will demonstrate only mild limitations in thoracic mobility in order to reduce shoulder girdle pain/ROM limitations and maximize function    Time 6    Period Weeks    Status New                   Plan - 08/26/21 1348     Clinical Impression Statement Patient reports continued improvement in R shoulder ROM, pain, and strength. She tolerated increased ROM as well as strengthening for scapular stabilizers.    Personal Factors and Comorbidities Age;Fitness;Comorbidity 3+;Time since onset of injury/illness/exacerbation    Examination-Activity Limitations Bathing;Reach Overhead;Carry;Sleep;Dressing;Toileting;Lift;Hygiene/Grooming    Examination-Participation Restrictions Cleaning;Meal Prep;Community Activity;Driving;Shop;Laundry;Medication Management    Stability/Clinical Decision Making Stable/Uncomplicated    Rehab Potential Good    PT Frequency 2x / week    PT Duration 6 weeks    PT Treatment/Interventions ADLs/Self Care Home Management;Cryotherapy;Electrical Stimulation;Iontophoresis 48m/ml Dexamethasone;Moist Heat;Ultrasound;Functional mobility training;Therapeutic activities;Therapeutic exercise;Neuromuscular re-education;Patient/family education;Passive range of motion;Dry needling;Energy conservation;Taping;Vasopneumatic Device;Joint Manipulations    PT Next Visit Plan per MD, no restrictions/precautions for shoulder so ROM/strength/postural training and functional task training to tolerance             Patient will benefit from skilled therapeutic intervention in order to improve the following deficits and impairments:  Decreased range of motion,  Increased fascial restricitons, Impaired UE functional use, Impaired perceived functional ability, Pain, Improper body mechanics, Decreased strength, Postural dysfunction  Visit Diagnosis: Chronic right shoulder pain  Stiffness of right shoulder, not elsewhere classified  Abnormal posture  Muscle weakness (generalized)  Localized edema     Problem List Patient Active Problem List   Diagnosis Date Noted   S/P arthroscopy of right shoulder 07/30/2021   Atherosclerosis of [place] ([place] 06/04/2021   Senile purpura ([place] 06/04/2021   History of COVID-19  06/04/2021   History of shoulder fracture 06/04/2021   Multiple falls 11/29/2019   Ureteral stricture 09/07/2019   Osteopenia 08/28/2019   Spinal stenosis of lumbar region with neurogenic claudication 03/11/2017   History of kidney stones 12/04/2015   Arthritis 12/04/2015   Hypothyroidism 12/04/2015   HYPERCHOLESTEROLEMIA 08/05/2009   Essential hypertension 08/05/2009   GERD 08/05/2009    Marcelina Morel, DPT 08/26/2021, 1:51 PM  [place]. [place], [place], [address] Phone: [phone]   Fax:  [phone]  Name: KORTNI HASTEN MRN: [id number] Date of Birth: April 13, 1941

## 2021-08-26 NOTE — Patient Instructions (Signed)
Access Code: [email or web address] URL: [email or web address] Date: 08/26/2021 Prepared by: Ethel Rana  Exercises Supine Shoulder Flexion Overhead with Dowel - 2-3 x daily - 7 x weekly - 1 sets - 10 reps Supine Shoulder Abduction AAROM with Dowel - 2-3 x daily - 7 x weekly - 1 sets - 10 reps Supine Single Arm Shoulder Protraction - 2-3 x daily - 7 x weekly - 1 sets - 10 reps Seated Scapular Retraction - 2-3 x daily - 7 x weekly - 1 sets - 10 reps - 2 hold Seated Thoracic Extension AROM - 2-3 x daily - 7 x weekly - 1 sets - 10 reps - 3 hold Standing Shoulder Posterior Capsule Stretch - 1 x daily - 7 x weekly - 3 reps - 30 hold Standing Shoulder Row with Anchored Resistance - 1 x daily - 7 x weekly - 2 sets - 10 reps Shoulder External Rotation and Scapular Retraction with Resistance - 1 x daily - 7 x weekly - 2 sets - 10 reps Supine Shoulder External Rotation with Dowel - 2-3 x daily - 7 x weekly - 1 sets - 10 reps Shoulder extension with resistance - Neutral - 1 x daily - 7 x weekly - 2 sets - 10 reps

## 2021-08-27 ENCOUNTER — Ambulatory Visit: Payer: PPO | Admitting: Orthopaedic Surgery

## 2021-08-27 ENCOUNTER — Encounter: Payer: Self-pay | Admitting: Orthopaedic Surgery

## 2021-08-27 DIAGNOSIS — G8929 Other chronic pain: Secondary | ICD-10-CM

## 2021-08-27 DIAGNOSIS — Z9889 Other specified postprocedural states: Secondary | ICD-10-CM

## 2021-08-27 DIAGNOSIS — M25511 Pain in right shoulder: Secondary | ICD-10-CM

## 2021-08-27 NOTE — Progress Notes (Signed)
The patient is a 80 year old female who is now 8-week status post a right shoulder arthroscopy with significant debridement and subacromial decompression.  She is working through physical therapy now trying to get her motion back and strength back for the right shoulder.  She is only had 3 physical therapy sessions but she does think that they are helping her and she is improving overall.  On examination of her right shoulder she is moving it much better but still lacks residual full forward flexion and full abduction.  She does show some good strength in the shoulder and is not having a lot of pain at all.  She will continue outpatient physical therapy and we will see her back for more visit in about 6 weeks from now to see how she is doing overall but no x-rays are needed.  All questions and concerns were answered and addressed.

## 2021-08-28 ENCOUNTER — Other Ambulatory Visit: Payer: Self-pay

## 2021-08-28 ENCOUNTER — Ambulatory Visit: Payer: PPO | Admitting: Physical Therapy

## 2021-08-28 ENCOUNTER — Encounter: Payer: Self-pay | Admitting: Physical Therapy

## 2021-08-28 DIAGNOSIS — M25611 Stiffness of right shoulder, not elsewhere classified: Secondary | ICD-10-CM

## 2021-08-28 DIAGNOSIS — M25511 Pain in right shoulder: Secondary | ICD-10-CM | POA: Diagnosis not present

## 2021-08-28 DIAGNOSIS — R600 Localized edema: Secondary | ICD-10-CM

## 2021-08-28 DIAGNOSIS — M6281 Muscle weakness (generalized): Secondary | ICD-10-CM

## 2021-08-28 DIAGNOSIS — G8929 Other chronic pain: Secondary | ICD-10-CM

## 2021-08-28 NOTE — Therapy (Signed)
[place]. [place], [place], [address] Phone: [phone]   Fax:  [phone]  Physical Therapy Treatment  Patient Details  Name: Allison [name]: [id number] Date of Birth: 1940-11-18 Referring Provider (PT): Erskine Emery   Encounter Date: 08/28/2021   PT End of Session - 08/28/21 1054     Visit Number 4    Date for PT Re-Evaluation 09/30/21    Authorization Type [employer]    PT Start Time 1014    PT Stop Time 1057    PT Time Calculation (min) 43 min    Activity Tolerance Patient tolerated treatment well    Behavior During Therapy [facility] for tasks assessed/performed             Past Medical History:  Diagnosis Date   Arthritis    lower back   Former smoker    GERD (gastroesophageal reflux disease)    History of appendicitis    History of blood transfusion 1960   after delivery of son   History of cataract    History of kidney stones 1999   lithrotripsy   Hypercholesterolemia    Hypertension    Hypothyroid    Iron deficiency anemia    Macular degeneration    bilateral   Pinched nerve    back   Seasonal allergies    RHINITIS   Tachycardia    Wears glasses    reading    Past Surgical History:  Procedure Laterality Date   ABDOMINAL HYSTERECTOMY     APPENDECTOMY     cataract surgery Bilateral 03/2014   polyps   COLONOSCOPY  02/2010   CYSTOSCOPY W/ URETERAL STENT PLACEMENT Right 09/07/2019   Procedure: CYSTOSCOPY WITH STENT REPLACEMENT WITH RETROGRADES;  Surgeon: Alexis Frock, MD;  Location: [facility];  Service: Urology;  Laterality: Right;   CYSTOSCOPY/RETROGRADE/URETEROSCOPY Bilateral 07/18/2019   Procedure: CYSTOSCOPY/RETROGRADE/RIGHT URETEROSCOPY/BLADDER BIOPSY/STENT PLACEMENT/RIGHT URETERAL MASS BIOPSY;  Surgeon: Alexis Frock, MD;  Location: [facility];  Service: Urology;  Laterality: Bilateral;  1 HR    There were no vitals filed for this visit.    Subjective Assessment - 08/28/21 1023     Subjective "I am feeling fine"    Currently in Pain? No/denies                [facility] PT Assessment - 08/28/21 0001       AROM   Right Shoulder Flexion 129 Degrees    Right Shoulder ABduction 87 Degrees                           [facility] Adult PT Treatment/Exercise - 08/28/21 0001       Shoulder Exercises: Supine   Other Supine Exercises LUE ER/IR 1lb x1 each      Shoulder Exercises: Standing   Flexion Strengthening;Both;20 reps;Weights    Shoulder Flexion Weight (lbs) 1    ABduction AROM;Both;20 reps      Shoulder Exercises: ROM/Strengthening   UBE (Upper Arm Bike) L1 x 3 min each    Other ROM/Strengthening Exercises AAROM Flex, Ext IR up back x10 each      Shoulder Exercises: Power [employer]   Other [employer] Exercises 15lb Rows & Lats 2x10      Manual Therapy   Manual Therapy Passive ROM    Passive ROM R shoulder in all directions  PT Short Term Goals - 08/26/21 1349       PT SHORT TERM GOAL #1   Title Will be independent with initial HEP    Baseline updated    Time 2    Period Weeks    Status Partially Met    Target Date 09/09/21      PT SHORT TERM GOAL #2   Title R shoulder flexion AROM to be at least 160 degrees and abduction AROM to be at least 140 degrees    Status On-going      PT SHORT TERM GOAL #4   Title Will be able to reach to shoulder height with pain no more than 4/10 R shoulder    Status Achieved               PT Long Term Goals - 08/19/21 1429       PT LONG TERM GOAL #1   Title R shoulder strength to be at least 4/5 globally in order to reduce pain and improve shoulder function    Time 6    Period Weeks    Status New    Target Date 09/30/21      PT LONG TERM GOAL #2   Title Will be able to reach overhead with no more than 2/10 pain R shoulder in order to improve functional task tolerance    Time 6    Period Weeks    Status New       PT LONG TERM GOAL #3   Title Will be able to carry groceries and perform housework/cleaning without increase in shoulder pain    Time 6    Period Weeks    Status New      PT LONG TERM GOAL #4   Title Will demonstrate only mild limitations in thoracic mobility in order to reduce shoulder girdle pain/ROM limitations and maximize function    Time 6    Period Weeks    Status New                   Plan - 08/28/21 1055     Clinical Impression Statement Pt has progressed some increasing her L shoulder flexion and abduction ROM. She did well progressing with machine level interventions. Tactile cues to prevent trunk leaning with seated rows. Some end range pain with MT.    Personal Factors and Comorbidities Age;Fitness;Comorbidity 3+;Time since onset of injury/illness/exacerbation    Examination-Activity Limitations Bathing;Reach Overhead;Carry;Sleep;Dressing;Toileting;Lift;Hygiene/Grooming    Examination-Participation Restrictions Cleaning;Meal Prep;Community Activity;Driving;Shop;Laundry;Medication Management    Stability/Clinical Decision Making Stable/Uncomplicated    Rehab Potential Good    PT Frequency 2x / week    PT Treatment/Interventions ADLs/Self Care Home Management;Cryotherapy;Electrical Stimulation;Iontophoresis 42m/ml Dexamethasone;Moist Heat;Ultrasound;Functional mobility training;Therapeutic activities;Therapeutic exercise;Neuromuscular re-education;Patient/family education;Passive range of motion;Dry needling;Energy conservation;Taping;Vasopneumatic Device;Joint Manipulations    PT Next Visit Plan per MD, no restrictions/precautions for shoulder so ROM/strength/postural training and functional task training to tolerance             Patient will benefit from skilled therapeutic intervention in order to improve the following deficits and impairments:  Decreased range of motion, Increased fascial restricitons, Impaired UE functional use, Impaired perceived  functional ability, Pain, Improper body mechanics, Decreased strength, Postural dysfunction  Visit Diagnosis: Chronic right shoulder pain  Localized edema  Muscle weakness (generalized)  Stiffness of right shoulder, not elsewhere classified     Problem List Patient Active Problem List   Diagnosis Date Noted   S/P arthroscopy of right shoulder 07/30/2021  Atherosclerosis of aorta ([place]) 06/04/2021   Senile purpura ([place]) 06/04/2021   History of COVID-19 06/04/2021   History of shoulder fracture 06/04/2021   Multiple falls 11/29/2019   Ureteral stricture 09/07/2019   Osteopenia 08/28/2019   Spinal stenosis of lumbar region with neurogenic claudication 03/11/2017   History of kidney stones 12/04/2015   Arthritis 12/04/2015   Hypothyroidism 12/04/2015   HYPERCHOLESTEROLEMIA 08/05/2009   Essential hypertension 08/05/2009   GERD 08/05/2009    Scot Jun, PTA 08/28/2021, 11:00 AM  [place]. [place], [place], [address] Phone: [phone]   Fax:  [phone]  Name: SAXON CROSBY MRN: [id number] Date of Birth: 05-May-1941

## 2021-08-31 ENCOUNTER — Other Ambulatory Visit: Payer: Self-pay

## 2021-08-31 ENCOUNTER — Ambulatory Visit: Payer: PPO | Admitting: Physical Therapy

## 2021-08-31 ENCOUNTER — Encounter: Payer: Self-pay | Admitting: Physical Therapy

## 2021-08-31 DIAGNOSIS — G8929 Other chronic pain: Secondary | ICD-10-CM

## 2021-08-31 DIAGNOSIS — M6281 Muscle weakness (generalized): Secondary | ICD-10-CM

## 2021-08-31 DIAGNOSIS — M25611 Stiffness of right shoulder, not elsewhere classified: Secondary | ICD-10-CM

## 2021-08-31 DIAGNOSIS — M25511 Pain in right shoulder: Secondary | ICD-10-CM

## 2021-08-31 DIAGNOSIS — R293 Abnormal posture: Secondary | ICD-10-CM

## 2021-08-31 NOTE — Therapy (Signed)
[place]. [place], [place], [address] Phone: [phone]   Fax:  [phone]  Physical Therapy Treatment  Patient Details  Name: Allison [name]: [id number] Date of Birth: [birth year] Referring Provider (PT): Erskine Emery   Encounter Date: 08/31/2021   PT End of Session - 08/31/21 1337     Visit Number 5    Date for PT Re-Evaluation 09/30/21    Authorization Type [employer]    PT Start Time 1258    PT Stop Time 1340    PT Time Calculation (min) 42 min    Activity Tolerance Patient tolerated treatment well    Behavior During Therapy [facility] for tasks assessed/performed             Past Medical History:  Diagnosis Date   Arthritis    lower back   Former smoker    GERD (gastroesophageal reflux disease)    History of appendicitis    History of blood transfusion 1960   after delivery of son   History of cataract    History of kidney stones 1999   lithrotripsy   Hypercholesterolemia    Hypertension    Hypothyroid    Iron deficiency anemia    Macular degeneration    bilateral   Pinched nerve    back   Seasonal allergies    RHINITIS   Tachycardia    Wears glasses    reading    Past Surgical History:  Procedure Laterality Date   ABDOMINAL HYSTERECTOMY     APPENDECTOMY     cataract surgery Bilateral 03/2014   polyps   COLONOSCOPY  02/2010   CYSTOSCOPY W/ URETERAL STENT PLACEMENT Right 09/07/2019   Procedure: CYSTOSCOPY WITH STENT REPLACEMENT WITH RETROGRADES;  Surgeon: Alexis Frock, MD;  Location: [facility];  Service: Urology;  Laterality: Right;   CYSTOSCOPY/RETROGRADE/URETEROSCOPY Bilateral 07/18/2019   Procedure: CYSTOSCOPY/RETROGRADE/RIGHT URETEROSCOPY/BLADDER BIOPSY/STENT PLACEMENT/RIGHT URETERAL MASS BIOPSY;  Surgeon: Alexis Frock, MD;  Location: [facility];  Service: Urology;  Laterality: Bilateral;  1 HR    There were no vitals filed for this visit.    Subjective Assessment - 08/31/21 1300     Subjective "It still hurts"    Pain Score 3     Pain Location Shoulder    Pain Orientation Right;Lateral                               [facility] Adult PT Treatment/Exercise - 08/31/21 0001       Shoulder Exercises: Standing   Extension Strengthening;Both;20 reps;Theraband    Theraband Level (Shoulder Extension) Level 2 (Red)    Row Strengthening;Both;Weights;20 reps    Theraband Level (Shoulder Row) Level 2 (Red)      Shoulder Exercises: ROM/Strengthening   UBE (Upper Arm Bike) L1 x 3 min each    Other ROM/Strengthening Exercises LAdder flex & Abd x5    Other ROM/Strengthening Exercises AAROM 2lb WaTE bar flex, Ext, IR up back x 10      Manual Therapy   Manual Therapy Passive ROM    Joint Mobilization R GH graded 1-2    Passive ROM R shoulder in all directions                       PT Short Term Goals - 08/26/21 1349       PT SHORT TERM GOAL #1   Title Will be  independent with initial HEP    Baseline updated    Time 2    Period Weeks    Status Partially Met    Target Date 09/09/21      PT SHORT TERM GOAL #2   Title R shoulder flexion AROM to be at least 160 degrees and abduction AROM to be at least 140 degrees    Status On-going      PT SHORT TERM GOAL #4   Title Will be able to reach to shoulder height with pain no more than 4/10 R shoulder    Status Achieved               PT Long Term Goals - 08/19/21 1429       PT LONG TERM GOAL #1   Title R shoulder strength to be at least 4/5 globally in order to reduce pain and improve shoulder function    Time 6    Period Weeks    Status New    Target Date 09/30/21      PT LONG TERM GOAL #2   Title Will be able to reach overhead with no more than 2/10 pain R shoulder in order to improve functional task tolerance    Time 6    Period Weeks    Status New      PT LONG TERM GOAL #3   Title Will be able to carry groceries and perform  housework/cleaning without increase in shoulder pain    Time 6    Period Weeks    Status New      PT LONG TERM GOAL #4   Title Will demonstrate only mild limitations in thoracic mobility in order to reduce shoulder girdle pain/ROM limitations and maximize function    Time 6    Period Weeks    Status New                   Plan - 08/31/21 1338     Clinical Impression Statement R shoulder flexion and abduction are most difficult. Cues to hold the stretch with ladder interventions. Postural and arm placement cues needed with standing rows and extensions. Cue needed to relax and not guard with MT. Pt able to tolerated more resistance with AAROM interventions.    Personal Factors and Comorbidities Age;Fitness;Comorbidity 3+;Time since onset of injury/illness/exacerbation    Comorbidities flexion and    Examination-Activity Limitations Bathing;Reach Overhead;Carry;Sleep;Dressing;Toileting;Lift;Hygiene/Grooming    Examination-Participation Restrictions Cleaning;Meal Prep;Community Activity;Driving;Shop;Laundry;Medication Management    Stability/Clinical Decision Making Stable/Uncomplicated    Rehab Potential Good    PT Duration 6 weeks    PT Treatment/Interventions ADLs/Self Care Home Management;Cryotherapy;Electrical Stimulation;Iontophoresis 59m/ml Dexamethasone;Moist Heat;Ultrasound;Functional mobility training;Therapeutic activities;Therapeutic exercise;Neuromuscular re-education;Patient/family education;Passive range of motion;Dry needling;Energy conservation;Taping;Vasopneumatic Device;Joint Manipulations    PT Next Visit Plan per MD, no restrictions/precautions for shoulder so ROM/strength/postural training and functional task training to tolerance             Patient will benefit from skilled therapeutic intervention in order to improve the following deficits and impairments:  Decreased range of motion, Increased fascial restricitons, Impaired UE functional use, Impaired  perceived functional ability, Pain, Improper body mechanics, Decreased strength, Postural dysfunction  Visit Diagnosis: Chronic right shoulder pain  Abnormal posture  Muscle weakness (generalized)  Stiffness of right shoulder, not elsewhere classified     Problem List Patient Active Problem List   Diagnosis Date Noted   S/P arthroscopy of right shoulder 07/30/2021   Atherosclerosis of aorta ([place] 06/04/2021   Senile  purpura ([place]) 06/04/2021   History of COVID-19 06/04/2021   History of shoulder fracture 06/04/2021   Multiple falls 11/29/2019   Ureteral stricture 09/07/2019   Osteopenia 08/28/2019   Spinal stenosis of lumbar region with neurogenic claudication 03/11/2017   History of kidney stones 12/04/2015   Arthritis 12/04/2015   Hypothyroidism 12/04/2015   HYPERCHOLESTEROLEMIA 08/05/2009   Essential hypertension 08/05/2009   GERD 08/05/2009    Scot Jun, PTA 08/31/2021, 1:41 PM  [place]. [place], [place], [address] Phone: [phone]   Fax:  [phone]  Name: Allison [name]: [id number] Date of Birth: [birth year]

## 2021-09-02 ENCOUNTER — Encounter: Payer: Self-pay | Admitting: Physical Therapy

## 2021-09-02 ENCOUNTER — Ambulatory Visit: Payer: PPO | Admitting: Physical Therapy

## 2021-09-02 ENCOUNTER — Other Ambulatory Visit: Payer: Self-pay

## 2021-09-02 DIAGNOSIS — R600 Localized edema: Secondary | ICD-10-CM

## 2021-09-02 DIAGNOSIS — M25611 Stiffness of right shoulder, not elsewhere classified: Secondary | ICD-10-CM

## 2021-09-02 DIAGNOSIS — M25511 Pain in right shoulder: Secondary | ICD-10-CM

## 2021-09-02 DIAGNOSIS — G8929 Other chronic pain: Secondary | ICD-10-CM

## 2021-09-02 DIAGNOSIS — R293 Abnormal posture: Secondary | ICD-10-CM

## 2021-09-02 DIAGNOSIS — M6281 Muscle weakness (generalized): Secondary | ICD-10-CM

## 2021-09-02 NOTE — Therapy (Signed)
[place]. [place], [place], [address] Phone: [phone]   Fax:  [phone]  Physical Therapy Treatment  Patient Details  Name: Allison [name]: [id number] Date of Birth: August 29, 1941 Referring Provider (PT): Erskine Emery   Encounter Date: 09/02/2021   PT End of Session - 09/02/21 1321     Visit Number 6    Number of Visits 13    Date for PT Re-Evaluation 09/30/21    Authorization Type [employer]    PT Start Time 1232    PT Stop Time 2426    PT Time Calculation (min) 45 min    Activity Tolerance Patient tolerated treatment well    Behavior During Therapy [facility] for tasks assessed/performed             Past Medical History:  Diagnosis Date   Arthritis    lower back   Former smoker    GERD (gastroesophageal reflux disease)    History of appendicitis    History of blood transfusion 1960   after delivery of son   History of cataract    History of kidney stones 1999   lithrotripsy   Hypercholesterolemia    Hypertension    Hypothyroid    Iron deficiency anemia    Macular degeneration    bilateral   Pinched nerve    back   Seasonal allergies    RHINITIS   Tachycardia    Wears glasses    reading    Past Surgical History:  Procedure Laterality Date   ABDOMINAL HYSTERECTOMY     APPENDECTOMY     cataract surgery Bilateral 03/2014   polyps   COLONOSCOPY  02/2010   CYSTOSCOPY W/ URETERAL STENT PLACEMENT Right 09/07/2019   Procedure: CYSTOSCOPY WITH STENT REPLACEMENT WITH RETROGRADES;  Surgeon: Alexis Frock, MD;  Location: [facility];  Service: Urology;  Laterality: Right;   CYSTOSCOPY/RETROGRADE/URETEROSCOPY Bilateral 07/18/2019   Procedure: CYSTOSCOPY/RETROGRADE/RIGHT URETEROSCOPY/BLADDER BIOPSY/STENT PLACEMENT/RIGHT URETERAL MASS BIOPSY;  Surgeon: Alexis Frock, MD;  Location: [facility];  Service: Urology;  Laterality: Bilateral;  1 HR    There were no vitals filed  for this visit.   Subjective Assessment - 09/02/21 1234     Subjective It feels probably the best it has felt. I started taking CBD and it appears to help the pain.    Currently in Pain? Yes    Pain Score 3     Pain Location Shoulder    Pain Orientation Right;Lateral    Pain Descriptors / Indicators Sharp    Pain Onset More than a month ago    Pain Frequency Intermittent    Aggravating Factors  Unknown.    Pain Relieving Factors CBD,                               [facility] Adult PT Treatment/Exercise - 09/02/21 0001       Shoulder Exercises: Seated   Flexion Strengthening;Right;10 reps    Abduction Strengthening;Right;10 reps      Shoulder Exercises: Standing   Protraction Strengthening;Both;10 reps;20 reps    Protraction Limitations scapular protraction/retraction against the wall.    Other Standing Exercises Wall slides- flexion, scaption, abd-10 reps each      Shoulder Exercises: ROM/Strengthening   UBE (Upper Arm Bike) L1 x 3 min each      Shoulder Exercises: Stretch   Table Stretch - Flexion 3 reps;20 seconds  Table Stretch - Abduction 3 reps;20 seconds      Manual Therapy   Manual Therapy Joint mobilization;Passive ROM    Joint Mobilization R AP, PA, inferior, 3 x 10, grade II-III    Passive ROM R shoulder in all directions                     PT Education - 09/02/21 1320     Education Details Updated HEP, added strengthening.    Person(s) Educated Patient    Methods Explanation;Demonstration;Handout    Comprehension Verbalized understanding;Returned demonstration              PT Short Term Goals - 09/02/21 1237       PT SHORT TERM GOAL #2   Title 150 active flex (157 PROM), 123 abd AROM (120 PROM)    Time 1    Period Weeks    Status On-going    Target Date 09/09/21      PT SHORT TERM GOAL #3   Title T1, L2    Time 1    Period Weeks    Status On-going    Target Date 09/09/21      PT SHORT TERM GOAL #4   Title  Will be able to reach to shoulder height with pain no more than 4/10 R shoulder    Baseline 3/10    Status Achieved               PT Long Term Goals - 08/19/21 1429       PT LONG TERM GOAL #1   Title R shoulder strength to be at least 4/5 globally in order to reduce pain and improve shoulder function    Time 6    Period Weeks    Status New    Target Date 09/30/21      PT LONG TERM GOAL #2   Title Will be able to reach overhead with no more than 2/10 pain R shoulder in order to improve functional task tolerance    Time 6    Period Weeks    Status New      PT LONG TERM GOAL #3   Title Will be able to carry groceries and perform housework/cleaning without increase in shoulder pain    Time 6    Period Weeks    Status New      PT LONG TERM GOAL #4   Title Will demonstrate only mild limitations in thoracic mobility in order to reduce shoulder girdle pain/ROM limitations and maximize function    Time 6    Period Weeks    Status New                   Plan - 09/02/21 1321     Clinical Impression Statement Flexion and abduction continue to be challenging strength wise, but both ROm and strength improving. Added some strengthening exercises to HEP.    Personal Factors and Comorbidities Age;Fitness;Comorbidity 3+;Time since onset of injury/illness/exacerbation    Comorbidities flexion and    Examination-Activity Limitations Bathing;Reach Overhead;Carry;Sleep;Dressing;Toileting;Lift;Hygiene/Grooming    Examination-Participation Restrictions Cleaning;Meal Prep;Community Activity;Driving;Shop;Laundry;Medication Management    Stability/Clinical Decision Making Stable/Uncomplicated    Rehab Potential Good    PT Frequency 2x / week    PT Duration 6 weeks    PT Treatment/Interventions ADLs/Self Care Home Management;Cryotherapy;Electrical Stimulation;Iontophoresis 4mg /ml Dexamethasone;Moist Heat;Ultrasound;Functional mobility training;Therapeutic activities;Therapeutic  exercise;Neuromuscular re-education;Patient/family education;Passive range of motion;Dry needling;Energy conservation;Taping;Vasopneumatic Device;Joint Manipulations    PT Next Visit Plan per MD,  no restrictions/precautions for shoulder so ROM/strength/postural training and functional task training to tolerance    PT [place] and Agree with Plan of Care Patient             Patient will benefit from skilled therapeutic intervention in order to improve the following deficits and impairments:  Decreased range of motion, Increased fascial restricitons, Impaired UE functional use, Impaired perceived functional ability, Pain, Improper body mechanics, Decreased strength, Postural dysfunction  Visit Diagnosis: Chronic right shoulder pain  Abnormal posture  Muscle weakness (generalized)  Stiffness of right shoulder, not elsewhere classified  Localized edema     Problem List Patient Active Problem List   Diagnosis Date Noted   S/P arthroscopy of right shoulder 07/30/2021   Atherosclerosis of aorta ([place]) 06/04/2021   Senile purpura ([place]) 06/04/2021   History of COVID-19 06/04/2021   History of shoulder fracture 06/04/2021   Multiple falls 11/29/2019   Ureteral stricture 09/07/2019   Osteopenia 08/28/2019   Spinal stenosis of lumbar region with neurogenic claudication 03/11/2017   History of kidney stones 12/04/2015   Arthritis 12/04/2015   Hypothyroidism 12/04/2015   HYPERCHOLESTEROLEMIA 08/05/2009   Essential hypertension 08/05/2009   GERD 08/05/2009    Marcelina Morel, DPT 09/02/2021, 1:24 PM  [place]. [place], [place], [address] Phone: [phone]   Fax:  [phone]  Name: Allison [name]: [id number] Date of Birth: 1941-06-19

## 2021-09-02 NOTE — Patient Instructions (Signed)
Access Code: [email or web address] URL: [email or web address] Date: 09/02/2021 Prepared by: Ethel Rana  Exercises Supine Shoulder Flexion Overhead with Dowel - 2-3 x daily - 7 x weekly - 1 sets - 10 reps Supine Shoulder Abduction AAROM with Dowel - 2-3 x daily - 7 x weekly - 1 sets - 10 reps Supine Single Arm Shoulder Protraction - 2-3 x daily - 7 x weekly - 1 sets - 10 reps Seated Scapular Retraction - 2-3 x daily - 7 x weekly - 1 sets - 10 reps - 2 hold Seated Thoracic Extension AROM - 2-3 x daily - 7 x weekly - 1 sets - 10 reps - 3 hold Standing Shoulder Posterior Capsule Stretch - 1 x daily - 7 x weekly - 3 reps - 30 hold Standing Shoulder Row with Anchored Resistance - 1 x daily - 7 x weekly - 2 sets - 10 reps Shoulder External Rotation and Scapular Retraction with Resistance - 1 x daily - 7 x weekly - 2 sets - 10 reps Supine Shoulder External Rotation with Dowel - 2-3 x daily - 7 x weekly - 1 sets - 10 reps Shoulder extension with resistance - Neutral - 1 x daily - 7 x weekly - 2 sets - 10 reps Shoulder Flexion Wall Slide with Towel - 1 x daily - 7 x weekly - 2 sets - 10 reps Supine Shoulder External Internal Rotation AAROM with Dowel - 1 x daily - 7 x weekly - 3 sets - 10 reps Standing Shoulder Abduction Slides at Wall - 1 x daily - 7 x weekly - 2 sets - 10 reps Seated Single Arm Shoulder Flexion - 1 x daily - 7 x weekly - 2 sets - 10 reps Seated Single Arm Shoulder Horizontal Abduction - Thumb Up - 1 x daily - 7 x weekly - 2 sets - 10 reps

## 2021-09-04 ENCOUNTER — Other Ambulatory Visit: Payer: Self-pay

## 2021-09-04 ENCOUNTER — Ambulatory Visit: Payer: PPO | Admitting: Physical Therapy

## 2021-09-04 DIAGNOSIS — M25611 Stiffness of right shoulder, not elsewhere classified: Secondary | ICD-10-CM

## 2021-09-04 DIAGNOSIS — R293 Abnormal posture: Secondary | ICD-10-CM

## 2021-09-04 DIAGNOSIS — R600 Localized edema: Secondary | ICD-10-CM

## 2021-09-04 DIAGNOSIS — M6281 Muscle weakness (generalized): Secondary | ICD-10-CM

## 2021-09-04 DIAGNOSIS — G8929 Other chronic pain: Secondary | ICD-10-CM

## 2021-09-04 DIAGNOSIS — M25511 Pain in right shoulder: Secondary | ICD-10-CM

## 2021-09-04 NOTE — Therapy (Signed)
[name]. [name], [place], [address] Phone: [phone]   Fax:  [phone]  Physical Therapy Treatment  Patient Details  Name: Allison [name]: [id number] Date of Birth: Mar 19, 1941 Referring Provider (PT): Erskine Emery   Encounter Date: 09/04/2021   PT End of Session - 09/04/21 1139     Visit Number 7    Number of Visits 13    Date for PT Re-Evaluation 09/30/21    Authorization Type [employer]    PT Start Time 1100    PT Stop Time 1150    PT Time Calculation (min) 50 min    Activity Tolerance Patient tolerated treatment well    Behavior During Therapy [facility] for tasks assessed/performed             Past Medical History:  Diagnosis Date   Arthritis    lower back   Former smoker    GERD (gastroesophageal reflux disease)    History of appendicitis    History of blood transfusion 1960   after delivery of son   History of cataract    History of kidney stones 1999   lithrotripsy   Hypercholesterolemia    Hypertension    Hypothyroid    Iron deficiency anemia    Macular degeneration    bilateral   Pinched nerve    back   Seasonal allergies    RHINITIS   Tachycardia    Wears glasses    reading    Past Surgical History:  Procedure Laterality Date   ABDOMINAL HYSTERECTOMY     APPENDECTOMY     cataract surgery Bilateral 03/2014   polyps   COLONOSCOPY  02/2010   CYSTOSCOPY W/ URETERAL STENT PLACEMENT Right 09/07/2019   Procedure: CYSTOSCOPY WITH STENT REPLACEMENT WITH RETROGRADES;  Surgeon: Alexis Frock, MD;  Location: [facility];  Service: Urology;  Laterality: Right;   CYSTOSCOPY/RETROGRADE/URETEROSCOPY Bilateral 07/18/2019   Procedure: CYSTOSCOPY/RETROGRADE/RIGHT URETEROSCOPY/BLADDER BIOPSY/STENT PLACEMENT/RIGHT URETERAL MASS BIOPSY;  Surgeon: Alexis Frock, MD;  Location: [facility];  Service: Urology;  Laterality: Bilateral;  1 HR    There were no vitals  filed for this visit.   Subjective Assessment - 09/04/21 1109     Subjective "I am feeling fine"    Currently in Pain? No/denies                               [facility] Adult PT Treatment/Exercise - 09/04/21 0001       Shoulder Exercises: Standing   External Rotation Strengthening;Right;20 reps;Theraband    Theraband Level (Shoulder External Rotation) Level 1 (Yellow)    Internal Rotation Strengthening;Right;Theraband;20 reps    Theraband Level (Shoulder Internal Rotation) Level 1 (Yellow)    Flexion Strengthening;Both;20 reps;Weights    Shoulder Flexion Weight (lbs) 1    ABduction AROM;Both;5 reps   x3   Row Strengthening;Both;Weights;20 reps    Theraband Level (Shoulder Row) Level 2 (Red)      Shoulder Exercises: ROM/Strengthening   UBE (Upper Arm Bike) L1 x 3 min each    Other ROM/Strengthening Exercises Flex & Abd up wall pillow case x15      Manual Therapy   Manual Therapy Joint mobilization;Passive ROM    Passive ROM R shoulder in all directions                       PT Short Term Goals - 09/02/21 1237  PT SHORT TERM GOAL #2   Title 150 active flex (157 PROM), 123 abd AROM (120 PROM)    Time 1    Period Weeks    Status On-going    Target Date 09/09/21      PT SHORT TERM GOAL #3   Title T1, L2    Time 1    Period Weeks    Status On-going    Target Date 09/09/21      PT SHORT TERM GOAL #4   Title Will be able to reach to shoulder height with pain no more than 4/10 R shoulder    Baseline 3/10    Status Achieved               PT Long Term Goals - 08/19/21 1429       PT LONG TERM GOAL #1   Title R shoulder strength to be at least 4/5 globally in order to reduce pain and improve shoulder function    Time 6    Period Weeks    Status New    Target Date 09/30/21      PT LONG TERM GOAL #2   Title Will be able to reach overhead with no more than 2/10 pain R shoulder in order to improve functional task tolerance     Time 6    Period Weeks    Status New      PT LONG TERM GOAL #3   Title Will be able to carry groceries and perform housework/cleaning without increase in shoulder pain    Time 6    Period Weeks    Status New      PT LONG TERM GOAL #4   Title Will demonstrate only mild limitations in thoracic mobility in order to reduce shoulder girdle pain/ROM limitations and maximize function    Time 6    Period Weeks    Status New                   Plan - 09/04/21 1143     Clinical Impression Statement Pt able to complete the interventions. Cues for pacing with wall flexion needed to get pt to slow down. Tactile cues to elbow needed with internal and external rotation for positioning. R shoulder elevation noted with flexion and abduction. Some pain and discomfort with standing active abduction.    Personal Factors and Comorbidities Age;Fitness;Comorbidity 3+;Time since onset of injury/illness/exacerbation    Examination-Activity Limitations Bathing;Reach Overhead;Carry;Sleep;Dressing;Toileting;Lift;Hygiene/Grooming    Examination-Participation Restrictions Cleaning;Meal Prep;Community Activity;Driving;Shop;Laundry;Medication Management    Stability/Clinical Decision Making Stable/Uncomplicated    Rehab Potential Good    PT Duration 6 weeks    PT Treatment/Interventions ADLs/Self Care Home Management;Cryotherapy;Electrical Stimulation;Iontophoresis 4mg /ml Dexamethasone;Moist Heat;Ultrasound;Functional mobility training;Therapeutic activities;Therapeutic exercise;Neuromuscular re-education;Patient/family education;Passive range of motion;Dry needling;Energy conservation;Taping;Vasopneumatic Device;Joint Manipulations    PT Next Visit Plan per MD, no restrictions/precautions for shoulder so ROM/strength/postural training and functional task training to tolerance             Patient will benefit from skilled therapeutic intervention in order to improve the following deficits and  impairments:  Decreased range of motion, Increased fascial restricitons, Impaired UE functional use, Impaired perceived functional ability, Pain, Improper body mechanics, Decreased strength, Postural dysfunction  Visit Diagnosis: Chronic right shoulder pain  Muscle weakness (generalized)  Stiffness of right shoulder, not elsewhere classified  Abnormal posture  Localized edema     Problem List Patient Active Problem List   Diagnosis Date Noted   S/P arthroscopy of right shoulder  07/30/2021   Atherosclerosis of aorta ([place]) 06/04/2021   Senile purpura ([place]) 06/04/2021   History of COVID-19 06/04/2021   History of shoulder fracture 06/04/2021   Multiple falls 11/29/2019   Ureteral stricture 09/07/2019   Osteopenia 08/28/2019   Spinal stenosis of lumbar region with neurogenic claudication 03/11/2017   History of kidney stones 12/04/2015   Arthritis 12/04/2015   Hypothyroidism 12/04/2015   HYPERCHOLESTEROLEMIA 08/05/2009   Essential hypertension 08/05/2009   GERD 08/05/2009    Scot Jun, PTA 09/04/2021, 11:46 AM  [place]. [place], [place], [address] Phone: [phone]   Fax:  [phone]  Name: KALIANA ALBINO MRN: [id number] Date of Birth: 01-23-1941

## 2021-09-07 ENCOUNTER — Encounter: Payer: Self-pay | Admitting: Physical Therapy

## 2021-09-07 ENCOUNTER — Other Ambulatory Visit: Payer: Self-pay

## 2021-09-07 ENCOUNTER — Ambulatory Visit: Payer: PPO | Admitting: Physical Therapy

## 2021-09-07 DIAGNOSIS — M6281 Muscle weakness (generalized): Secondary | ICD-10-CM

## 2021-09-07 DIAGNOSIS — M25511 Pain in right shoulder: Secondary | ICD-10-CM | POA: Diagnosis not present

## 2021-09-07 DIAGNOSIS — R293 Abnormal posture: Secondary | ICD-10-CM

## 2021-09-07 DIAGNOSIS — R600 Localized edema: Secondary | ICD-10-CM

## 2021-09-07 DIAGNOSIS — G8929 Other chronic pain: Secondary | ICD-10-CM

## 2021-09-07 DIAGNOSIS — M25611 Stiffness of right shoulder, not elsewhere classified: Secondary | ICD-10-CM

## 2021-09-07 NOTE — Therapy (Signed)
[place]. [place], [place], [address] Phone: [phone]   Fax:  [phone]  Physical Therapy Treatment  Patient Details  Name: Allison [name]: [id number] Date of Birth: Mar 19, 1941 Referring Provider (PT): Erskine Emery   Encounter Date: 09/07/2021   PT End of Session - 09/07/21 1314     Visit Number 8    Number of Visits 13    Date for PT Re-Evaluation 09/30/21    Authorization Type [employer]    PT Start Time 5093    PT Stop Time 1300    PT Time Calculation (min) 30 min    Activity Tolerance Patient tolerated treatment well    Behavior During Therapy [facility] for tasks assessed/performed             Past Medical History:  Diagnosis Date   Arthritis    lower back   Former smoker    GERD (gastroesophageal reflux disease)    History of appendicitis    History of blood transfusion 1960   after delivery of son   History of cataract    History of kidney stones 1999   lithrotripsy   Hypercholesterolemia    Hypertension    Hypothyroid    Iron deficiency anemia    Macular degeneration    bilateral   Pinched nerve    back   Seasonal allergies    RHINITIS   Tachycardia    Wears glasses    reading    Past Surgical History:  Procedure Laterality Date   ABDOMINAL HYSTERECTOMY     APPENDECTOMY     cataract surgery Bilateral 03/2014   polyps   COLONOSCOPY  02/2010   CYSTOSCOPY W/ URETERAL STENT PLACEMENT Right 09/07/2019   Procedure: CYSTOSCOPY WITH STENT REPLACEMENT WITH RETROGRADES;  Surgeon: Alexis Frock, MD;  Location: [facility];  Service: Urology;  Laterality: Right;   CYSTOSCOPY/RETROGRADE/URETEROSCOPY Bilateral 07/18/2019   Procedure: CYSTOSCOPY/RETROGRADE/RIGHT URETEROSCOPY/BLADDER BIOPSY/STENT PLACEMENT/RIGHT URETERAL MASS BIOPSY;  Surgeon: Alexis Frock, MD;  Location: [facility];  Service: Urology;  Laterality: Bilateral;  1 HR    There were no vitals  filed for this visit.   Subjective Assessment - 09/07/21 1237     Subjective the shoulder is more achy today, possibly due to cold weather. Performing HEP, with some diffiuclty on strengthening exercises, but improving.    Patient Stated Goals get movement back in arm, be able to reach overhead, less pain    Currently in Pain? Yes    Pain Score 3     Pain Location Shoulder    Pain Orientation Right    Pain Descriptors / Indicators Aching    Pain Type Surgical pain    Pain Onset More than a month ago    Pain Frequency Intermittent    Aggravating Factors  cold                [facility] PT Assessment - 09/07/21 0001       AROM   Right Shoulder Flexion 149 Degrees    Right Shoulder ABduction 111 Degrees    Left Shoulder External Rotation 85 Degrees                           [facility] Adult PT Treatment/Exercise - 09/07/21 0001       Shoulder Exercises: Standing   Other Standing Exercises Cabinet reaches with 1# weight, 5 flex, 5 abd without weight  Shoulder Exercises: Pulleys   Flexion 1 minute    Scaption 1 minute    ABduction 1 minute      Shoulder Exercises: ROM/Strengthening   UBE (Upper Arm Bike) L1.2 x 3 min each      Modalities   Modalities Vasopneumatic      Vasopneumatic   Number Minutes Vasopneumatic  15 minutes    Vasopnuematic Location  Shoulder    Vasopneumatic Pressure Low    Vasopneumatic Temperature  45      Manual Therapy   Manual Therapy Joint mobilization;Passive ROM    Joint Mobilization R AP, PA, inferior, 3 x 10, grade II-III    Passive ROM R shoulder in all directions                     PT Education - 09/07/21 1246     Education Details Continue program as tolerated for strength and ROM    Person(s) Educated Patient    Methods Explanation    Comprehension Verbalized understanding              PT Short Term Goals - 09/07/21 1319       PT SHORT TERM GOAL #1   Title Will be independent with initial  HEP    Status Achieved      PT SHORT TERM GOAL #2   Title 150 active flex (157 PROM), 123 abd AROM (120 PROM)    Baseline PROM 149 flexion, 111 abduction    Time 1    Period Weeks    Status On-going    Target Date 09/16/21      PT SHORT TERM GOAL #3   Title T1, L2    Baseline ER WNL    Time 1    Period Weeks    Status On-going    Target Date 09/16/21      PT SHORT TERM GOAL #4   Title Will be able to reach to shoulder height with pain no more than 4/10 R shoulder    Baseline 3/10    Status Achieved               PT Long Term Goals - 08/19/21 1429       PT LONG TERM GOAL #1   Title R shoulder strength to be at least 4/5 globally in order to reduce pain and improve shoulder function    Time 6    Period Weeks    Status New    Target Date 09/30/21      PT LONG TERM GOAL #2   Title Will be able to reach overhead with no more than 2/10 pain R shoulder in order to improve functional task tolerance    Time 6    Period Weeks    Status New      PT LONG TERM GOAL #3   Title Will be able to carry groceries and perform housework/cleaning without increase in shoulder pain    Time 6    Period Weeks    Status New      PT LONG TERM GOAL #4   Title Will demonstrate only mild limitations in thoracic mobility in order to reduce shoulder girdle pain/ROM limitations and maximize function    Time 6    Period Weeks    Status New                   Plan - 09/07/21 1315     Clinical Impression Statement {Patient demosntrates  improved ROM in all planes. Tolerated all treatment activities well, including adding cabinet reaches with 1# weight-no weight for abd. Progressing toward LTG.    Personal Factors and Comorbidities Age;Fitness;Comorbidity 3+;Time since onset of injury/illness/exacerbation    Examination-Activity Limitations Bathing;Reach Overhead;Carry;Sleep;Dressing;Toileting;Lift;Hygiene/Grooming    Examination-Participation Restrictions Cleaning;Meal  Prep;Community Activity;Driving;Shop;Laundry;Medication Management    Stability/Clinical Decision Making Stable/Uncomplicated    Clinical Decision Making Low    Rehab Potential Good    PT Frequency 2x / week    PT Duration 6 weeks    PT Treatment/Interventions ADLs/Self Care Home Management;Cryotherapy;Electrical Stimulation;Iontophoresis 4mg /ml Dexamethasone;Moist Heat;Ultrasound;Functional mobility training;Therapeutic activities;Therapeutic exercise;Neuromuscular re-education;Patient/family education;Passive range of motion;Dry needling;Energy conservation;Taping;Vasopneumatic Device;Joint Manipulations    PT Next Visit Plan Per MD, no restrictions/precautions for shoulder. Progress with both as tolerated, in all planes    Consulted and Agree with Plan of Care Patient             Patient will benefit from skilled therapeutic intervention in order to improve the following deficits and impairments:  Decreased range of motion, Increased fascial restricitons, Impaired UE functional use, Impaired perceived functional ability, Pain, Improper body mechanics, Decreased strength, Postural dysfunction  Visit Diagnosis: Chronic right shoulder pain  Muscle weakness (generalized)  Stiffness of right shoulder, not elsewhere classified  Abnormal posture  Localized edema     Problem List Patient Active Problem List   Diagnosis Date Noted   S/P arthroscopy of right shoulder 07/30/2021   Atherosclerosis of aorta ([place]) 06/04/2021   Senile purpura ([place]) 06/04/2021   History of COVID-19 06/04/2021   History of shoulder fracture 06/04/2021   Multiple falls 11/29/2019   Ureteral stricture 09/07/2019   Osteopenia 08/28/2019   Spinal stenosis of lumbar region with neurogenic claudication 03/11/2017   History of kidney stones 12/04/2015   Arthritis 12/04/2015   Hypothyroidism 12/04/2015   HYPERCHOLESTEROLEMIA 08/05/2009   Essential hypertension 08/05/2009   GERD 08/05/2009    Marcelina Morel, DPT 09/07/2021, 1:28 PM  [place]. [place], [place], [address] Phone: [phone]   Fax:  [phone]  Name: LEIGHA OLBERDING MRN: [id number] Date of Birth: Jan 20, 1941

## 2021-09-09 ENCOUNTER — Ambulatory Visit: Payer: PPO | Admitting: Physical Therapy

## 2021-09-11 ENCOUNTER — Encounter: Payer: Self-pay | Admitting: Physical Therapy

## 2021-09-11 ENCOUNTER — Ambulatory Visit: Payer: PPO | Admitting: Physical Therapy

## 2021-09-11 ENCOUNTER — Other Ambulatory Visit: Payer: Self-pay

## 2021-09-11 DIAGNOSIS — R293 Abnormal posture: Secondary | ICD-10-CM

## 2021-09-11 DIAGNOSIS — M6281 Muscle weakness (generalized): Secondary | ICD-10-CM

## 2021-09-11 DIAGNOSIS — R600 Localized edema: Secondary | ICD-10-CM

## 2021-09-11 DIAGNOSIS — M25611 Stiffness of right shoulder, not elsewhere classified: Secondary | ICD-10-CM

## 2021-09-11 DIAGNOSIS — M25511 Pain in right shoulder: Secondary | ICD-10-CM | POA: Diagnosis not present

## 2021-09-11 DIAGNOSIS — G8929 Other chronic pain: Secondary | ICD-10-CM

## 2021-09-11 NOTE — Therapy (Signed)
[place]. [place], [place], [address] Phone: [phone]   Fax:  [phone]  Physical Therapy Treatment  Patient Details  Name: Allison [name]: [id number] Date of Birth: 17-Mar-1941 Referring Provider (PT): Erskine Emery   Encounter Date: 09/11/2021   PT End of Session - 09/11/21 1132     Visit Number 9    Number of Visits 13    Date for PT Re-Evaluation 09/30/21    Authorization Type [employer]    PT Start Time 1103    PT Stop Time 1148    PT Time Calculation (min) 45 min    Activity Tolerance Patient tolerated treatment well    Behavior During Therapy [facility] for tasks assessed/performed             Past Medical History:  Diagnosis Date   Arthritis    lower back   Former smoker    GERD (gastroesophageal reflux disease)    History of appendicitis    History of blood transfusion 1960   after delivery of son   History of cataract    History of kidney stones 1999   lithrotripsy   Hypercholesterolemia    Hypertension    Hypothyroid    Iron deficiency anemia    Macular degeneration    bilateral   Pinched nerve    back   Seasonal allergies    RHINITIS   Tachycardia    Wears glasses    reading    Past Surgical History:  Procedure Laterality Date   ABDOMINAL HYSTERECTOMY     APPENDECTOMY     cataract surgery Bilateral 03/2014   polyps   COLONOSCOPY  02/2010   CYSTOSCOPY W/ URETERAL STENT PLACEMENT Right 09/07/2019   Procedure: CYSTOSCOPY WITH STENT REPLACEMENT WITH RETROGRADES;  Surgeon: Alexis Frock, MD;  Location: [facility];  Service: Urology;  Laterality: Right;   CYSTOSCOPY/RETROGRADE/URETEROSCOPY Bilateral 07/18/2019   Procedure: CYSTOSCOPY/RETROGRADE/RIGHT URETEROSCOPY/BLADDER BIOPSY/STENT PLACEMENT/RIGHT URETERAL MASS BIOPSY;  Surgeon: Alexis Frock, MD;  Location[facility];  Service: Urology;  Laterality: Bilateral;  1 HR    There were no vitals  filed for this visit.   Subjective Assessment - 09/11/21 1104     Subjective No complaints today. Shoulder felt ok after last treatment.    Patient Stated Goals get movement back in arm, be able to reach overhead, less pain    Currently in Pain? No/denies    Pain Onset More than a month ago                               [facility] Adult PT Treatment/Exercise - 09/11/21 0001       Shoulder Exercises: Standing   Protraction Strengthening;Both;20 reps;Theraband    Theraband Level (Shoulder Protraction) Level 1 (Yellow)    External Rotation Strengthening;Both;20 reps    Theraband Level (Shoulder External Rotation) Level 1 (Yellow)    Retraction Strengthening;Both;20 reps    Theraband Level (Shoulder Retraction) Level 1 (Yellow)    Other Standing Exercises Protraction, pushing small ball against wall, 10 circles in each direction.      Shoulder Exercises: Pulleys   Flexion 2 minutes    Scaption 2 minutes    ABduction 2 minutes      Shoulder Exercises: Stretch   Other Shoulder Stretches wall slides for ROM, flexion amd abd x 10 each.      Manual Therapy   Manual Therapy Joint  mobilization;Passive ROM    Joint Mobilization R AP, PA, inferior, 3 x 10, grade II-III    Passive ROM R shoulder in all directions                     PT Education - 09/11/21 1127     Education Details Continue HEp for strength and ROM, emphasize ashoulder abduction.    Person(s) Educated Patient    Methods Explanation    Comprehension Verbalized understanding              PT Short Term Goals - 09/07/21 1319       PT SHORT TERM GOAL #1   Title Will be independent with initial HEP    Status Achieved      PT SHORT TERM GOAL #2   Title 150 active flex (157 PROM), 123 abd AROM (120 PROM)    Baseline PROM 149 flexion, 111 abduction    Time 1    Period Weeks    Status On-going    Target Date 09/16/21      PT SHORT TERM GOAL #3   Title T1, L2    Baseline ER WNL     Time 1    Period Weeks    Status On-going    Target Date 09/16/21      PT SHORT TERM GOAL #4   Title Will be able to reach to shoulder height with pain no more than 4/10 R shoulder    Baseline 3/10    Status Achieved               PT Long Term Goals - 08/19/21 1429       PT LONG TERM GOAL #1   Title R shoulder strength to be at least 4/5 globally in order to reduce pain and improve shoulder function    Time 6    Period Weeks    Status New    Target Date 09/30/21      PT LONG TERM GOAL #2   Title Will be able to reach overhead with no more than 2/10 pain R shoulder in order to improve functional task tolerance    Time 6    Period Weeks    Status New      PT LONG TERM GOAL #3   Title Will be able to carry groceries and perform housework/cleaning without increase in shoulder pain    Time 6    Period Weeks    Status New      PT LONG TERM GOAL #4   Title Will demonstrate only mild limitations in thoracic mobility in order to reduce shoulder girdle pain/ROM limitations and maximize function    Time 6    Period Weeks    Status New                   Plan - 09/11/21 1114     Clinical Impression Statement Patient's abduction lags slightly from flexion ROM. Encouraged to continue HEP for ROM and strength, emphasizing Abd as this is lagging in ROM.    Personal Factors and Comorbidities Age;Fitness;Comorbidity 3+;Time since onset of injury/illness/exacerbation    Examination-Activity Limitations Bathing;Reach Overhead;Carry;Sleep;Dressing;Toileting;Lift;Hygiene/Grooming    Examination-Participation Restrictions Cleaning;Meal Prep;Community Activity;Driving;Shop;Laundry;Medication Management    Stability/Clinical Decision Making Stable/Uncomplicated    Clinical Decision Making Low    Rehab Potential Good    PT Frequency 2x / week    PT Duration 4 weeks    PT Treatment/Interventions ADLs/Self Care Home Management;Cryotherapy;Dealer  Stimulation;Iontophoresis  4mg /ml Dexamethasone;Moist Heat;Ultrasound;Functional mobility training;Therapeutic activities;Therapeutic exercise;Neuromuscular re-education;Patient/family education;Passive range of motion;Dry needling;Energy conservation;Taping;Vasopneumatic Device;Joint Manipulations    PT Next Visit Plan Continue with current POC. Progress as tolerated. Review HEP in preparation for D/C from PT services.    Consulted and Agree with Plan of Care Patient             Patient will benefit from skilled therapeutic intervention in order to improve the following deficits and impairments:  Decreased range of motion, Increased fascial restricitons, Impaired UE functional use, Impaired perceived functional ability, Pain, Improper body mechanics, Decreased strength, Postural dysfunction  Visit Diagnosis: Chronic right shoulder pain  Muscle weakness (generalized)  Stiffness of right shoulder, not elsewhere classified  Abnormal posture  Localized edema     Problem List Patient Active Problem List   Diagnosis Date Noted   S/P arthroscopy of right shoulder 07/30/2021   Atherosclerosis of aorta ([place]) 06/04/2021   Senile purpura ([place]) 06/04/2021   History of COVID-19 06/04/2021   History of shoulder fracture 06/04/2021   Multiple falls 11/29/2019   Ureteral stricture 09/07/2019   Osteopenia 08/28/2019   Spinal stenosis of lumbar region with neurogenic claudication 03/11/2017   History of kidney stones 12/04/2015   Arthritis 12/04/2015   Hypothyroidism 12/04/2015   HYPERCHOLESTEROLEMIA 08/05/2009   Essential hypertension 08/05/2009   GERD 08/05/2009    Marcelina Morel, DPT 09/11/2021, 11:55 AM  [place]. [place], [place], [address] Phone: [phone]   Fax:  [phone]  Name: Allison [name]: [id number] Date of Birth: Jun 14, 1941

## 2021-09-19 IMAGING — MR MR SHOULDER*R* W/O CM
4 of 5 series · 29 of 40 positions shown · non-contrast
Comparison: 07/02/2020 radiograph

CLINICAL DATA: Right shoulder pain

EXAM:
MRI OF THE RIGHT SHOULDER WITHOUT CONTRAST
TECHNIQUE: Multiplanar, multisequence MR imaging of the shoulder was performed.
No intravenous contrast was administered.

[Series 3: T2 fat-sat · axial · 4.0mm · 0.31mm/px · z∈[-38,+85]mm · 8 of 26 slices shown (1 of 3)]
[im 1/26]
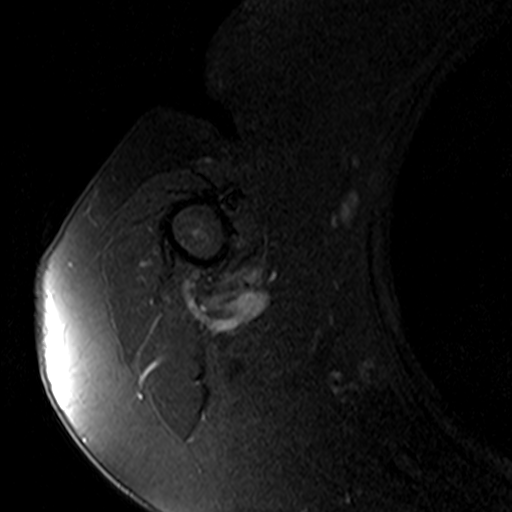
[im 4/26]
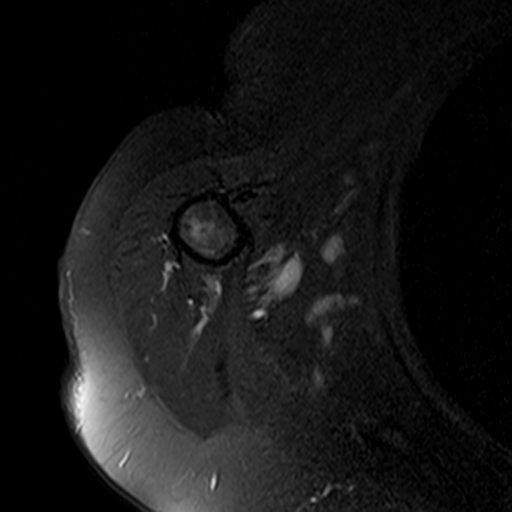
[im 8/26]
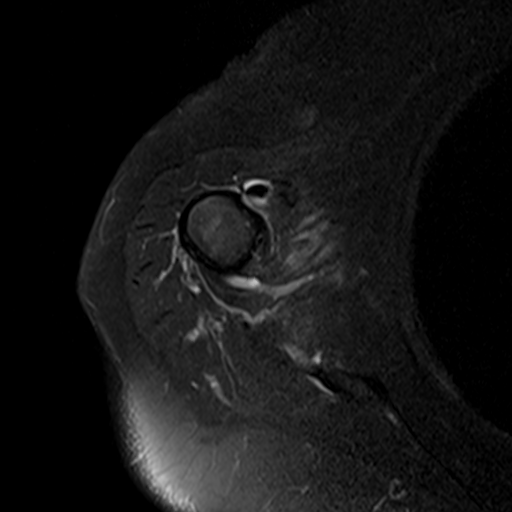
[im 11/26]
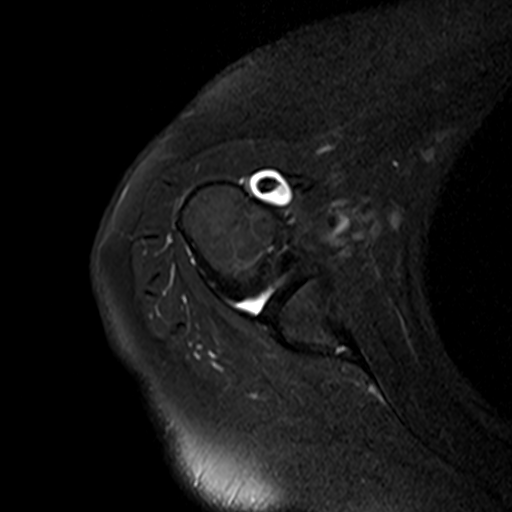
[im 15/26]
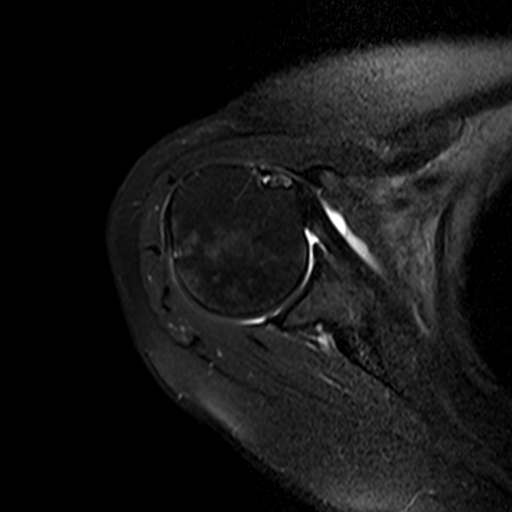
[im 18/26]
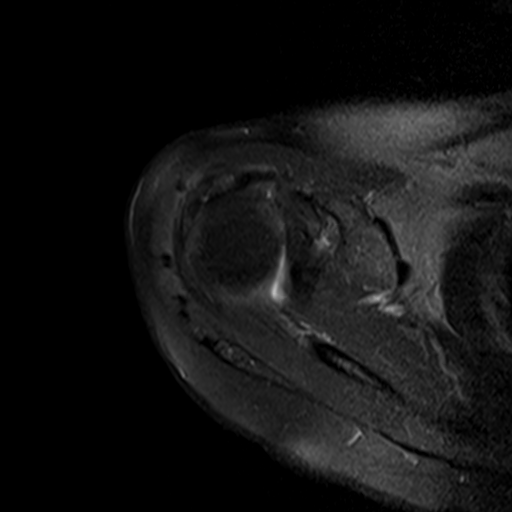
[im 22/26]
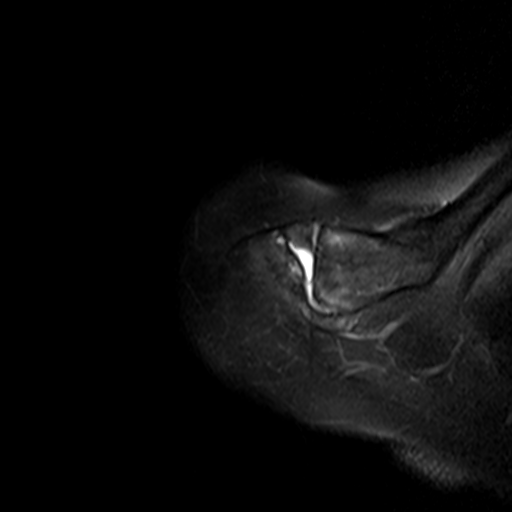
[im 26/26]
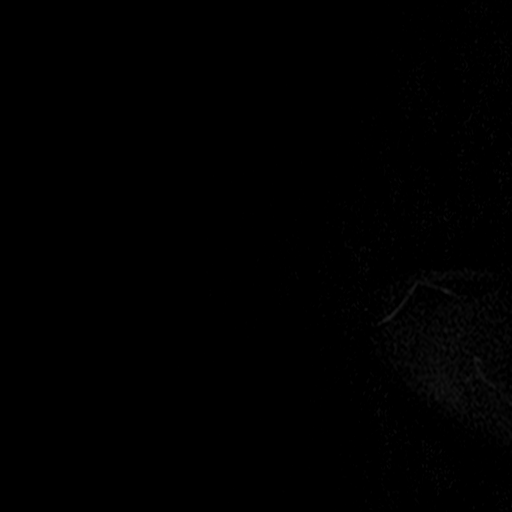

[Series 4: T2 fat-sat · oblique · 4.0mm · 0.59mm/px · 7 of 21 slices shown (2 of 3)]
[im 1/21]
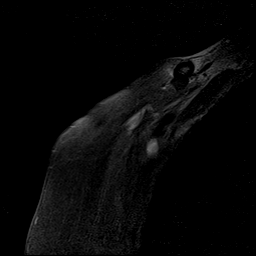
[im 4/21]
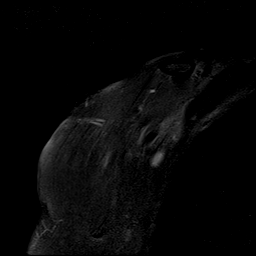
[im 7/21]
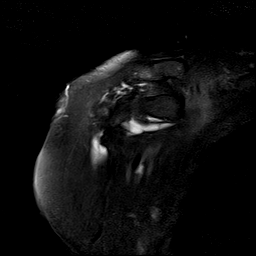
[im 11/21]
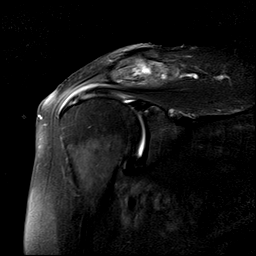
[im 14/21]
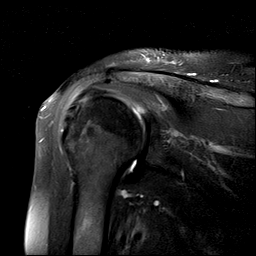
[im 17/21]
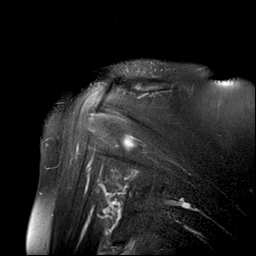
[im 21/21]
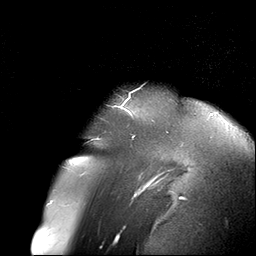

[Series 5: PD · oblique · 4.0mm · 0.29mm/px · 7 of 21 slices shown]
[im 1/21]
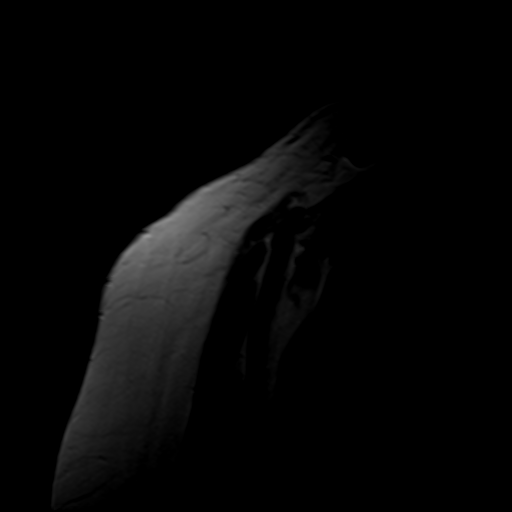
[im 4/21]
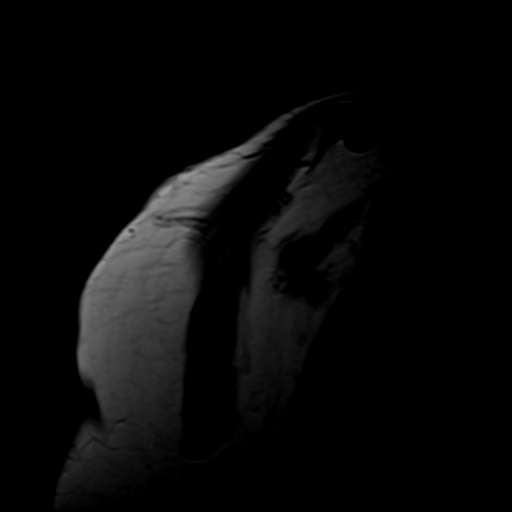
[im 7/21]
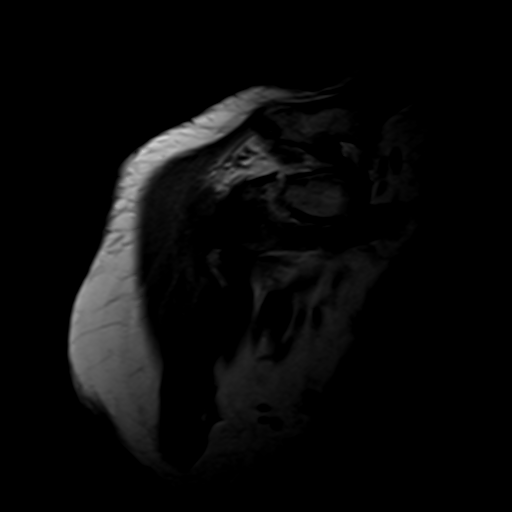
[im 11/21]
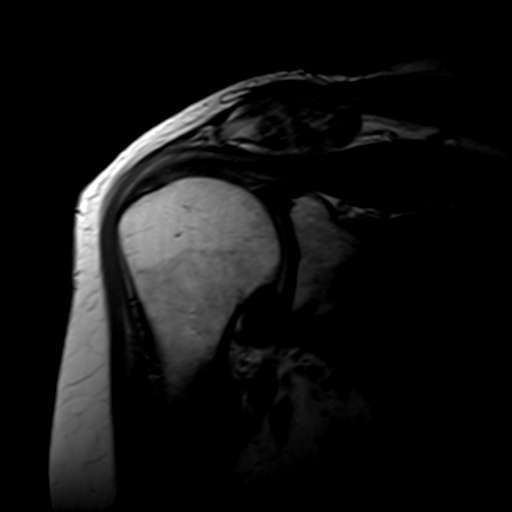
[im 14/21]
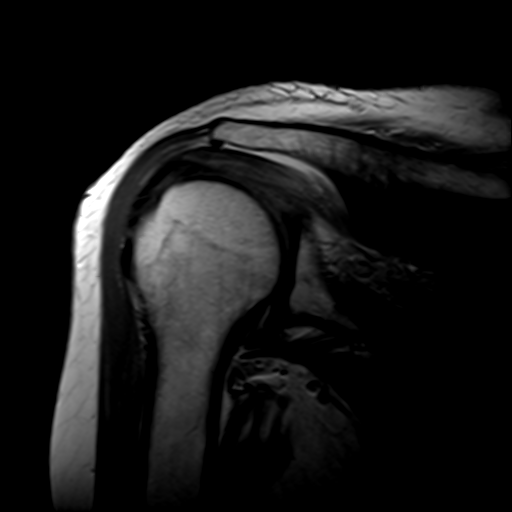
[im 17/21]
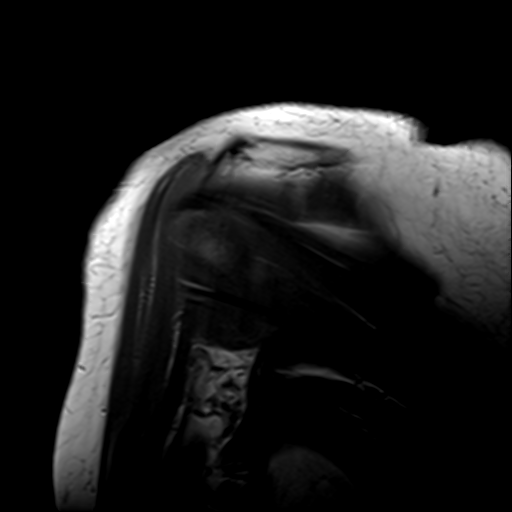
[im 21/21]
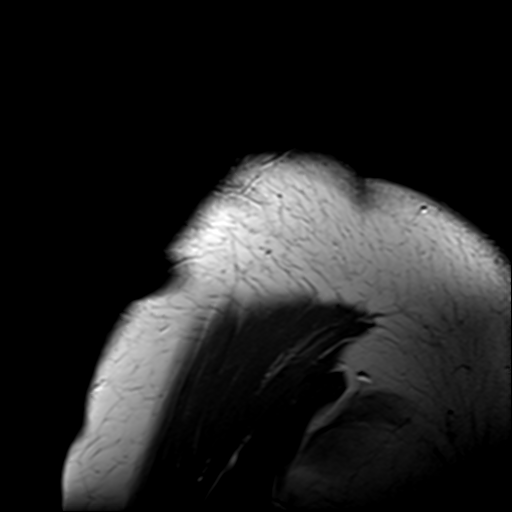

[Series 6: T2 fat-sat · oblique · 4.0mm · 0.55mm/px · 7 of 26 slices shown (3 of 3)]
[im 1/26]
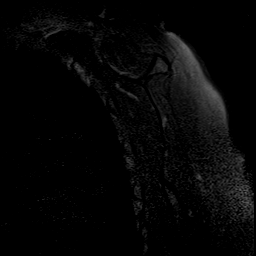
[im 4/26]
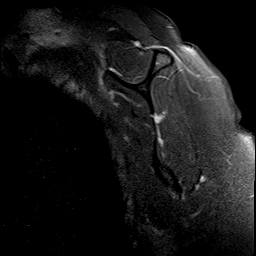
[im 7/26]
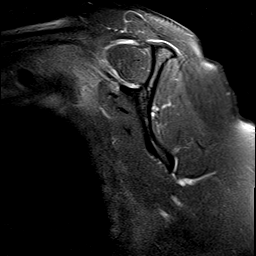
[im 10/26]
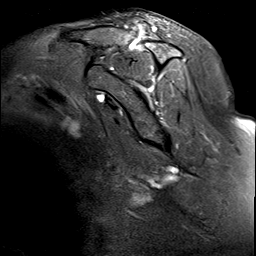
[im 13/26]
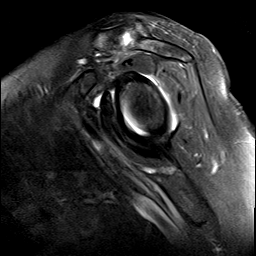
[im 16/26]
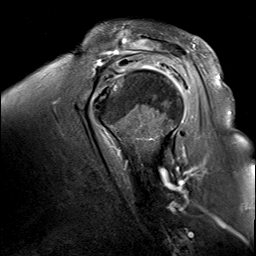
[im 22/26]
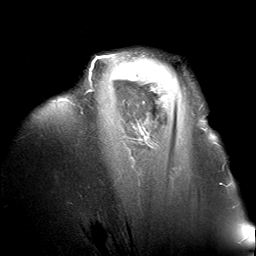

[29 of 40 positions shown; findings below may reference images not displayed]

FINDINGS: Rotator cuff: Partial-thickness articular surface tearing of the
infraspinatus tendon along with intrasubstance partial thickness
tearing of the distal insertion of the supraspinatus tendon.
Moderate supraspinatus and infraspinatus tendinopathy.

Muscles:  Unremarkable

Biceps long head:  Tendinopathy of the intra-articular segment.

Acromioclavicular Joint: Moderate spurring with a small amount of
subcortical marrow edema and fluid within the joint compatible with
moderate degenerative AC joint arthropathy. Type II acromion. Mild
subacromial subdeltoid bursitis.

Glenohumeral Joint: Mild degenerative chondral thinning. Upper
normal amount of fluid in the glenohumeral joint.

Labrum:  Grossly unremarkable

Bones: No significant extra-articular osseous abnormalities
identified.

Other: No supplemental non-categorized findings.
IMPRESSION: 1. Partial-thickness tearing of the supraspinatus and infraspinatus
tendons with associated tendinopathy but no full-thickness rotator
cuff tear is identified.
2. Mild subacromial subdeltoid bursitis.
3. Moderate degenerative AC joint spurring and mild degenerative
glenohumeral arthropathy.
4. Biceps tendinopathy.

## 2021-09-25 ENCOUNTER — Encounter: Payer: Self-pay | Admitting: Physical Therapy

## 2021-09-25 ENCOUNTER — Other Ambulatory Visit: Payer: Self-pay

## 2021-09-25 ENCOUNTER — Ambulatory Visit: Payer: PPO | Attending: Physician Assistant | Admitting: Physical Therapy

## 2021-09-25 DIAGNOSIS — M25611 Stiffness of right shoulder, not elsewhere classified: Secondary | ICD-10-CM

## 2021-09-25 DIAGNOSIS — R293 Abnormal posture: Secondary | ICD-10-CM | POA: Diagnosis not present

## 2021-09-25 DIAGNOSIS — M6281 Muscle weakness (generalized): Secondary | ICD-10-CM | POA: Diagnosis not present

## 2021-09-25 DIAGNOSIS — R600 Localized edema: Secondary | ICD-10-CM

## 2021-09-25 DIAGNOSIS — G8929 Other chronic pain: Secondary | ICD-10-CM | POA: Diagnosis not present

## 2021-09-25 DIAGNOSIS — M25511 Pain in right shoulder: Secondary | ICD-10-CM | POA: Insufficient documentation

## 2021-09-25 NOTE — Therapy (Signed)
[place]. [place], [place], [address] Phone: [phone]   Fax:  [phone]  September 25, 2021   No Recipients  Physical Therapy Discharge Summary  Patient: Allison [name]  MRN: [id number]  Date of Birth: Aug 17, 1941   Diagnosis: Chronic right shoulder pain  Muscle weakness (generalized)  Stiffness of right shoulder, not elsewhere classified  Abnormal posture  Localized edema Referring Provider (PT): Erskine Emery   The above patient had been seen in Physical Therapy 10 times of 10 treatments scheduled with 0 no shows and 0 cancellations.  The treatment consisted of Strength, ROM for R shoulder The patient is: Improved  Subjective: Patient reports notable increase in strength and ROM of R shoulder. Crepitus and pain do remain.  Discharge Findings: Patient with improved strength and ROM of R shoulder. She remains limited by crepitus and pain, but has the tools to maintain or hopefully continue to improve her shoulder function.  Functional Status at Discharge: See objective data.  Goals Partially Met   Plan - 09/25/21 1149     Clinical Impression Statement Patient feels she is ready to continue her progress on her own. She notes a large improvement since starting therapy. HEP updated to thoroughly address remaining strength and ROM limitations.    Personal Factors and Comorbidities Age;Fitness;Comorbidity 3+;Time since onset of injury/illness/exacerbation    Examination-Activity Limitations Bathing;Reach Overhead;Carry;Sleep;Dressing;Toileting;Lift;Hygiene/Grooming    Examination-Participation Restrictions Cleaning;Meal Prep;Community Activity;Driving;Shop;Laundry;Medication Management    Stability/Clinical Decision Making Stable/Uncomplicated    Rehab Potential Good    PT Frequency 2x / week    PT Duration 4 weeks    PT Treatment/Interventions ADLs/Self Care Home Management;Cryotherapy;Electrical  Stimulation;Iontophoresis 51m/ml Dexamethasone;Moist Heat;Ultrasound;Functional mobility training;Therapeutic activities;Therapeutic exercise;Neuromuscular re-education;Patient/family education;Passive range of motion;Dry needling;Energy conservation;Taping;Vasopneumatic Device;Joint Manipulations    PT Next Visit Plan Continue with current POC. Progress as tolerated. Review HEP in preparation for D/C from PT services.    PT Home Exercise Plan [place] Agree with Plan of Care Patient             Sincerely,   SMarcelina Morel DPT   CC No Recipients  [place] [address]: [phone]  Fax:  [phone] Patient: Allison [name]: [id number] Date of Birth: [birth year]

## 2021-09-25 NOTE — Patient Instructions (Signed)
Access Code: [email or web address] URL: [email or web address] Date: 09/25/2021 Prepared by: Ethel Rana  Exercises Supine Single Arm Shoulder Protraction - 2-3 x daily - 7 x weekly - 1 sets - 10 reps Seated Scapular Retraction - 2-3 x daily - 7 x weekly - 1 sets - 10 reps - 2 hold Seated Thoracic Extension AROM - 2-3 x daily - 7 x weekly - 1 sets - 10 reps - 3 hold Standing Shoulder Posterior Capsule Stretch - 1 x daily - 7 x weekly - 3 reps - 30 hold Standing Shoulder Row with Anchored Resistance - 1 x daily - 7 x weekly - 2 sets - 10 reps Shoulder External Rotation and Scapular Retraction with Resistance - 1 x daily - 7 x weekly - 2 sets - 10 reps Shoulder extension with resistance - Neutral - 1 x daily - 7 x weekly - 2 sets - 10 reps Shoulder Flexion Wall Slide with Towel - 1 x daily - 7 x weekly - 2 sets - 10 reps Standing Shoulder Abduction Slides at Wall - 1 x daily - 7 x weekly - 2 sets - 10 reps Seated Single Arm Shoulder Flexion - 1 x daily - 7 x weekly - 2 sets - 10 reps Seated Single Arm Shoulder Horizontal Abduction - Thumb Up - 1 x daily - 7 x weekly - 2 sets - 10 reps Standing Shoulder Internal Rotation Stretch with Towel - 1 x daily - 7 x weekly - 3 sets - 10 reps Seated Overhead Shoulder External Rotation Stretch with Towel - 1 x daily - 7 x weekly - 3 reps Doorway Pec Stretch at 90 Degrees Abduction - 1 x daily - 7 x weekly - 3 reps

## 2021-10-02 ENCOUNTER — Ambulatory Visit: Payer: PPO | Admitting: Physical Therapy

## 2021-10-08 ENCOUNTER — Ambulatory Visit: Payer: PPO | Admitting: Orthopaedic Surgery

## 2021-10-08 ENCOUNTER — Encounter: Payer: Self-pay | Admitting: Orthopaedic Surgery

## 2021-10-08 ENCOUNTER — Other Ambulatory Visit: Payer: Self-pay

## 2021-10-08 DIAGNOSIS — M7541 Impingement syndrome of right shoulder: Secondary | ICD-10-CM

## 2021-10-08 DIAGNOSIS — G8929 Other chronic pain: Secondary | ICD-10-CM

## 2021-10-08 DIAGNOSIS — M25511 Pain in right shoulder: Secondary | ICD-10-CM | POA: Diagnosis not present

## 2021-10-08 DIAGNOSIS — Z9889 Other specified postprocedural states: Secondary | ICD-10-CM

## 2021-10-08 NOTE — Progress Notes (Signed)
The patient is now 14 weeks status post a right shoulder arthroscopy with extensive debridement and subacromial decompression.  She is 80 years old.  She reports that she has much better than she was at her last visit.  She is been released from therapy.  She does have some mild pain but not really in the shoulder.  Its more distal in her arm but overall she is very pleased.  She is a very active 80 year old female.  On exam she has fluid range of motion of her right shoulder and she can reach overhead and behind her well.  She has good motion of the elbow as well.  Some of her pain I think is just postoperative pain that will hopefully slowly improve with time.  This point since she is doing so well follow-up can be as needed.  She will continue her home exercise program.  All questions and concerns were answered and addressed.

## 2021-10-27 DIAGNOSIS — Z1231 Encounter for screening mammogram for malignant neoplasm of breast: Secondary | ICD-10-CM | POA: Diagnosis not present

## 2021-10-27 DIAGNOSIS — Z780 Asymptomatic menopausal state: Secondary | ICD-10-CM | POA: Diagnosis not present

## 2021-10-27 DIAGNOSIS — M85852 Other specified disorders of bone density and structure, left thigh: Secondary | ICD-10-CM | POA: Diagnosis not present

## 2021-10-27 LAB — HM MAMMOGRAPHY

## 2021-10-27 LAB — HM DEXA SCAN

## 2021-11-04 ENCOUNTER — Telehealth: Payer: Self-pay

## 2021-11-04 NOTE — Telephone Encounter (Signed)
Pt was advised of bone density reults and per JCL to take calcium and vitamin D. [place]

## 2021-11-17 ENCOUNTER — Other Ambulatory Visit: Payer: Self-pay | Admitting: Family Medicine

## 2021-11-17 DIAGNOSIS — K219 Gastro-esophageal reflux disease without esophagitis: Secondary | ICD-10-CM

## 2021-12-21 ENCOUNTER — Telehealth: Payer: Self-pay

## 2021-12-21 NOTE — Telephone Encounter (Signed)
Called pt and informed

## 2021-12-21 NOTE — Telephone Encounter (Signed)
Pt called and states [facility] is telling her they have no refills on her Levothyroxine.  I called [facility] they were able to find the refills and placed the order # [id number] rx# [id number] they will try and expedite for pt.  Tried to call patient but no answer & unable to leave message.  Patient needs to call [facility] with payment info for them to ship.

## 2022-05-04 ENCOUNTER — Ambulatory Visit (INDEPENDENT_AMBULATORY_CARE_PROVIDER_SITE_OTHER): Payer: PPO | Admitting: Family Medicine

## 2022-05-04 VITALS — BP 140/80 | HR 60 | Temp 97.5°F | Ht 66.0 in | Wt 146.6 lb

## 2022-05-04 DIAGNOSIS — Z8616 Personal history of COVID-19: Secondary | ICD-10-CM | POA: Diagnosis not present

## 2022-05-04 DIAGNOSIS — D692 Other nonthrombocytopenic purpura: Secondary | ICD-10-CM | POA: Diagnosis not present

## 2022-05-04 DIAGNOSIS — E039 Hypothyroidism, unspecified: Secondary | ICD-10-CM | POA: Diagnosis not present

## 2022-05-04 DIAGNOSIS — E7800 Pure hypercholesterolemia, unspecified: Secondary | ICD-10-CM | POA: Diagnosis not present

## 2022-05-04 DIAGNOSIS — R413 Other amnesia: Secondary | ICD-10-CM

## 2022-05-04 NOTE — Progress Notes (Signed)
   Subjective:    Patient ID: Allison [name], female    DOB: Sep 08, 1941, 81 y.o.   MRN: [id number]  HPI She is here to discuss memory loss.  She states that she had COVID in January 2021 and since then has noted memory deficit.  She is very forgetful and now has 4 lists and sometimes forgets where these lists are.  She has noted difficulty sometimes with driving forgetting where she is going.  Presently she is taking thyroid medication.  She did bring information concerning starting on Aricept.   Review of Systems     Objective:   Physical Exam Alert and in no distress. Tympanic membranes and canals are normal. Pharyngeal area is normal. Neck is supple without adenopathy or thyromegaly. Cardiac exam shows a regular sinus rhythm without murmurs or gallops. Lungs are clear to auscultation.  DTRs are normal.  Skin is normal.  MMSE is 29        Assessment & Plan:  Memory changes - Plan: CBC with Differential/Platelet, Comprehensive metabolic panel, TSH, Vitamin B12, Folate  HYPERCHOLESTEROLEMIA  Hypothyroidism, unspecified type - Plan: TSH  Senile purpura ([place])  History of COVID-19 - Plan: CBC with Differential/Platelet, Comprehensive metabolic panel, TSH Some of her memory issues do point towards cognitive dysfunction.  This could also be an issue of COVID long-hauler.  She is interested in Aricept.  If her blood work comes back normal I would certainly consider giving her that just to see if it does any good.

## 2022-05-04 NOTE — Patient Instructions (Signed)

## 2022-05-05 LAB — VITAMIN B12: Vitamin B-12: >2000 pg/mL — ABNORMAL HIGH (ref 232–1245)

## 2022-05-05 LAB — CBC WITH DIFFERENTIAL/PLATELET
Basophils Absolute: 0.1 10*3/uL (ref 0.0–0.2)
Basos: 1 %
EOS (ABSOLUTE): 0.1 10*3/uL (ref 0.0–0.4)
Eos: 1 %
Hematocrit: 43.5 % (ref 34.0–46.6)
Hemoglobin: 14.6 g/dL (ref 11.1–15.9)
Immature Grans (Abs): 0 10*3/uL (ref 0.0–0.1)
Immature Granulocytes: 0 %
Lymphocytes Absolute: 1.4 10*3/uL (ref 0.7–3.1)
Lymphs: 19 %
MCH: 31.8 pg (ref 26.6–33.0)
MCHC: 33.6 g/dL (ref 31.5–35.7)
MCV: 95 fL (ref 79–97)
Monocytes Absolute: 0.5 10*3/uL (ref 0.1–0.9)
Monocytes: 7 %
Neutrophils Absolute: 5.4 10*3/uL (ref 1.4–7.0)
Neutrophils: 72 %
Platelets: 182 10*3/uL (ref 150–450)
RBC: 4.59 x10E6/uL (ref 3.77–5.28)
RDW: 12.3 % (ref 11.7–15.4)
WBC: 7.4 10*3/uL (ref 3.4–10.8)

## 2022-05-05 LAB — COMPREHENSIVE METABOLIC PANEL
ALT: 12 IU/L (ref 0–32)
AST: 21 IU/L (ref 0–40)
Albumin/Globulin Ratio: 1.6 (ref 1.2–2.2)
Albumin: 4.6 g/dL (ref 3.7–4.7)
Alkaline Phosphatase: 58 IU/L (ref 44–121)
BUN/Creatinine Ratio: 14 (ref 12–28)
BUN: 15 mg/dL (ref 8–27)
Bilirubin Total: 1 mg/dL (ref 0.0–1.2)
CO2: 22 mmol/L (ref 20–29)
Calcium: 10.5 mg/dL — ABNORMAL HIGH (ref 8.7–10.3)
Chloride: 104 mmol/L (ref 96–106)
Creatinine, Ser: 1.06 mg/dL — ABNORMAL HIGH (ref 0.57–1.00)
Globulin, Total: 2.9 g/dL (ref 1.5–4.5)
Glucose: 96 mg/dL (ref 70–99)
Potassium: 4.6 mmol/L (ref 3.5–5.2)
Sodium: 144 mmol/L (ref 134–144)
Total Protein: 7.5 g/dL (ref 6.0–8.5)
eGFR: 52 mL/min/{1.73_m2} — ABNORMAL LOW (ref >59)

## 2022-05-05 LAB — TSH: TSH: 0.623 u[IU]/mL (ref 0.450–4.500)

## 2022-05-05 LAB — FOLATE: Folate: 8 ng/mL (ref >3.0)

## 2022-05-06 MED ORDER — DONEPEZIL HCL 5 MG PO TABS: 5.0000 mg | ORAL_TABLET | Freq: Every day | ORAL | 0 refills | Status: DC

## 2022-05-06 NOTE — Addendum Note (Signed)
Addended by: Denita Lung on: 05/06/2022 09:16 AM   Modules accepted: Orders

## 2022-05-24 DIAGNOSIS — H353131 Nonexudative age-related macular degeneration, bilateral, early dry stage: Secondary | ICD-10-CM | POA: Diagnosis not present

## 2022-06-07 ENCOUNTER — Encounter: Payer: Self-pay | Admitting: Family Medicine

## 2022-06-07 ENCOUNTER — Ambulatory Visit (INDEPENDENT_AMBULATORY_CARE_PROVIDER_SITE_OTHER): Payer: PPO | Admitting: Family Medicine

## 2022-06-07 VITALS — BP 128/82 | HR 90 | Temp 98.2°F | Wt 147.8 lb

## 2022-06-07 DIAGNOSIS — I700 Atherosclerosis of aorta: Secondary | ICD-10-CM

## 2022-06-07 DIAGNOSIS — M1990 Unspecified osteoarthritis, unspecified site: Secondary | ICD-10-CM

## 2022-06-07 DIAGNOSIS — R413 Other amnesia: Secondary | ICD-10-CM | POA: Diagnosis not present

## 2022-06-07 DIAGNOSIS — H3530 Unspecified macular degeneration: Secondary | ICD-10-CM | POA: Diagnosis not present

## 2022-06-07 MED ORDER — ROSUVASTATIN CALCIUM 20 MG PO TABS: 20.0000 mg | ORAL_TABLET | Freq: Every day | ORAL | 3 refills | Status: DC

## 2022-06-07 MED ORDER — DONEPEZIL HCL 10 MG PO TABS: 10.0000 mg | ORAL_TABLET | Freq: Every day | ORAL | 3 refills | Status: DC

## 2022-06-07 NOTE — Progress Notes (Signed)
   Subjective:    Patient ID: Allison [name], female    DOB: [birth year], 81 y.o.   MRN: [id number]  HPI She is here for recheck.  She has been on Aricept and is happy with the results.  She has no side effects that she complains of but has noted an increase in her memory.  She also would like to be switched away from simvastatin to rosuvastatin as she has heard that this is less likely to cause CNS symptoms.  She also complains of degeneration and slight discomfort of her left index finger.  She has been seen by her ophthalmologist and now apparently has macular degeneration.   Review of Systems     Objective:   Physical Exam Alert and in no distress.  Slight enlargement of the left index DIP joint       Assessment & Plan:  Memory changes - Plan: donepezil (ARICEPT) 10 MG tablet  Atherosclerosis of aorta (HCC) - Plan: rosuvastatin (CRESTOR) 20 MG tablet  Macular degeneration of both eyes, unspecified type I will increase her Aricept and she is to keep me informed if he has side effects and poor efficacy. Did switch to Crestor and explaining that we will  keep her on this. Explained that the fingers probably arthritis and treated symptomatically.

## 2022-06-08 ENCOUNTER — Ambulatory Visit: Payer: PPO | Admitting: Family Medicine

## 2022-06-14 ENCOUNTER — Telehealth: Payer: Self-pay | Admitting: Family Medicine

## 2022-06-14 ENCOUNTER — Other Ambulatory Visit: Payer: Self-pay

## 2022-06-14 DIAGNOSIS — E039 Hypothyroidism, unspecified: Secondary | ICD-10-CM

## 2022-06-14 MED ORDER — LEVOTHYROXINE SODIUM 75 MCG PO TABS: 75.0000 ug | ORAL_TABLET | Freq: Every day | ORAL | 1 refills | Status: DC

## 2022-06-14 NOTE — Telephone Encounter (Signed)
Fax from [facility] for  levothyroxine  75 mcg

## 2022-06-15 ENCOUNTER — Encounter: Payer: Self-pay | Admitting: Family Medicine

## 2022-06-23 ENCOUNTER — Telehealth: Payer: Self-pay | Admitting: Family Medicine

## 2022-06-23 NOTE — Telephone Encounter (Signed)
Allison [name] left a message that she has an upcoming AWV on 9/1 and she can only do telephone not video.  I sent a note to Glenna Durand to call patient and advise if this was ok.

## 2022-06-25 ENCOUNTER — Ambulatory Visit (INDEPENDENT_AMBULATORY_CARE_PROVIDER_SITE_OTHER): Payer: PPO

## 2022-06-25 VITALS — Ht 65.0 in | Wt 145.0 lb

## 2022-06-25 DIAGNOSIS — Z0000 Encounter for general adult medical examination without abnormal findings: Secondary | ICD-10-CM

## 2022-06-25 NOTE — Progress Notes (Signed)
I connected with Gustavo Lah today by telephone and verified that I am speaking with the correct person using two identifiers. Location patient: home Location provider: work Persons participating in the virtual visit: Jema Deegan, Glenna Durand LPN.   I discussed the limitations, risks, security and privacy concerns of performing an evaluation and management service by telephone and the availability of in person appointments. I also discussed with the patient that there may be a patient responsible charge related to this service. The patient expressed understanding and verbally consented to this telephonic visit.    Interactive audio and video telecommunications were attempted between this provider and patient, however failed, due to patient having technical difficulties OR patient did not have access to video capability.  We continued and completed visit with audio only.     Vital signs may be patient reported or missing.  Subjective:   Allison [name] is a 81 y.o. female who presents for Medicare Annual (Subsequent) preventive examination.  Review of Systems     Cardiac Risk Factors include: advanced age (>48mn, >>37women);hypertension     Objective:    Today's Vitals   06/25/22 1401  Weight: 145 lb (65.8 kg)  Height: '5\' 5"'$  (1.651 m)   Body mass index is 24.13 kg/m.     06/25/2022    2:06 PM 08/19/2021    1:21 PM 06/04/2021    1:29 PM 11/21/2019    4:26 PM 09/07/2019    1:00 PM 09/04/2019   10:24 AM 07/18/2019    9:01 AM  Advanced Directives  Does Patient Have a Medical Advance Directive? Yes No No No No No No  Type of AParamedicof [place]Living will        Copy of [place]? No - copy requested        Would patient like information on creating a medical advance directive?  No - Patient declined Yes (ED - Information included in AVS) No - Patient declined No - Patient declined No - Patient declined No - Patient  declined    Current Medications (verified) Outpatient Encounter Medications as of 06/25/2022  Medication Sig   donepezil (ARICEPT) 10 MG tablet Take 1 tablet (10 mg total) by mouth at bedtime.   levothyroxine (SYNTHROID) 75 MCG tablet Take 1 tablet (75 mcg total) by mouth daily before breakfast.   Multiple Vitamin (MULTIVITAMIN ADULT) TABS Take 1 tablet by mouth daily.   rosuvastatin (CRESTOR) 20 MG tablet Take 1 tablet (20 mg total) by mouth daily.   methylPREDNISolone (MEDROL) 4 MG tablet Medrol dose pack. Take as instructed (Patient not taking: Reported on 06/04/2021)   nabumetone (RELAFEN) 750 MG tablet TAKE 1 TABLET(750 MG) BY MOUTH TWICE DAILY AS NEEDED (Patient not taking: Reported on 06/07/2022)   omeprazole (PRILOSEC) 20 MG capsule Take 1 capsule by mouth daily (Patient not taking: Reported on 06/25/2022)   ondansetron (ZOFRAN ODT) 4 MG disintegrating tablet Take 1 tablet (4 mg total) by mouth every 8 (eight) hours as needed for nausea or vomiting. (Patient not taking: Reported on 05/04/2022)   tiZANidine (ZANAFLEX) 4 MG tablet TAKE 1 TABLET(4 MG) BY MOUTH EVERY 8 HOURS AS NEEDED FOR MUSCLE SPASMS (Patient not taking: Reported on 06/04/2021)   No facility-administered encounter medications on file as of 06/25/2022.    Allergies (verified) Niacin   History: Past Medical History:  Diagnosis Date   Arthritis    lower back   Former smoker    GERD (gastroesophageal reflux  disease)    History of appendicitis    History of blood transfusion 1960   after delivery of son   History of cataract    History of kidney stones 1999   lithrotripsy   Hypercholesterolemia    Hypertension    Hypothyroid    Iron deficiency anemia    Macular degeneration    bilateral   Pinched nerve    back   Seasonal allergies    RHINITIS   Tachycardia    Wears glasses    reading   Past Surgical History:  Procedure Laterality Date   ABDOMINAL HYSTERECTOMY     APPENDECTOMY     cataract surgery Bilateral  03/2014   polyps   COLONOSCOPY  02/2010   CYSTOSCOPY W/ URETERAL STENT PLACEMENT Right 09/07/2019   Procedure: CYSTOSCOPY WITH STENT REPLACEMENT WITH RETROGRADES;  Surgeon: Alexis Frock, MD;  Location: [facility];  Service: Urology;  Laterality: Right;   CYSTOSCOPY/RETROGRADE/URETEROSCOPY Bilateral 07/18/2019   Procedure: CYSTOSCOPY/RETROGRADE/RIGHT URETEROSCOPY/BLADDER BIOPSY/STENT PLACEMENT/RIGHT URETERAL MASS BIOPSY;  Surgeon: Alexis Frock, MD;  Location: [facility];  Service: Urology;  Laterality: Bilateral;  1 HR   Family History  Problem Relation Age of Onset   Arthritis Mother    Diabetes Mother    Arthritis Father    Diabetes Father    Hyperlipidemia Father    Colon cancer Brother 32   Colon polyps Brother 5   Cancer Paternal Grandmother    Heart failure Sister    Hyperlipidemia Sister    Rectal cancer Neg Hx    Stomach cancer Neg Hx    Esophageal cancer Neg Hx    Social History   Socioeconomic History   Marital status: Married    Spouse name: Not on file   Number of children: Not on file   Years of education: Not on file   Highest education level: Not on file  Occupational History   Not on file  Tobacco Use   Smoking status: Former    Packs/day: 1.00    Years: 20.00    Total pack years: 20.00    Types: Cigarettes    Quit date: 10/24/1993    Years since quitting: 28.6   Smokeless tobacco: Never  Vaping Use   Vaping Use: Never used  Substance and Sexual Activity   Alcohol use: Yes    Alcohol/week: 5.0 standard drinks of alcohol    Types: 5 Glasses of wine per week    Comment: 4-5 glasses of wine per week.    Drug use: No   Sexual activity: Not Currently    Birth control/protection: Post-menopausal, Surgical  Other Topics Concern   Not on file  Social History Narrative   Not on file   Social Determinants of Health   Financial Resource Strain: Low Risk  (06/25/2022)   Overall Financial Resource Strain (CARDIA)    Difficulty of Paying  Living Expenses: Not hard at all  Food Insecurity: No Food Insecurity (06/25/2022)   Hunger Vital Sign    Worried About Running Out of Food in the Last Year: Never true    Ran Out of Food in the Last Year: Never true  Transportation Needs: No Transportation Needs (06/25/2022)   PRAPARE - Hydrologist (Medical): No    Lack of Transportation (Non-Medical): No  Physical Activity: Inactive (06/25/2022)   Exercise Vital Sign    Days of Exercise per Week: 0 days    Minutes of Exercise per Session: 0 min  Stress:  No Stress Concern Present (06/25/2022)   [place]    Feeling of Stress : Not at all  Social Connections: Not on file    Tobacco Counseling Counseling given: Not Answered   Clinical Intake:  Pre-visit preparation completed: Yes  Pain : No/denies pain     Nutritional Status: BMI of 19-24  Normal Nutritional Risks: None Diabetes: No  How often do you need to have someone help you when you read instructions, pamphlets, or other written materials from your doctor or pharmacy?: 1 - Never What is the last grade level you completed in school?: college  Diabetic? no  Interpreter Needed?: No  Information entered by :: NAllen LPN   Activities of Daily Living    06/25/2022    2:09 PM  In your present state of health, do you have any difficulty performing the following activities:  Hearing? 0  Vision? 0  Difficulty concentrating or making decisions? 1  Walking or climbing stairs? 0  Dressing or bathing? 0  Doing errands, shopping? 0  Preparing Food and eating ? N  Using the Toilet? N  In the past six months, have you accidently leaked urine? N  Do you have problems with loss of bowel control? N  Managing your Medications? N  Managing your Finances? N  Housekeeping or managing your Housekeeping? N    Patient Care Team: Denita Lung, MD as PCP - General (Family  Medicine)  Indicate any recent Medical Services you may have received from other than [facility] providers in the past year (date may be approximate).     Assessment:   This is a routine wellness examination for [place].  Hearing/Vision screen Vision Screening - Comments:: Regular eye exams, [place]  Dietary issues and exercise activities discussed: Current Exercise Habits: The patient does not participate in regular exercise at present   Goals Addressed             This Visit's Progress    Patient Stated       06/25/2022, no goals       Depression Screen    06/25/2022    2:08 PM 06/04/2021    1:30 PM 05/22/2020   11:06 AM 08/03/2018    1:50 PM 12/09/2016    9:22 AM 12/04/2015    9:57 AM 05/02/2014    8:05 AM  PHQ 2/9 Scores  PHQ - 2 Score 0 0 0 0 0 0 6  PHQ- 9 Score 0          Fall Risk    06/25/2022    2:07 PM 06/04/2021    1:29 PM 09/19/2019    9:42 AM 08/03/2018    1:50 PM 12/09/2016    9:22 AM  Fall Risk   Falls in the past year? 0 1 0 No No  Comment   Emmi Telephone Survey: data to providers prior to load    Number falls in past yr: 0 1     Injury with Fall? 0 1     Risk for fall due to : Medication side effect Impaired balance/gait     Follow up Falls prevention discussed;Falls evaluation completed;Education provided Falls prevention discussed;Falls evaluation completed;Education provided       FALL RISK PREVENTION PERTAINING TO THE HOME:  Any stairs in or around the home? Yes  If so, are there any without handrails? No  Home free of loose throw rugs in walkways, pet beds, electrical cords, etc? Yes  Adequate lighting in your home to reduce risk of falls? Yes   ASSISTIVE DEVICES UTILIZED TO PREVENT FALLS:  Life alert? No  Use of a cane, walker or w/c? No  Grab bars in the bathroom? Yes  Shower chair or bench in shower? Yes  Elevated toilet seat or a handicapped toilet? Yes   TIMED UP AND GO:  Was the test performed? No .      Cognitive  Function:    05/04/2022   10:31 AM  MMSE - Mini Mental State Exam  Orientation to time 4  Orientation to Place 5  Registration 3  Attention/ Calculation 5  Recall 3  Language- name 2 objects 2  Language- repeat 1  Language- follow 3 step command 3  Language- read & follow direction 1  Write a sentence 1  Copy design 1  Total score 29        06/25/2022    2:10 PM  6CIT Screen  What Year? 0 points  What month? 0 points  What time? 0 points  Count back from 20 0 points  Months in reverse 2 points  Repeat phrase 0 points  Total Score 2 points    Immunizations Immunization History  Administered Date(s) Administered   DTaP 05/13/1987   HPV Quadrivalent 04/30/1955, 06/01/1955, 01/14/1956   Influenza Split 08/09/2015   Influenza Whole 08/17/2007   Influenza, High Dose Seasonal PF 07/23/2016, 07/11/2017, 08/03/2018, 06/08/2019   Influenza-Unspecified 08/06/2021   PFIZER(Purple Top)SARS-COV-2 Vaccination 11/13/2019, 01/01/2020   Pfizer Covid-19 Vaccine Bivalent Booster 4yr & up 08/21/2021   Pneumococcal Conjugate-13 12/04/2015   Pneumococcal Polysaccharide-23 08/17/2007, 10/21/2014, 09/08/2019   Tdap 12/04/2015   Zoster Recombinat (Shingrix) 02/24/2018, 08/14/2018, 06/20/2019   Zoster, Live 12/04/2015    TDAP status: Up to date  Flu Vaccine status: Due, Education has been provided regarding the importance of this vaccine. Advised may receive this vaccine at local pharmacy or Health Dept. Aware to provide a copy of the vaccination record if obtained from local pharmacy or Health Dept. Verbalized acceptance and understanding.  Pneumococcal vaccine status: Up to date  Covid-19 vaccine status: Completed vaccines  Qualifies for Shingles Vaccine? Yes   Zostavax completed Yes   Shingrix Completed?: Yes  Screening Tests Health Maintenance  Topic Date Due   COLONOSCOPY (Pts 45-465yrInsurance coverage will need to be confirmed)  07/07/2020   COVID-19 Vaccine (4 - Pfizer  series) 12/22/2021   INFLUENZA VACCINE  05/25/2022   TETANUS/TDAP  12/03/2025   Pneumonia Vaccine 6531Years old  Completed   DEXA SCAN  Completed   HPV VACCINES  Completed   Zoster Vaccines- Shingrix  Completed    Health Maintenance  Health Maintenance Due  Topic Date Due   COLONOSCOPY (Pts [id number] coverage will need to be confirmed)  07/07/2020   COVID-19 Vaccine (4 - Pfizer series) 12/22/2021   INFLUENZA VACCINE  05/25/2022    Colorectal cancer screening: No longer required.   Mammogram status: Completed 10/27/2021. Repeat every year  Bone Density status: Completed 10/27/2021.   Lung Cancer Screening: (Low Dose CT Chest recommended if Age 52-15-80ars, 30 pack-year currently smoking OR have quit w/in 15years.) does not qualify.   Lung Cancer Screening Referral: no  Additional Screening:  Hepatitis C Screening: does not qualify;  Vision Screening: Recommended annual ophthalmology exams for early detection of glaucoma and other disorders of the eye. Is the patient up to date with their annual eye exam?  Yes  Who is the provider or what is  the name of the office in which the patient attends annual eye exams? [place] If pt is not established with a provider, would they like to be referred to a provider to establish care? No .   Dental Screening: Recommended annual dental exams for proper oral hygiene  Community Resource Referral / Chronic Care Management: CRR required this visit?  No   CCM required this visit?  No      Plan:     I have personally reviewed and noted the following in the patient's chart:   Medical and social history Use of alcohol, tobacco or illicit drugs  Current medications and supplements including opioid prescriptions. Patient is not currently taking opioid prescriptions. Functional ability and status Nutritional status Physical activity Advanced directives List of other physicians Hospitalizations, surgeries, and ER visits in  previous 12 months Vitals Screenings to include cognitive, depression, and falls Referrals and appointments  In addition, I have reviewed and discussed with patient certain preventive protocols, quality metrics, and best practice recommendations. A written personalized care plan for preventive services as well as general preventive health recommendations were provided to patient.     Kellie Simmering, LPN   10/30/1094   Nurse Notes: none  Due to this being a virtual visit, the after visit summary with patients personalized plan was offered to patient via mail or my-chart. Patient would like to access on my-chart

## 2022-06-25 NOTE — Patient Instructions (Signed)
Allison [name] , Thank you for taking time to come for your Medicare Wellness Visit. I appreciate your ongoing commitment to your health goals. Please review the following plan we discussed and let me know if I can assist you in the future.   Screening recommendations/referrals: Colonoscopy: not required Mammogram: completed 10/27/2021, due 10/28/2022 Bone Density: completed 10/27/2021 Recommended yearly ophthalmology/optometry visit for glaucoma screening and checkup Recommended yearly dental visit for hygiene and checkup  Vaccinations: Influenza vaccine: due Pneumococcal vaccine: completed 09/08/2019 Tdap vaccine: completed 12/04/2015, due 12/03/2025 Shingles vaccine: completed  Covid-19: 08/21/2021, 01/01/2020, 11/13/2019  Advanced directives: Please bring a copy of your POA (Power of Attorney) and/or Living Will to your next appointment.   Conditions/risks identified: none  Next appointment: Follow up in one year for your annual wellness visit    Preventive Care 65 Years and Older, Female Preventive care refers to lifestyle choices and visits with your health care provider that can promote health and wellness. What does preventive care include? A yearly physical exam. This is also called an annual well check. Dental exams once or twice a year. Routine eye exams. Ask your health care provider how often you should have your eyes checked. Personal lifestyle choices, including: Daily care of your teeth and gums. Regular physical activity. Eating a healthy diet. Avoiding tobacco and drug use. Limiting alcohol use. Practicing safe sex. Taking low-dose aspirin every day. Taking vitamin and mineral supplements as recommended by your health care provider. What happens during an annual well check? The services and screenings done by your health care provider during your annual well check will depend on your age, overall health, lifestyle risk factors, and family history of disease. Counseling   Your health care provider may ask you questions about your: Alcohol use. Tobacco use. Drug use. Emotional well-being. Home and relationship well-being. Sexual activity. Eating habits. History of falls. Memory and ability to understand (cognition). Work and work Statistician. Reproductive health. Screening  You may have the following tests or measurements: Height, weight, and BMI. Blood pressure. Lipid and cholesterol levels. These may be checked every 5 years, or more frequently if you are over 38 years old. Skin check. Lung cancer screening. You may have this screening every year starting at age 59 if you have a 30-pack-year history of smoking and currently smoke or have quit within the past 15 years. Fecal occult blood test (FOBT) of the stool. You may have this test every year starting at age 17. Flexible sigmoidoscopy or colonoscopy. You may have a sigmoidoscopy every 5 years or a colonoscopy every 10 years starting at age 54. Hepatitis C blood test. Hepatitis B blood test. Sexually transmitted disease (STD) testing. Diabetes screening. This is done by checking your blood sugar (glucose) after you have not eaten for a while (fasting). You may have this done every 1-3 years. Bone density scan. This is done to screen for osteoporosis. You may have this done starting at age 65. Mammogram. This may be done every 1-2 years. Talk to your health care provider about how often you should have regular mammograms. Talk with your health care provider about your test results, treatment options, and if necessary, the need for more tests. Vaccines  Your health care provider may recommend certain vaccines, such as: Influenza vaccine. This is recommended every year. Tetanus, diphtheria, and acellular pertussis (Tdap, Td) vaccine. You may need a Td booster every 10 years. Zoster vaccine. You may need this after age 47. Pneumococcal 13-valent conjugate (PCV13) vaccine. One dose  is recommended  after age 49. Pneumococcal polysaccharide (PPSV23) vaccine. One dose is recommended after age 44. Talk to your health care provider about which screenings and vaccines you need and how often you need them. This information is not intended to replace advice given to you by your health care provider. Make sure you discuss any questions you have with your health care provider. Document Released: 11/07/2015 Document Revised: 06/30/2016 Document Reviewed: 08/12/2015 Elsevier Interactive Patient Education  2017 [place] Prevention in the Home Falls can cause injuries. They can happen to people of all ages. There are many things you can do to make your home safe and to help prevent falls. What can I do on the outside of my home? Regularly fix the edges of walkways and driveways and fix any cracks. Remove anything that might make you trip as you walk through a door, such as a raised step or threshold. Trim any bushes or trees on the path to your home. Use bright outdoor lighting. Clear any walking paths of anything that might make someone trip, such as rocks or tools. Regularly check to see if handrails are loose or broken. Make sure that both sides of any steps have handrails. Any raised decks and porches should have guardrails on the edges. Have any leaves, snow, or ice cleared regularly. Use sand or salt on walking paths during winter. Clean up any spills in your garage right away. This includes oil or grease spills. What can I do in the bathroom? Use night lights. Install grab bars by the toilet and in the tub and shower. Do not use towel bars as grab bars. Use non-skid mats or decals in the tub or shower. If you need to sit down in the shower, use a plastic, non-slip stool. Keep the floor dry. Clean up any water that spills on the floor as soon as it happens. Remove soap buildup in the tub or shower regularly. Attach bath mats securely with double-sided non-slip rug tape. Do not  have throw rugs and other things on the floor that can make you trip. What can I do in the bedroom? Use night lights. Make sure that you have a light by your bed that is easy to reach. Do not use any sheets or blankets that are too big for your bed. They should not hang down onto the floor. Have a firm chair that has side arms. You can use this for support while you get dressed. Do not have throw rugs and other things on the floor that can make you trip. What can I do in the kitchen? Clean up any spills right away. Avoid walking on wet floors. Keep items that you use a lot in easy-to-reach places. If you need to reach something above you, use a strong step stool that has a grab bar. Keep electrical cords out of the way. Do not use floor polish or wax that makes floors slippery. If you must use wax, use non-skid floor wax. Do not have throw rugs and other things on the floor that can make you trip. What can I do with my stairs? Do not leave any items on the stairs. Make sure that there are handrails on both sides of the stairs and use them. Fix handrails that are broken or loose. Make sure that handrails are as long as the stairways. Check any carpeting to make sure that it is firmly attached to the stairs. Fix any carpet that is loose or worn. Avoid  having throw rugs at the top or bottom of the stairs. If you do have throw rugs, attach them to the floor with carpet tape. Make sure that you have a light switch at the top of the stairs and the bottom of the stairs. If you do not have them, ask someone to add them for you. What else can I do to help prevent falls? Wear shoes that: Do not have high heels. Have rubber bottoms. Are comfortable and fit you well. Are closed at the toe. Do not wear sandals. If you use a stepladder: Make sure that it is fully opened. Do not climb a closed stepladder. Make sure that both sides of the stepladder are locked into place. Ask someone to hold it for  you, if possible. Clearly mark and make sure that you can see: Any grab bars or handrails. First and last steps. Where the edge of each step is. Use tools that help you move around (mobility aids) if they are needed. These include: Canes. Walkers. Scooters. Crutches. Turn on the lights when you go into a dark area. Replace any light bulbs as soon as they burn out. Set up your furniture so you have a clear path. Avoid moving your furniture around. If any of your floors are uneven, fix them. If there are any pets around you, be aware of where they are. Review your medicines with your doctor. Some medicines can make you feel dizzy. This can increase your chance of falling. Ask your doctor what other things that you can do to help prevent falls. This information is not intended to replace advice given to you by your health care provider. Make sure you discuss any questions you have with your health care provider. Document Released: 08/07/2009 Document Revised: 03/18/2016 Document Reviewed: 11/15/2014 Elsevier Interactive Patient Education  2017 [employer].

## 2022-06-30 ENCOUNTER — Encounter: Payer: Self-pay | Admitting: Internal Medicine

## 2022-08-03 ENCOUNTER — Encounter: Payer: Self-pay | Admitting: Internal Medicine

## 2022-08-16 ENCOUNTER — Encounter: Payer: Self-pay | Admitting: Internal Medicine

## 2022-09-23 ENCOUNTER — Encounter: Payer: Self-pay | Admitting: Internal Medicine

## 2022-10-01 ENCOUNTER — Ambulatory Visit: Admission: EM | Admit: 2022-10-01 | Discharge: 2022-10-01 | Discharge status: Against Medical Advice | Payer: PPO

## 2022-10-01 NOTE — ED Triage Notes (Signed)
Pt c/o cough, chest congestion, fatigue   Onset ~ yesterday   Denies pain in triage

## 2022-10-04 ENCOUNTER — Encounter: Payer: Self-pay | Admitting: *Deleted

## 2022-10-07 ENCOUNTER — Telehealth (INDEPENDENT_AMBULATORY_CARE_PROVIDER_SITE_OTHER): Payer: PPO | Admitting: Family Medicine

## 2022-10-07 ENCOUNTER — Encounter: Payer: Self-pay | Admitting: Family Medicine

## 2022-10-07 DIAGNOSIS — J40 Bronchitis, not specified as acute or chronic: Secondary | ICD-10-CM | POA: Diagnosis not present

## 2022-10-07 MED ORDER — HYDROCOD POLI-CHLORPHE POLI ER 10-8 MG/5ML PO SUER: 5.0000 mL | Freq: Two times a day (BID) | ORAL | 0 refills | Status: DC | PRN

## 2022-10-07 MED ORDER — AZITHROMYCIN 500 MG PO TABS: 500.0000 mg | ORAL_TABLET | Freq: Every day | ORAL | 0 refills | Status: DC

## 2022-10-07 NOTE — Progress Notes (Signed)
   Subjective:    Patient ID: Lenward Chancellor, female    DOB: 01-07-1941, 81 y.o.   MRN: [id number]  HPI Documentation for virtual audio  telecommunications The patient was located at home. 2 patient identifiers used.  She does not have a cell phone or computer. The provider was located in the office. The patient did consent to this visit and is aware of possible charges through their insurance for this visit. The other persons participating in this telemedicine service were none. Time spent on call was 5 minutes and in review of previous records >0 minutes total for counseling and coordination of care. This virtual service is not related to other E/M service within previous 7 days.  She states that last Friday she developed a cough of whitish sputum with malaise, nasal congestion but no fever, chills, sore throat or earache.  She has been using Robitussin DM for this.  She states that she is not any better today.  Review of Systems     Objective:   Physical Exam Alert and does not sound as if she is in any distress.  Not examined.       Assessment & Plan:  Bronchitis - Plan: azithromycin (ZITHROMAX) 500 MG tablet, chlorpheniramine-HYDROcodone (TUSSIONEX) 10-8 MG/5ML I explained that the antibiotic will work much longer than the 3 days.  She has used Tussionex in the past with good results and would like a refill.

## 2022-10-19 ENCOUNTER — Telehealth: Payer: Self-pay | Admitting: *Deleted

## 2022-10-19 ENCOUNTER — Ambulatory Visit: Payer: PPO | Admitting: Internal Medicine

## 2022-10-19 ENCOUNTER — Other Ambulatory Visit: Payer: PPO

## 2022-10-19 ENCOUNTER — Other Ambulatory Visit (INDEPENDENT_AMBULATORY_CARE_PROVIDER_SITE_OTHER): Payer: PPO

## 2022-10-19 ENCOUNTER — Encounter: Payer: Self-pay | Admitting: Internal Medicine

## 2022-10-19 VITALS — BP 120/80 | HR 73 | Ht 66.0 in | Wt 141.0 lb

## 2022-10-19 DIAGNOSIS — R197 Diarrhea, unspecified: Secondary | ICD-10-CM

## 2022-10-19 DIAGNOSIS — K219 Gastro-esophageal reflux disease without esophagitis: Secondary | ICD-10-CM | POA: Diagnosis not present

## 2022-10-19 DIAGNOSIS — R152 Fecal urgency: Secondary | ICD-10-CM

## 2022-10-19 DIAGNOSIS — E875 Hyperkalemia: Secondary | ICD-10-CM

## 2022-10-19 LAB — CBC WITH DIFFERENTIAL/PLATELET
Basophils Absolute: 0.1 10*3/uL (ref 0.0–0.1)
Basophils Relative: 0.7 % (ref 0.0–3.0)
Eosinophils Absolute: 0.1 10*3/uL (ref 0.0–0.7)
Eosinophils Relative: 1.6 % (ref 0.0–5.0)
HCT: 42.7 % (ref 36.0–46.0)
Hemoglobin: 14.4 g/dL (ref 12.0–15.0)
Lymphocytes Relative: 20.6 % (ref 12.0–46.0)
Lymphs Abs: 1.7 10*3/uL (ref 0.7–4.0)
MCHC: 33.8 g/dL (ref 30.0–36.0)
MCV: 96.5 fl (ref 78.0–100.0)
Monocytes Absolute: 0.6 10*3/uL (ref 0.1–1.0)
Monocytes Relative: 6.8 % (ref 3.0–12.0)
Neutro Abs: 5.8 10*3/uL (ref 1.4–7.7)
Neutrophils Relative %: 70.3 % (ref 43.0–77.0)
Platelets: 173 10*3/uL (ref 150.0–400.0)
RBC: 4.42 Mil/uL (ref 3.87–5.11)
RDW: 13.3 % (ref 11.5–15.5)
WBC: 8.2 10*3/uL (ref 4.0–10.5)

## 2022-10-19 LAB — COMPREHENSIVE METABOLIC PANEL
ALT: 22 U/L (ref 0–35)
AST: 37 U/L (ref 0–37)
Albumin: 4.4 g/dL (ref 3.5–5.2)
Alkaline Phosphatase: 43 U/L (ref 39–117)
BUN: 21 mg/dL (ref 6–23)
CO2: 29 mEq/L (ref 19–32)
Calcium: 10.7 mg/dL — ABNORMAL HIGH (ref 8.4–10.5)
Chloride: 105 mEq/L (ref 96–112)
Creatinine, Ser: 1.23 mg/dL — ABNORMAL HIGH (ref 0.40–1.20)
GFR: 40.81 mL/min — ABNORMAL LOW (ref >60.00)
Glucose, Bld: 116 mg/dL — ABNORMAL HIGH (ref 70–99)
Potassium: 6.2 mEq/L (ref 3.5–5.1)
Sodium: 143 mEq/L (ref 135–145)
Total Bilirubin: 0.8 mg/dL (ref 0.2–1.2)
Total Protein: 7.5 g/dL (ref 6.0–8.3)

## 2022-10-19 LAB — BASIC METABOLIC PANEL
BUN: 20 mg/dL (ref 6–23)
CO2: 29 mEq/L (ref 19–32)
Calcium: 10 mg/dL (ref 8.4–10.5)
Chloride: 104 mEq/L (ref 96–112)
Creatinine, Ser: 1.18 mg/dL (ref 0.40–1.20)
GFR: 42.9 mL/min — ABNORMAL LOW (ref >60.00)
Glucose, Bld: 158 mg/dL — ABNORMAL HIGH (ref 70–99)
Potassium: 4.2 mEq/L (ref 3.5–5.1)
Sodium: 142 mEq/L (ref 135–145)

## 2022-10-19 NOTE — Telephone Encounter (Signed)
Received critical lab report from [facility] at 11:43 am indicating that patient's potassium level was 6.2. Dr Hilarie Fredrickson immediately notified. He requests that the patient have a BMP drawn STAT. I have placed orders in the system and have spoken to patient to advise of the elevated reading as well as the need to come back now for repeat testing. She verbalizes understanding of this information and states that she will head back for testing.

## 2022-10-19 NOTE — Progress Notes (Signed)
Patient ID: Allison [name], female   DOB: [birth year], 81 y.o.   MRN: [id number] HPI: Allison [name] is an 81 year old female with a history of GERD, remote colon polyps, family history of colon cancer in her brother, hypertension, hypothyroidism, post COVID memory impairments, history of kidney stones who is seen to evaluate diarrhea and fecal urgency.  She is here alone today.  She is known to me from her surveillance colonoscopy performed on 07/08/2015.  This revealed a small cecal angioectasia and was otherwise normal.  She reports that 2+ months ago she developed a significant change in her bowel pattern.  She reports stools are most often watery and explosive.  She has nearly constant fecal urgency and has had several accidents at home.  She has noticed increased gas as well.  She has not seen blood in her stool.  Maybe 1 day out of 10 she will have soft or even slightly harder stool.  No abdominal pain associated with the symptoms.  Some of her stools have been nocturnal.  For the last week or so she has tried loperamide which helps some but does not stop it.  Frequency of stool seems to also depend on p.o. intake.  She has not had nausea or vomiting.  She does continue omeprazole which is long-term for her for GERD and dyspeptic symptoms.  Aricept was started post COVID and she is lost about 40 pounds since her COVID infection and treatment in 2021.  She lives at home with her husband.  Past Medical History:  Diagnosis Date   Arthritis    lower back   Former smoker    GERD (gastroesophageal reflux disease)    History of appendicitis    History of blood transfusion 1960   after delivery of son   History of cataract    History of kidney stones 1999   lithrotripsy   Hypercholesterolemia    Hyperplastic colon polyp    Hypertension    Hypothyroid    Iron deficiency anemia    Macular degeneration    bilateral   Pinched nerve    back   Seasonal allergies    RHINITIS   Tachycardia     Wears glasses    reading    Past Surgical History:  Procedure Laterality Date   ABDOMINAL HYSTERECTOMY     APPENDECTOMY     cataract surgery Bilateral 03/2014   polyps   COLONOSCOPY  02/2010   CYSTOSCOPY W/ URETERAL STENT PLACEMENT Right 09/07/2019   Procedure: CYSTOSCOPY WITH STENT REPLACEMENT WITH RETROGRADES;  Surgeon: Alexis Frock, MD;  Location: [facility];  Service: Urology;  Laterality: Right;   CYSTOSCOPY/RETROGRADE/URETEROSCOPY Bilateral 07/18/2019   Procedure: CYSTOSCOPY/RETROGRADE/RIGHT URETEROSCOPY/BLADDER BIOPSY/STENT PLACEMENT/RIGHT URETERAL MASS BIOPSY;  Surgeon: Alexis Frock, MD;  Location: [facility];  Service: Urology;  Laterality: Bilateral;  1 HR    Outpatient Medications Prior to Visit  Medication Sig Dispense Refill   azithromycin (ZITHROMAX) 500 MG tablet Take 1 tablet (500 mg total) by mouth daily. 3 tablet 0   chlorpheniramine-HYDROcodone (TUSSIONEX) 10-8 MG/5ML Take 5 mLs by mouth every 12 (twelve) hours as needed for cough. 70 mL 0   donepezil (ARICEPT) 10 MG tablet Take 1 tablet (10 mg total) by mouth at bedtime. 90 tablet 3   levothyroxine (SYNTHROID) 75 MCG tablet Take 1 tablet (75 mcg total) by mouth daily before breakfast. 90 tablet 1   Multiple Vitamin (MULTIVITAMIN ADULT) TABS Take 1 tablet by mouth daily.     nabumetone (RELAFEN) 750  MG tablet TAKE 1 TABLET(750 MG) BY MOUTH TWICE DAILY AS NEEDED (Patient not taking: Reported on 10/07/2022) 60 tablet 1   omeprazole (PRILOSEC) 20 MG capsule Take 1 capsule by mouth daily (Patient not taking: Reported on 06/25/2022) 90 capsule 2   ondansetron (ZOFRAN ODT) 4 MG disintegrating tablet Take 1 tablet (4 mg total) by mouth every 8 (eight) hours as needed for nausea or vomiting. (Patient not taking: Reported on 05/04/2022) 10 tablet 1   rosuvastatin (CRESTOR) 20 MG tablet Take 1 tablet (20 mg total) by mouth daily. 90 tablet 3   tiZANidine (ZANAFLEX) 4 MG tablet TAKE 1 TABLET(4 MG) BY MOUTH EVERY 8  HOURS AS NEEDED FOR MUSCLE SPASMS (Patient not taking: Reported on 06/04/2021) 30 tablet 0   No facility-administered medications prior to visit.    Allergies  Allergen Reactions   Niacin Rash    Family History  Problem Relation Age of Onset   Arthritis Mother    Diabetes Mother    Arthritis Father    Diabetes Father    Hyperlipidemia Father    Colon cancer Brother 3   Colon polyps Brother 77   Cancer Paternal Grandmother    Heart failure Sister    Hyperlipidemia Sister    Rectal cancer Neg Hx    Stomach cancer Neg Hx    Esophageal cancer Neg Hx     Social History   Tobacco Use   Smoking status: Former    Packs/day: 1.00    Years: 20.00    Total pack years: 20.00    Types: Cigarettes    Quit date: 10/24/1993    Years since quitting: 29.0   Smokeless tobacco: Never  Vaping Use   Vaping Use: Never used  Substance Use Topics   Alcohol use: Yes    Alcohol/week: 5.0 standard drinks of alcohol    Types: 5 Glasses of wine per week    Comment: 4-5 glasses of wine per week.    Drug use: No    ROS: As per history of present illness, otherwise negative  BP 120/80   Pulse 73   Ht '5\' 6"'$  (1.676 m) Comment: with shoe on  Wt 141 lb (64 kg)   SpO2 98%   BMI 22.76 kg/m  Gen: awake, alert, NAD HEENT: anicteric  CV: RRR, no mrg Pulm: CTA b/l Abd: soft, NT/ND, +BS throughout Ext: no c/c/e Neuro: nonfocal   RELEVANT LABS AND IMAGING: CBC    Component Value Date/Time   WBC 7.4 05/04/2022 1124   WBC 6.6 11/23/2019 0319   RBC 4.59 05/04/2022 1124   RBC 3.17 (L) 11/23/2019 0319   HGB 14.6 05/04/2022 1124   HCT 43.5 05/04/2022 1124   PLT 182 05/04/2022 1124   MCV 95 05/04/2022 1124   MCH 31.8 05/04/2022 1124   MCH 30.3 11/23/2019 0319   MCHC 33.6 05/04/2022 1124   MCHC 31.4 11/23/2019 0319   RDW 12.3 05/04/2022 1124   LYMPHSABS 1.4 05/04/2022 1124   MONOABS 0.8 11/21/2019 1147   EOSABS 0.1 05/04/2022 1124   BASOSABS 0.1 05/04/2022 1124    CMP      Component Value Date/Time   NA 144 05/04/2022 1124   K 4.6 05/04/2022 1124   CL 104 05/04/2022 1124   CO2 22 05/04/2022 1124   GLUCOSE 96 05/04/2022 1124   GLUCOSE 93 11/23/2019 0319   BUN 15 05/04/2022 1124   CREATININE 1.06 (H) 05/04/2022 1124   CREATININE 1.09 (H) 12/09/2016 1002   CALCIUM 10.5 (H)  05/04/2022 1124   PROT 7.5 05/04/2022 1124   ALBUMIN 4.6 05/04/2022 1124   AST 21 05/04/2022 1124   ALT 12 05/04/2022 1124   ALKPHOS 58 05/04/2022 1124   BILITOT 1.0 05/04/2022 1124   GFRNONAA 46 (L) 05/22/2020 1215   GFRAA 52 (L) 05/22/2020 1215    ASSESSMENT/PLAN: 81 year old female with a history of GERD, remote colon polyps, family history of colon cancer in her brother, hypertension, hypothyroidism, post COVID memory impairments, history of kidney stones who is seen to evaluate diarrhea and fecal urgency.    Subacute, nearly chronic at this point diarrhea and fecal urgency --significant change in bowel pattern for her with urgency.  Workup as follows: -- CBC with differential, CMP, TTG -- GI pathogen panel, fecal calprotectin and fecal elastase -- If unremarkable my recommendation would be for colonoscopy; she is aware that colonoscopy may be eventually recommended if we cannot determine etiology with the above testing.  She would be okay with colonoscopy if this is needed.  2.  GERD and dyspepsia --long-term low-dose omeprazole 20 mg daily.  This can be associated with diarrhea but this medication is not new for her and her symptoms are more subacute.     XB:[id number], Elyse Jarvis, [address] [place],  [place] [address]

## 2022-10-19 NOTE — Addendum Note (Signed)
Addended by: Schuyler Amor on: 10/19/2022 09:53 AM   Modules accepted: Orders

## 2022-10-19 NOTE — Patient Instructions (Addendum)
Your provider has requested that you go to the basement level for lab work before leaving today. Press "B" on the elevator. The lab is located at the first door on the left as you exit the elevator.  Due to recent changes in healthcare laws, you may see the results of your imaging and laboratory studies on MyChart before your provider has had a chance to review them.  We understand that in some cases there may be results that are confusing or concerning to you. Not all laboratory results come back in the same time frame and the provider may be waiting for multiple results in order to interpret others.  Please give [place] 48 hours in order for your provider to thoroughly review all the results before contacting the office for clarification of your results.   If you are age 35 or older, your body mass index should be between 23-30. Your Body mass index is 22.76 kg/m. If this is out of the aforementioned range listed, please consider follow up with your Primary Care Provider.  If you are age 35 or younger, your body mass index should be between 19-25. Your Body mass index is 22.76 kg/m. If this is out of the aformentioned range listed, please consider follow up with your Primary Care Provider.   ________________________________________________________  The [facility] providers would like to encourage you to use [facility] to communicate with providers for non-urgent requests or questions.  Due to long hold times on the telephone, sending your provider a message by [facility] may be a faster and more efficient way to get a response.  Please allow 48 business hours for a response.  Please remember that this is for non-urgent requests.   Thank you for entrusting me with your care and choosing [facility].  Dr Hilarie Fredrickson

## 2022-10-19 NOTE — Addendum Note (Signed)
Addended by: Larina Bras on: 10/19/2022 12:09 PM   Modules accepted: Orders

## 2022-10-20 LAB — IGA: Immunoglobulin A: 200 mg/dL (ref 70–320)

## 2022-10-20 LAB — TISSUE TRANSGLUTAMINASE ABS,IGG,IGA
(tTG) Ab, IgA: <1 U/mL
(tTG) Ab, IgG: <1 U/mL

## 2022-10-21 ENCOUNTER — Other Ambulatory Visit: Payer: PPO

## 2022-10-21 DIAGNOSIS — R197 Diarrhea, unspecified: Secondary | ICD-10-CM

## 2022-10-22 ENCOUNTER — Telehealth: Payer: Self-pay | Admitting: Internal Medicine

## 2022-10-22 DIAGNOSIS — E039 Hypothyroidism, unspecified: Secondary | ICD-10-CM

## 2022-10-22 MED ORDER — LEVOTHYROXINE SODIUM 75 MCG PO TABS: 75.0000 ug | ORAL_TABLET | Freq: Every day | ORAL | 0 refills | Status: DC

## 2022-10-22 NOTE — Telephone Encounter (Signed)
Refill request for levothryoine

## 2022-10-29 LAB — GI PROFILE, STOOL, PCR

## 2022-10-29 LAB — CALPROTECTIN, FECAL: Calprotectin, Fecal: 14 ug/g (ref 0–120)

## 2022-10-30 LAB — PANCREATIC ELASTASE, FECAL: Pancreatic Elastase-1, Stool: >500 mcg/g

## 2022-11-30 DIAGNOSIS — Z1231 Encounter for screening mammogram for malignant neoplasm of breast: Secondary | ICD-10-CM | POA: Diagnosis not present

## 2022-11-30 LAB — HM MAMMOGRAPHY

## 2023-01-04 ENCOUNTER — Telehealth: Payer: Self-pay | Admitting: Family Medicine

## 2023-01-04 DIAGNOSIS — E039 Hypothyroidism, unspecified: Secondary | ICD-10-CM

## 2023-01-04 MED ORDER — LEVOTHYROXINE SODIUM 75 MCG PO TABS: 75.0000 ug | ORAL_TABLET | Freq: Every day | ORAL | 1 refills | Status: DC

## 2023-01-04 NOTE — Telephone Encounter (Signed)
Pharmacy sent refill request for synthroid please send to the [facility] by [address], [place]

## 2023-03-03 ENCOUNTER — Telehealth: Payer: Self-pay | Admitting: Family Medicine

## 2023-03-03 NOTE — Telephone Encounter (Signed)
[facility] req rosuvastatin cal 20 mg refill 90 day

## 2023-03-04 ENCOUNTER — Other Ambulatory Visit: Payer: Self-pay

## 2023-03-04 DIAGNOSIS — I700 Atherosclerosis of aorta: Secondary | ICD-10-CM

## 2023-03-04 MED ORDER — ROSUVASTATIN CALCIUM 20 MG PO TABS: 20.0000 mg | ORAL_TABLET | Freq: Every day | ORAL | 0 refills | Status: DC

## 2023-03-04 NOTE — Telephone Encounter (Signed)
Pt scheduled for med check appt on 04/06/23.

## 2023-03-04 NOTE — Telephone Encounter (Signed)
Done

## 2023-04-06 ENCOUNTER — Encounter: Payer: Self-pay | Admitting: Family Medicine

## 2023-04-06 ENCOUNTER — Ambulatory Visit (INDEPENDENT_AMBULATORY_CARE_PROVIDER_SITE_OTHER): Payer: PPO | Admitting: Family Medicine

## 2023-04-06 VITALS — BP 138/72 | HR 99 | Temp 97.8°F | Resp 16 | Wt 138.0 lb

## 2023-04-06 DIAGNOSIS — R413 Other amnesia: Secondary | ICD-10-CM | POA: Diagnosis not present

## 2023-04-06 DIAGNOSIS — E039 Hypothyroidism, unspecified: Secondary | ICD-10-CM

## 2023-04-06 DIAGNOSIS — D692 Other nonthrombocytopenic purpura: Secondary | ICD-10-CM

## 2023-04-06 DIAGNOSIS — I700 Atherosclerosis of aorta: Secondary | ICD-10-CM | POA: Diagnosis not present

## 2023-04-06 DIAGNOSIS — K219 Gastro-esophageal reflux disease without esophagitis: Secondary | ICD-10-CM | POA: Diagnosis not present

## 2023-04-06 DIAGNOSIS — I10 Essential (primary) hypertension: Secondary | ICD-10-CM | POA: Diagnosis not present

## 2023-04-06 MED ORDER — DONEPEZIL HCL 10 MG PO TABS: 10.0000 mg | ORAL_TABLET | Freq: Every day | ORAL | 3 refills | Status: DC

## 2023-04-06 MED ORDER — ROSUVASTATIN CALCIUM 20 MG PO TABS: 20.0000 mg | ORAL_TABLET | Freq: Every day | ORAL | 3 refills | Status: DC

## 2023-04-06 MED ORDER — LEVOTHYROXINE SODIUM 75 MCG PO TABS: 75.0000 ug | ORAL_TABLET | Freq: Every day | ORAL | 3 refills | Status: DC

## 2023-04-06 NOTE — Progress Notes (Signed)
   Subjective:    Patient ID: Allison [name], female    DOB: [birth year], 82 y.o.   MRN: [id number]  HPI She is here for medication management visit.  She is taking Aricept and thinks it is does help with her memory and was wondering about getting more of this.  She does complain of some leg cramps in the last several months.  She continues on Crestor as well as levothyroxine.  Having no difficulty with that.  Does take omeprazole for reflux on an as-needed basis.  Does complain of bruising to both forearms.   Review of Systems     Objective:   Physical Exam Alert and in no distress. Tympanic membranes and canals are normal. Pharyngeal area is normal. Neck is supple without adenopathy or thyromegaly. Cardiac exam shows a regular sinus rhythm without murmurs or gallops. Lungs are clear to auscultation.  Purpuric changes noted on both forearms.        Assessment & Plan:  Atherosclerosis of aorta (HCC) - Plan: Lipid panel, rosuvastatin (CRESTOR) 20 MG tablet  Essential hypertension - Plan: CBC with Differential/Platelet, Comprehensive metabolic panel  Senile purpura (HCC)  Gastroesophageal reflux disease without esophagitis  Hypothyroidism, unspecified type - Plan: TSH, levothyroxine (SYNTHROID) 75 MCG tablet  Memory changes - Plan: donepezil (ARICEPT) 10 MG tablet I will keep her on her present medication regimen.  Discussed the senile purpura with her and recommend that there is really no therapy for that.  Continue on the PPI on an as-needed basis.  Discussed adding another medication for memory but at this point she would like to hold off on that.  Did recommend Tylenol for her leg cramps and we can readdress this at a later date.  In the past she did use Zanaflex so that is a possibility.

## 2023-04-07 LAB — CBC WITH DIFFERENTIAL/PLATELET
Basophils Absolute: 0 10*3/uL (ref 0.0–0.2)
Basos: 1 %
EOS (ABSOLUTE): 0.1 10*3/uL (ref 0.0–0.4)
Eos: 1 %
Hematocrit: 40.1 % (ref 34.0–46.6)
Hemoglobin: 13.3 g/dL (ref 11.1–15.9)
Immature Grans (Abs): 0 10*3/uL (ref 0.0–0.1)
Immature Granulocytes: 0 %
Lymphocytes Absolute: 1.1 10*3/uL (ref 0.7–3.1)
Lymphs: 16 %
MCH: 31.9 pg (ref 26.6–33.0)
MCHC: 33.2 g/dL (ref 31.5–35.7)
MCV: 96 fL (ref 79–97)
Monocytes Absolute: 0.4 10*3/uL (ref 0.1–0.9)
Monocytes: 7 %
Neutrophils Absolute: 5 10*3/uL (ref 1.4–7.0)
Neutrophils: 75 %
Platelets: 162 10*3/uL (ref 150–450)
RBC: 4.17 x10E6/uL (ref 3.77–5.28)
RDW: 11.9 % (ref 11.7–15.4)
WBC: 6.6 10*3/uL (ref 3.4–10.8)

## 2023-04-07 LAB — COMPREHENSIVE METABOLIC PANEL
ALT: 17 IU/L (ref 0–32)
AST: 30 IU/L (ref 0–40)
Albumin/Globulin Ratio: 1.8
Albumin: 4.4 g/dL (ref 3.7–4.7)
Alkaline Phosphatase: 52 IU/L (ref 44–121)
BUN/Creatinine Ratio: 22 (ref 12–28)
BUN: 19 mg/dL (ref 8–27)
Bilirubin Total: 0.7 mg/dL (ref 0.0–1.2)
CO2: 27 mmol/L (ref 20–29)
Calcium: 9.6 mg/dL (ref 8.7–10.3)
Chloride: 105 mmol/L (ref 96–106)
Creatinine, Ser: 0.87 mg/dL (ref 0.57–1.00)
Globulin, Total: 2.5 g/dL (ref 1.5–4.5)
Glucose: 116 mg/dL — ABNORMAL HIGH (ref 70–99)
Potassium: 4.5 mmol/L (ref 3.5–5.2)
Sodium: 144 mmol/L (ref 134–144)
Total Protein: 6.9 g/dL (ref 6.0–8.5)
eGFR: 66 mL/min/{1.73_m2} (ref >59)

## 2023-04-07 LAB — LIPID PANEL
Chol/HDL Ratio: 2.4 ratio (ref 0.0–4.4)
Cholesterol, Total: 155 mg/dL (ref 100–199)
HDL: 65 mg/dL (ref >39)
LDL Chol Calc (NIH): 66 mg/dL (ref 0–99)
Triglycerides: 142 mg/dL (ref 0–149)
VLDL Cholesterol Cal: 24 mg/dL (ref 5–40)

## 2023-04-07 LAB — TSH: TSH: 10.7 u[IU]/mL — ABNORMAL HIGH (ref 0.450–4.500)

## 2023-04-07 MED ORDER — LEVOTHYROXINE SODIUM 88 MCG PO TABS: 88.0000 ug | ORAL_TABLET | Freq: Every day | ORAL | 3 refills | Status: DC

## 2023-04-07 NOTE — Addendum Note (Signed)
Addended by: Ronnald Nian on: 04/07/2023 05:35 PM   Modules accepted: Orders

## 2023-04-12 ENCOUNTER — Other Ambulatory Visit: Payer: Self-pay

## 2023-04-12 DIAGNOSIS — E039 Hypothyroidism, unspecified: Secondary | ICD-10-CM

## 2023-04-13 ENCOUNTER — Telehealth: Payer: Self-pay | Admitting: Family Medicine

## 2023-04-13 NOTE — Telephone Encounter (Signed)
[facility] order called to confirm dosage for Levothyroxine & I advised 88 mcg & discontinue 75 mcg.

## 2023-06-07 ENCOUNTER — Ambulatory Visit (INDEPENDENT_AMBULATORY_CARE_PROVIDER_SITE_OTHER): Payer: PPO

## 2023-06-07 DIAGNOSIS — Z0000 Encounter for general adult medical examination without abnormal findings: Secondary | ICD-10-CM

## 2023-06-07 NOTE — Patient Instructions (Signed)
Allison [name] , Thank you for taking time to come for your Medicare Wellness Visit. I appreciate your ongoing commitment to your health goals. Please review the following plan we discussed and let me know if I can assist you in the future.   Referrals/Orders/Follow-Ups/Clinician Recommendations: none  This is a list of the screening recommended for you and due dates:  Health Maintenance  Topic Date Due   Colon Cancer Screening  07/07/2020   COVID-19 Vaccine (4 - 2023-24 season) 06/25/2022   Flu Shot  05/26/2023   Medicare Annual Wellness Visit  06/06/2024   DTaP/Tdap/Td vaccine (3 - Td or Tdap) 12/03/2025   Pneumonia Vaccine  Completed   DEXA scan (bone density measurement)  Completed   HPV Vaccine  Completed   Zoster (Shingles) Vaccine  Completed    Advanced directives: (Copy Requested) Please bring a copy of your health care power of attorney and living will to the office to be added to your chart at your convenience.  Next Medicare Annual Wellness Visit scheduled for next year: Yes  Preventive Care 36 Years and Older, Female Preventive care refers to lifestyle choices and visits with your health care provider that can promote health and wellness. What does preventive care include? A yearly physical exam. This is also called an annual well check. Dental exams once or twice a year. Routine eye exams. Ask your health care provider how often you should have your eyes checked. Personal lifestyle choices, including: Daily care of your teeth and gums. Regular physical activity. Eating a healthy diet. Avoiding tobacco and drug use. Limiting alcohol use. Practicing safe sex. Taking low-dose aspirin every day. Taking vitamin and mineral supplements as recommended by your health care provider. What happens during an annual well check? The services and screenings done by your health care provider during your annual well check will depend on your age, overall health, lifestyle risk  factors, and family history of disease. Counseling  Your health care provider may ask you questions about your: Alcohol use. Tobacco use. Drug use. Emotional well-being. Home and relationship well-being. Sexual activity. Eating habits. History of falls. Memory and ability to understand (cognition). Work and work Astronomer. Reproductive health. Screening  You may have the following tests or measurements: Height, weight, and BMI. Blood pressure. Lipid and cholesterol levels. These may be checked every 5 years, or more frequently if you are over 45 years old. Skin check. Lung cancer screening. You may have this screening every year starting at age 5 if you have a 30-pack-year history of smoking and currently smoke or have quit within the past 15 years. Fecal occult blood test (FOBT) of the stool. You may have this test every year starting at age 34. Flexible sigmoidoscopy or colonoscopy. You may have a sigmoidoscopy every 5 years or a colonoscopy every 10 years starting at age 6. Hepatitis C blood test. Hepatitis B blood test. Sexually transmitted disease (STD) testing. Diabetes screening. This is done by checking your blood sugar (glucose) after you have not eaten for a while (fasting). You may have this done every 1-3 years. Bone density scan. This is done to screen for osteoporosis. You may have this done starting at age 68. Mammogram. This may be done every 1-2 years. Talk to your health care provider about how often you should have regular mammograms. Talk with your health care provider about your test results, treatment options, and if necessary, the need for more tests. Vaccines  Your health care provider may recommend certain vaccines,  such as: Influenza vaccine. This is recommended every year. Tetanus, diphtheria, and acellular pertussis (Tdap, Td) vaccine. You may need a Td booster every 10 years. Zoster vaccine. You may need this after age 36. Pneumococcal 13-valent  conjugate (PCV13) vaccine. One dose is recommended after age 25. Pneumococcal polysaccharide (PPSV23) vaccine. One dose is recommended after age 38. Talk to your health care provider about which screenings and vaccines you need and how often you need them. This information is not intended to replace advice given to you by your health care provider. Make sure you discuss any questions you have with your health care provider. Document Released: 11/07/2015 Document Revised: 06/30/2016 Document Reviewed: 08/12/2015 Elsevier Interactive Patient Education  2017 [employer].  Fall Prevention in the Home Falls can cause injuries. They can happen to people of all ages. There are many things you can do to make your home safe and to help prevent falls. What can I do on the outside of my home? Regularly fix the edges of walkways and driveways and fix any cracks. Remove anything that might make you trip as you walk through a door, such as a raised step or threshold. Trim any bushes or trees on the path to your home. Use bright outdoor lighting. Clear any walking paths of anything that might make someone trip, such as rocks or tools. Regularly check to see if handrails are loose or broken. Make sure that both sides of any steps have handrails. Any raised decks and porches should have guardrails on the edges. Have any leaves, snow, or ice cleared regularly. Use sand or salt on walking paths during winter. Clean up any spills in your garage right away. This includes oil or grease spills. What can I do in the bathroom? Use night lights. Install grab bars by the toilet and in the tub and shower. Do not use towel bars as grab bars. Use non-skid mats or decals in the tub or shower. If you need to sit down in the shower, use a plastic, non-slip stool. Keep the floor dry. Clean up any water that spills on the floor as soon as it happens. Remove soap buildup in the tub or shower regularly. Attach bath mats  securely with double-sided non-slip rug tape. Do not have throw rugs and other things on the floor that can make you trip. What can I do in the bedroom? Use night lights. Make sure that you have a light by your bed that is easy to reach. Do not use any sheets or blankets that are too big for your bed. They should not hang down onto the floor. Have a firm chair that has side arms. You can use this for support while you get dressed. Do not have throw rugs and other things on the floor that can make you trip. What can I do in the kitchen? Clean up any spills right away. Avoid walking on wet floors. Keep items that you use a lot in easy-to-reach places. If you need to reach something above you, use a strong step stool that has a grab bar. Keep electrical cords out of the way. Do not use floor polish or wax that makes floors slippery. If you must use wax, use non-skid floor wax. Do not have throw rugs and other things on the floor that can make you trip. What can I do with my stairs? Do not leave any items on the stairs. Make sure that there are handrails on both sides of the stairs and  use them. Fix handrails that are broken or loose. Make sure that handrails are as long as the stairways. Check any carpeting to make sure that it is firmly attached to the stairs. Fix any carpet that is loose or worn. Avoid having throw rugs at the top or bottom of the stairs. If you do have throw rugs, attach them to the floor with carpet tape. Make sure that you have a light switch at the top of the stairs and the bottom of the stairs. If you do not have them, ask someone to add them for you. What else can I do to help prevent falls? Wear shoes that: Do not have high heels. Have rubber bottoms. Are comfortable and fit you well. Are closed at the toe. Do not wear sandals. If you use a stepladder: Make sure that it is fully opened. Do not climb a closed stepladder. Make sure that both sides of the stepladder  are locked into place. Ask someone to hold it for you, if possible. Clearly mark and make sure that you can see: Any grab bars or handrails. First and last steps. Where the edge of each step is. Use tools that help you move around (mobility aids) if they are needed. These include: Canes. Walkers. Scooters. Crutches. Turn on the lights when you go into a dark area. Replace any light bulbs as soon as they burn out. Set up your furniture so you have a clear path. Avoid moving your furniture around. If any of your floors are uneven, fix them. If there are any pets around you, be aware of where they are. Review your medicines with your doctor. Some medicines can make you feel dizzy. This can increase your chance of falling. Ask your doctor what other things that you can do to help prevent falls. This information is not intended to replace advice given to you by your health care provider. Make sure you discuss any questions you have with your health care provider. Document Released: 08/07/2009 Document Revised: 03/18/2016 Document Reviewed: 11/15/2014 Elsevier Interactive Patient Education  2017 [employer].

## 2023-06-07 NOTE — Progress Notes (Signed)
Subjective:   Allison [name] is a 82 y.o. female who presents for Medicare Annual (Subsequent) preventive examination.  Visit Complete: Virtual  I connected with  [name] on 06/07/23 by a audio enabled telemedicine application and verified that I am speaking with the correct person using two identifiers.  Patient Location: Home  Provider Location: Office/Clinic  I discussed the limitations of evaluation and management by telemedicine. The patient expressed understanding and agreed to proceed.  Vital Signs: Unable to obtain new vitals due to this being a telehealth visit.  Review of Systems     Cardiac Risk Factors include: advanced age (>47men, >47 women);hypertension     Objective:    Today's Vitals   There is no height or weight on file to calculate BMI.     06/07/2023   10:07 AM 06/25/2022    2:06 PM 08/19/2021    1:21 PM 06/04/2021    1:29 PM 11/21/2019    4:26 PM 09/07/2019    1:00 PM 09/04/2019   10:24 AM  Advanced Directives  Does Patient Have a Medical Advance Directive? Yes Yes No No No No No  Type of Advance Directive Living will Healthcare Power of [place];Living will       Copy of Healthcare Power of Attorney in Chart?  No - copy requested       Would patient like information on creating a medical advance directive?   No - Patient declined Yes (ED - Information included in AVS) No - Patient declined No - Patient declined No - Patient declined    Current Medications (verified) Outpatient Encounter Medications as of 06/07/2023  Medication Sig   donepezil (ARICEPT) 10 MG tablet Take 1 tablet (10 mg total) by mouth at bedtime.   levothyroxine (SYNTHROID) 88 MCG tablet Take 1 tablet (88 mcg total) by mouth daily.   Multiple Vitamin (MULTIVITAMIN ADULT) TABS Take 1 tablet by mouth daily.   rosuvastatin (CRESTOR) 20 MG tablet Take 1 tablet (20 mg total) by mouth daily.   nabumetone (RELAFEN) 750 MG tablet TAKE 1 TABLET(750 MG) BY MOUTH TWICE DAILY AS  NEEDED (Patient not taking: Reported on 10/07/2022)   omeprazole (PRILOSEC) 20 MG capsule Take 1 capsule by mouth daily (Patient not taking: Reported on 04/06/2023)   ondansetron (ZOFRAN ODT) 4 MG disintegrating tablet Take 1 tablet (4 mg total) by mouth every 8 (eight) hours as needed for nausea or vomiting. (Patient not taking: Reported on 05/04/2022)   tiZANidine (ZANAFLEX) 4 MG tablet TAKE 1 TABLET(4 MG) BY MOUTH EVERY 8 HOURS AS NEEDED FOR MUSCLE SPASMS (Patient not taking: Reported on 06/04/2021)   No facility-administered encounter medications on file as of 06/07/2023.    Allergies (verified) Niacin   History: Past Medical History:  Diagnosis Date   Arthritis    lower back   Former smoker    GERD (gastroesophageal reflux disease)    History of appendicitis    History of blood transfusion 1960   after delivery of son   History of cataract    History of kidney stones 1999   lithrotripsy   Hypercholesterolemia    Hyperplastic colon polyp    Hypertension    Hypothyroid    Iron deficiency anemia    Macular degeneration    bilateral   Pinched nerve    back   Seasonal allergies    RHINITIS   Tachycardia    Wears glasses    reading   Past Surgical History:  Procedure Laterality Date  ABDOMINAL HYSTERECTOMY     APPENDECTOMY     cataract surgery Bilateral 03/2014   polyps   COLONOSCOPY  02/2010   CYSTOSCOPY W/ URETERAL STENT PLACEMENT Right 09/07/2019   Procedure: CYSTOSCOPY WITH STENT REPLACEMENT WITH RETROGRADES;  Surgeon: Sebastian Ache, MD;  Location: [facility];  Service: Urology;  Laterality: Right;   CYSTOSCOPY/RETROGRADE/URETEROSCOPY Bilateral 07/18/2019   Procedure: CYSTOSCOPY/RETROGRADE/RIGHT URETEROSCOPY/BLADDER BIOPSY/STENT PLACEMENT/RIGHT URETERAL MASS BIOPSY;  Surgeon: Sebastian Ache, MD;  Location: [facility];  Service: Urology;  Laterality: Bilateral;  1 HR   Family History  Problem Relation Age of Onset   Arthritis Mother    Diabetes Mother     Arthritis Father    Diabetes Father    Hyperlipidemia Father    Colon cancer Brother 81   Colon polyps Brother 33   Cancer Paternal Grandmother    Heart failure Sister    Hyperlipidemia Sister    Rectal cancer Neg Hx    Stomach cancer Neg Hx    Esophageal cancer Neg Hx    Social History   Socioeconomic History   Marital status: Married    Spouse name: Not on file   Number of children: 1   Years of education: Not on file   Highest education level: Not on file  Occupational History   Occupation: retired  Tobacco Use   Smoking status: Former    Current packs/day: 0.00    Average packs/day: 1 pack/day for 20.0 years (20.0 ttl pk-yrs)    Types: Cigarettes    Start date: 10/24/1973    Quit date: 10/24/1993    Years since quitting: 29.6   Smokeless tobacco: Never  Vaping Use   Vaping status: Never Used  Substance and Sexual Activity   Alcohol use: Yes    Alcohol/week: 5.0 standard drinks of alcohol    Types: 5 Glasses of wine per week    Comment: 4-5 glasses of wine per week.    Drug use: No   Sexual activity: Not Currently    Birth control/protection: Post-menopausal, Surgical  Other Topics Concern   Not on file  Social History Narrative   Not on file   Social Determinants of Health   Financial Resource Strain: Low Risk  (06/07/2023)   Overall Financial Resource Strain (CARDIA)    Difficulty of Paying Living Expenses: Not hard at all  Food Insecurity: No Food Insecurity (06/07/2023)   Hunger Vital Sign    Worried About Running Out of Food in the Last Year: Never true    Ran Out of Food in the Last Year: Never true  Transportation Needs: No Transportation Needs (06/07/2023)   PRAPARE - Administrator, Civil Service (Medical): No    Lack of Transportation (Non-Medical): No  Physical Activity: Inactive (06/07/2023)   Exercise Vital Sign    Days of Exercise per Week: 0 days    Minutes of Exercise per Session: 0 min  Stress: No Stress Concern Present  (06/07/2023)   [employer] of Occupational Health - Occupational Stress Questionnaire    Feeling of Stress : Only a little  Social Connections: Moderately Integrated (06/07/2023)   Social Connection and Isolation Panel [NHANES]    Frequency of Communication with Friends and Family: More than three times a week    Frequency of Social Gatherings with Friends and Family: Once a week    Attends Religious Services: More than 4 times per year    Active Member of [employer] or Organizations: No    Attends [employer]  or Organization Meetings: Never    Marital Status: Married    Tobacco Counseling Counseling given: Not Answered   Clinical Intake:  Pre-visit preparation completed: Yes  Pain : No/denies pain     Nutritional Risks: Nausea/ vomitting/ diarrhea (diarrhea for a week) Diabetes: No  How often do you need to have someone help you when you read instructions, pamphlets, or other written materials from your doctor or pharmacy?: 1 - Never  Interpreter Needed?: No  Information entered by :: NAllen LPN   Activities of Daily Living    06/07/2023   10:02 AM 06/25/2022    2:09 PM  In your present state of health, do you have any difficulty performing the following activities:  Hearing? 0 0  Vision? 0 0  Difficulty concentrating or making decisions? 0 1  Walking or climbing stairs? 0 0  Dressing or bathing? 0 0  Doing errands, shopping? 0 0  Preparing Food and eating ? N N  Using the Toilet? N N  In the past six months, have you accidently leaked urine? N N  Do you have problems with loss of bowel control? Y N  Managing your Medications? N N  Managing your Finances? N N  Housekeeping or managing your Housekeeping? N N    Patient Care Team: Ronnald Nian, MD as PCP - General (Family Medicine)  Indicate any recent Medical Services you may have received from other than [facility] providers in the past year (date may be approximate).     Assessment:   This is a routine wellness  examination for [place].  Hearing/Vision screen Hearing Screening - Comments:: Denies hearing issues Vision Screening - Comments:: Regular eye exams, Dr. Genia Del  Dietary issues and exercise activities discussed:     Goals Addressed             This Visit's Progress    Patient Stated       06/07/2023, stay alive       Depression Screen    06/07/2023   10:08 AM 06/25/2022    2:08 PM 06/04/2021    1:30 PM 05/22/2020   11:06 AM 08/03/2018    1:50 PM 12/09/2016    9:22 AM 12/04/2015    9:57 AM  PHQ 2/9 Scores  PHQ - 2 Score 0 0 0 0 0 0 0  PHQ- 9 Score 0 0         Fall Risk    06/07/2023   10:07 AM 04/06/2023   11:47 AM 06/25/2022    2:07 PM 06/04/2021    1:29 PM 09/19/2019    9:42 AM  Fall Risk   Falls in the past year? 0 0 0 1 0  Comment     Emmi Telephone Survey: data to providers prior to load  Number falls in past yr: 0 0 0 1   Injury with Fall? 0 0 0 1   Risk for fall due to : Medication side effect No Fall Risks Medication side effect Impaired balance/gait   Follow up Falls prevention discussed;Falls evaluation completed Falls evaluation completed Falls prevention discussed;Falls evaluation completed;Education provided Falls prevention discussed;Falls evaluation completed;Education provided     MEDICARE RISK AT HOME:  Medicare Risk at Home - 06/07/23 1007     Any stairs in or around the home? Yes    If so, are there any without handrails? No    Home free of loose throw rugs in walkways, pet beds, electrical cords, etc? Yes    Adequate lighting  in your home to reduce risk of falls? Yes    Life alert? No    Use of a cane, walker or w/c? No    Grab bars in the bathroom? Yes    Shower chair or bench in shower? Yes    Elevated toilet seat or a handicapped toilet? Yes             TIMED UP AND GO:  Was the test performed?  No    Cognitive Function:    05/04/2022   10:31 AM  MMSE - Mini Mental State Exam  Orientation to time 4  Orientation to Place 5   Registration 3  Attention/ Calculation 5  Recall 3  Language- name 2 objects 2  Language- repeat 1  Language- follow 3 step command 3  Language- read & follow direction 1  Write a sentence 1  Copy design 1  Total score 29        06/07/2023   10:09 AM 06/25/2022    2:10 PM  6CIT Screen  What Year? 0 points 0 points  What month? 0 points 0 points  What time? 3 points 0 points  Count back from 20 0 points 0 points  Months in reverse 0 points 2 points  Repeat phrase 2 points 0 points  Total Score 5 points 2 points    Immunizations Immunization History  Administered Date(s) Administered   DTaP 05/13/1987   HPV Quadrivalent 04/30/1955, 06/01/1955, 01/14/1956   Influenza Split 08/09/2015   Influenza Whole 08/17/2007   Influenza, High Dose Seasonal PF 07/23/2016, 07/11/2017, 08/03/2018, 06/08/2019   Influenza-Unspecified 08/06/2021   PFIZER(Purple Top)SARS-COV-2 Vaccination 11/13/2019, 01/01/2020   Pfizer Covid-19 Vaccine Bivalent Booster 83yrs & up 08/21/2021   Pneumococcal Conjugate-13 12/04/2015   Pneumococcal Polysaccharide-23 08/17/2007, 10/21/2014, 09/08/2019   Tdap 12/04/2015   Zoster Recombinant(Shingrix) 02/24/2018, 08/14/2018, 06/20/2019   Zoster, Live 12/04/2015    TDAP status: Up to date  Flu Vaccine status: Due, Education has been provided regarding the importance of this vaccine. Advised may receive this vaccine at local pharmacy or Health Dept. Aware to provide a copy of the vaccination record if obtained from local pharmacy or Health Dept. Verbalized acceptance and understanding.  Pneumococcal vaccine status: Up to date  Covid-19 vaccine status: Information provided on how to obtain vaccines.   Qualifies for Shingles Vaccine? Yes   Zostavax completed Yes   Shingrix Completed?: Yes  Screening Tests Health Maintenance  Topic Date Due   Colonoscopy  07/07/2020   COVID-19 Vaccine (4 - 2023-24 season) 06/25/2022   INFLUENZA VACCINE  05/26/2023    Medicare Annual Wellness (AWV)  06/06/2024   DTaP/Tdap/Td (3 - Td or Tdap) 12/03/2025   Pneumonia Vaccine 37+ Years old  Completed   DEXA SCAN  Completed   HPV VACCINES  Completed   Zoster Vaccines- Shingrix  Completed    Health Maintenance  Health Maintenance Due  Topic Date Due   Colonoscopy  07/07/2020   COVID-19 Vaccine (4 - 2023-24 season) 06/25/2022   INFLUENZA VACCINE  05/26/2023    Colorectal cancer screening: No longer required.   Mammogram status: Completed 11/30/2022. Repeat every year  Bone Density status: Completed 10/27/2021.   Lung Cancer Screening: (Low Dose CT Chest recommended if Age 85-80 years, 20 pack-year currently smoking OR have quit w/in 15years.) does not qualify.   Lung Cancer Screening Referral: no  Additional Screening:  Hepatitis C Screening: does not qualify;   Vision Screening: Recommended annual ophthalmology exams for early detection of glaucoma  and other disorders of the eye. Is the patient up to date with their annual eye exam?  Yes  Who is the provider or what is the name of the office in which the patient attends annual eye exams? Dr. Genia Del If pt is not established with a provider, would they like to be referred to a provider to establish care? No .   Dental Screening: Recommended annual dental exams for proper oral hygiene  Diabetic Foot Exam: n/a  Community Resource Referral / Chronic Care Management: CRR required this visit?  No   CCM required this visit?  No     Plan:     I have personally reviewed and noted the following in the patient's chart:   Medical and social history Use of alcohol, tobacco or illicit drugs  Current medications and supplements including opioid prescriptions. Patient is not currently taking opioid prescriptions. Functional ability and status Nutritional status Physical activity Advanced directives List of other physicians Hospitalizations, surgeries, and ER visits in previous 12  months Vitals Screenings to include cognitive, depression, and falls Referrals and appointments  In addition, I have reviewed and discussed with patient certain preventive protocols, quality metrics, and best practice recommendations. A written personalized care plan for preventive services as well as general preventive health recommendations were provided to patient.     Barb Merino, LPN   12/08/863   After Visit Summary: (Pick Up) Due to this being a telephonic visit, with patients personalized plan was offered to patient and patient has requested to Pick up at office.  Nurse Notes: none

## 2023-06-13 ENCOUNTER — Other Ambulatory Visit: Payer: PPO

## 2023-06-13 DIAGNOSIS — E039 Hypothyroidism, unspecified: Secondary | ICD-10-CM | POA: Diagnosis not present

## 2023-06-14 LAB — TSH: TSH: 12 u[IU]/mL — ABNORMAL HIGH (ref 0.450–4.500)

## 2023-06-14 MED ORDER — LEVOTHYROXINE SODIUM 100 MCG PO TABS
100.0000 ug | ORAL_TABLET | Freq: Every day | ORAL | 0 refills | Status: AC
Start: 2023-06-14 — End: ?

## 2023-06-14 NOTE — Addendum Note (Signed)
Addended by: Ronnald Nian on: 06/14/2023 01:06 PM   Modules accepted: Orders

## 2023-07-25 ENCOUNTER — Telehealth: Payer: Self-pay | Admitting: Family Medicine

## 2023-07-25 NOTE — Telephone Encounter (Signed)
Pt is coming in on 10/21 to have her TSH rechecked and she called stating she only has 4 thyroid pills left and wants to know if she needs to come in sooner for lab work or can you send in a refill until her lab visit.

## 2023-07-26 MED ORDER — LEVOTHYROXINE SODIUM 100 MCG PO TABS: 100.0000 ug | ORAL_TABLET | Freq: Every day | ORAL | 0 refills | Status: DC

## 2023-07-27 NOTE — Telephone Encounter (Signed)
Pt called upset that she had left 2 messages and no one had called her back.  I gave her info of her refill and appointment.

## 2023-08-15 ENCOUNTER — Other Ambulatory Visit: Payer: PPO

## 2023-08-19 ENCOUNTER — Other Ambulatory Visit: Payer: PPO

## 2023-08-19 DIAGNOSIS — E039 Hypothyroidism, unspecified: Secondary | ICD-10-CM | POA: Diagnosis not present

## 2023-08-20 LAB — TSH: TSH: 0.613 u[IU]/mL (ref 0.450–4.500)

## 2023-08-23 ENCOUNTER — Other Ambulatory Visit: Payer: Self-pay | Admitting: Family Medicine

## 2023-08-29 ENCOUNTER — Telehealth: Payer: Self-pay | Admitting: Family Medicine

## 2023-08-29 NOTE — Telephone Encounter (Signed)
Pt called stating she hasn't gotten her lab results, I advised they were sent on mychart & she said she hasn't used that in years so I deactivated it & gave her results & advised meds sent to pharmacy

## 2023-09-26 ENCOUNTER — Telehealth: Payer: Self-pay | Admitting: Family Medicine

## 2023-09-26 MED ORDER — LEVOTHYROXINE SODIUM 100 MCG PO TABS: 100.0000 ug | ORAL_TABLET | Freq: Every day | ORAL | 3 refills | Status: DC

## 2023-09-26 NOTE — Telephone Encounter (Signed)
Pt wants her thyroid meds sent to mail order like her other meds [facility] powered by [employer]

## 2023-09-29 ENCOUNTER — Telehealth: Payer: Self-pay | Admitting: Family Medicine

## 2023-09-29 NOTE — Telephone Encounter (Signed)
Pt left a message, she was upset.  She states she called here 3 times and for me to give her a number for another office that could help her.  I reviewed chart. It appears she spoke with Melissa who took care of her.  I ret call to pt reached voice mail, lmtrc and need to know who she has spoken with 3 times so that I may investigate and retrain if needed.

## 2023-12-06 DIAGNOSIS — Z1231 Encounter for screening mammogram for malignant neoplasm of breast: Secondary | ICD-10-CM | POA: Diagnosis not present

## 2023-12-06 LAB — HM MAMMOGRAPHY

## 2023-12-07 ENCOUNTER — Encounter: Payer: Self-pay | Admitting: Family Medicine

## 2024-01-10 DIAGNOSIS — H353190 Nonexudative age-related macular degeneration, unspecified eye, stage unspecified: Secondary | ICD-10-CM | POA: Diagnosis not present

## 2024-01-17 ENCOUNTER — Encounter: Payer: Self-pay | Admitting: Family Medicine

## 2024-01-17 ENCOUNTER — Ambulatory Visit (INDEPENDENT_AMBULATORY_CARE_PROVIDER_SITE_OTHER): Admitting: Family Medicine

## 2024-01-17 VITALS — BP 150/80 | HR 72 | Ht 65.0 in | Wt 129.0 lb

## 2024-01-17 DIAGNOSIS — R413 Other amnesia: Secondary | ICD-10-CM

## 2024-01-17 DIAGNOSIS — F09 Unspecified mental disorder due to known physiological condition: Secondary | ICD-10-CM

## 2024-01-17 DIAGNOSIS — Z8616 Personal history of COVID-19: Secondary | ICD-10-CM | POA: Diagnosis not present

## 2024-01-17 DIAGNOSIS — E039 Hypothyroidism, unspecified: Secondary | ICD-10-CM | POA: Diagnosis not present

## 2024-01-17 MED ORDER — DONEPEZIL HCL 10 MG PO TABS: ORAL_TABLET | ORAL | 3 refills | Status: DC

## 2024-01-17 NOTE — Progress Notes (Signed)
   Subjective:    Patient ID: Allison [name], female    DOB: August 01, 1941, 83 y.o.   MRN: [id number]  HPI She is here for further evaluation of cognitive dysfunction.  She states that she has noticed this since she has had COVID several years ago.  She has been on Aricept for approximately 1 year but did not come back for follow-up visit concerning that.  She also has underlying hypothyroidism. Her son now has POA.  Review of Systems     Objective:    Physical Exam Alert and slightly confused on person place and time.,  MMSE of 27.. Tympanic membranes and canals are normal. Pharyngeal area is normal. Neck is supple without adenopathy or thyromegaly. Cardiac exam shows a regular sinus rhythm without murmurs or gallops. Lungs are clear to auscultation.        Assessment & Plan:  Hypothyroidism, unspecified type  Memory changes - Plan: donepezil (ARICEPT) 10 MG tablet  History of COVID-19  Cognitive dysfunction I will increase her Aricept to 20 mg.  I talked to her son and had recommend that he call me in 1 month to let me know how she is doing on the higher dose and might need to add another medication to the regimen.

## 2024-01-17 NOTE — Patient Instructions (Signed)
 Call in 1 month to let me know how you are doing

## 2024-01-18 ENCOUNTER — Encounter: Payer: Self-pay | Admitting: Family Medicine

## 2024-01-18 ENCOUNTER — Telehealth: Payer: Self-pay | Admitting: Family Medicine

## 2024-01-18 NOTE — Telephone Encounter (Signed)
 FMLA paperwork was completed for son Allison [name] t# [phone], called son & informed & they will pick up paperwork instead of mailing

## 2024-02-14 ENCOUNTER — Encounter: Payer: Self-pay | Admitting: Family Medicine

## 2024-02-14 DIAGNOSIS — R413 Other amnesia: Secondary | ICD-10-CM

## 2024-02-14 MED ORDER — DONEPEZIL HCL 10 MG PO TABS: ORAL_TABLET | ORAL | 3 refills | Status: DC

## 2024-03-06 ENCOUNTER — Other Ambulatory Visit: Payer: Self-pay

## 2024-03-06 DIAGNOSIS — R413 Other amnesia: Secondary | ICD-10-CM

## 2024-03-06 MED ORDER — DONEPEZIL HCL 10 MG PO TABS: ORAL_TABLET | ORAL | 3 refills | Status: AC

## 2024-05-07 ENCOUNTER — Telehealth: Payer: Self-pay | Admitting: Family Medicine

## 2024-05-07 NOTE — Telephone Encounter (Signed)
 SABRA

## 2024-05-08 ENCOUNTER — Other Ambulatory Visit: Payer: Self-pay | Admitting: Family Medicine

## 2024-05-08 DIAGNOSIS — I700 Atherosclerosis of aorta: Secondary | ICD-10-CM

## 2024-05-08 MED ORDER — ROSUVASTATIN CALCIUM 20 MG PO TABS: 20.0000 mg | ORAL_TABLET | Freq: Every day | ORAL | 3 refills | Status: AC

## 2024-06-12 ENCOUNTER — Ambulatory Visit: Payer: PPO

## 2024-06-12 DIAGNOSIS — Z0000 Encounter for general adult medical examination without abnormal findings: Secondary | ICD-10-CM

## 2024-06-12 NOTE — Progress Notes (Signed)
 Subjective:   Allison [name] is a 83 y.o. who presents for a Medicare Wellness preventive visit.  As a reminder, Annual Wellness Visits don't include a physical exam, and some assessments may be limited, especially if this visit is performed virtually. We may recommend an in-person follow-up visit with your provider if needed.  Visit Complete: Virtual I connected with  Allison [name] on 06/12/24 by a audio enabled telemedicine application and verified that I am speaking with the correct person using two identifiers.  Patient Location: Home  Provider Location: Office/Clinic  I discussed the limitations of evaluation and management by telemedicine. The patient expressed understanding and agreed to proceed.  Vital Signs: Because this visit was a virtual/telehealth visit, some criteria may be missing or patient reported. Any vitals not documented were not able to be obtained and vitals that have been documented are patient reported.  VideoError- Librarian, academic were attempted between this provider and patient, however failed, due to patient having technical difficulties OR patient did not have access to video capability.  We continued and completed visit with audio only.   Persons Participating in Visit: Patient assisted by son.  AWV Questionnaire: Yes: Patient Medicare AWV questionnaire was completed by the patient on 06/11/2024; I have confirmed that all information answered by patient is correct and no changes since this date.  Cardiac Risk Factors include: advanced age (>34men, >4 women);hypertension     Objective:    Today's Vitals   There is no height or weight on file to calculate BMI.     06/12/2024   10:52 AM 06/07/2023   10:07 AM 06/25/2022    2:06 PM 08/19/2021    1:21 PM 06/04/2021    1:29 PM 11/21/2019    4:26 PM 09/07/2019    1:00 PM  Advanced Directives  Does Patient Have a Medical Advance Directive? Yes Yes Yes No No No No  Type of  Estate agent of [place];Living will Living will Healthcare Power of [place];Living will      Copy of Healthcare Power of Attorney in Chart? Yes - validated most recent copy scanned in chart (See row information)  No - copy requested      Would patient like information on creating a medical advance directive?    No - Patient declined Yes (ED - Information included in AVS) No - Patient declined No - Patient declined    Current Medications (verified) Outpatient Encounter Medications as of 06/12/2024  Medication Sig   donepezil  (ARICEPT ) 10 MG tablet Take 2 tablets at bedtime   levothyroxine  (SYNTHROID ) 100 MCG tablet Take 1 tablet (100 mcg total) by mouth daily before breakfast.   Multiple Vitamin (MULTIVITAMIN ADULT) TABS Take 1 tablet by mouth daily.   rosuvastatin  (CRESTOR ) 20 MG tablet Take 1 tablet (20 mg total) by mouth daily.   No facility-administered encounter medications on file as of 06/12/2024.    Allergies (verified) Niacin   History: Past Medical History:  Diagnosis Date   Arthritis    lower back   Former smoker    GERD (gastroesophageal reflux disease)    History of appendicitis    History of blood transfusion 1960   after delivery of son   History of cataract    History of kidney stones 1999   lithrotripsy   Hypercholesterolemia    Hyperplastic colon polyp    Hypertension    Hypothyroid    Iron deficiency anemia    Macular degeneration  bilateral   Pinched nerve    back   Seasonal allergies    RHINITIS   Tachycardia    Wears glasses    reading   Past Surgical History:  Procedure Laterality Date   ABDOMINAL HYSTERECTOMY     APPENDECTOMY     cataract surgery Bilateral 03/2014   polyps   COLONOSCOPY  02/2010   CYSTOSCOPY W/ URETERAL STENT PLACEMENT Right 09/07/2019   Procedure: CYSTOSCOPY WITH STENT REPLACEMENT WITH RETROGRADES;  Surgeon: Alvaro Hummer, MD;  Location: [facility];  Service: Urology;  Laterality: Right;    CYSTOSCOPY/RETROGRADE/URETEROSCOPY Bilateral 07/18/2019   Procedure: CYSTOSCOPY/RETROGRADE/RIGHT URETEROSCOPY/BLADDER BIOPSY/STENT PLACEMENT/RIGHT URETERAL MASS BIOPSY;  Surgeon: Alvaro Hummer, MD;  Location: [facility];  Service: Urology;  Laterality: Bilateral;  1 HR   Family History  Problem Relation Age of Onset   Arthritis Mother    Diabetes Mother    Arthritis Father    Diabetes Father    Hyperlipidemia Father    Colon cancer Brother 34   Colon polyps Brother 36   Cancer Paternal Grandmother    Heart failure Sister    Hyperlipidemia Sister    Rectal cancer Neg Hx    Stomach cancer Neg Hx    Esophageal cancer Neg Hx    Social History   Socioeconomic History   Marital status: Married    Spouse name: Not on file   Number of children: 1   Years of education: Not on file   Highest education level: Bachelor's degree (e.g., BA, AB, BS)  Occupational History   Occupation: retired  Tobacco Use   Smoking status: Former    Current packs/day: 0.00    Average packs/day: 1 pack/day for 20.0 years (20.0 ttl pk-yrs)    Types: Cigarettes    Start date: 10/24/1973    Quit date: 10/24/1993    Years since quitting: 30.6   Smokeless tobacco: Never  Vaping Use   Vaping status: Never Used  Substance and Sexual Activity   Alcohol use: Yes    Alcohol/week: 5.0 standard drinks of alcohol    Types: 5 Glasses of wine per week    Comment: 4-5 glasses of wine per week.    Drug use: No   Sexual activity: Not Currently    Birth control/protection: Post-menopausal, Surgical  Other Topics Concern   Not on file  Social History Narrative   Not on file   Social Drivers of Health   Financial Resource Strain: Low Risk  (06/11/2024)   Overall Financial Resource Strain (CARDIA)    Difficulty of Paying Living Expenses: Not hard at all  Food Insecurity: No Food Insecurity (06/11/2024)   Hunger Vital Sign    Worried About Running Out of Food in the Last Year: Never true    Ran  Out of Food in the Last Year: Never true  Transportation Needs: No Transportation Needs (06/11/2024)   PRAPARE - Administrator, Civil Service (Medical): No    Lack of Transportation (Non-Medical): No  Physical Activity: Insufficiently Active (06/11/2024)   Exercise Vital Sign    Days of Exercise per Week: 7 days    Minutes of Exercise per Session: 20 min  Stress: No Stress Concern Present (06/11/2024)   [employer] of Occupational Health - Occupational Stress Questionnaire    Feeling of Stress: Only a little  Social Connections: Socially Isolated (06/11/2024)   Social Connection and Isolation Panel    Frequency of Communication with Friends and Family: Once a week  Frequency of Social Gatherings with Friends and Family: Once a week    Attends Religious Services: Patient declined    Active Member of Clubs or Organizations: No    Attends Engineer, structural: Not on file    Marital Status: Widowed    Tobacco Counseling Counseling given: Not Answered    Clinical Intake:  Pre-visit preparation completed: Yes  Pain : No/denies pain     Nutritional Risks: None Diabetes: No  Lab Results  Component Value Date   HGBA1C 5.6 02/04/2016     How often do you need to have someone help you when you read instructions, pamphlets, or other written materials from your doctor or pharmacy?: 1 - Never  Interpreter Needed?: No  Information entered by :: NAllen LPN   Activities of Daily Living     06/11/2024   11:37 AM  In your present state of health, do you have any difficulty performing the following activities:  Hearing? 1  Comment no hearing aids  Vision? 1   Difficulty concentrating or making decisions? 1  Comment memory issues  Walking or climbing stairs? 0   Dressing or bathing? 0   Doing errands, shopping? 1   Preparing Food and eating ? N   Using the Toilet? N   In the past six months, have you accidently leaked urine? N   Do you have  problems with loss of bowel control? N   Managing your Medications? N   Managing your Finances? Y   Housekeeping or managing your Housekeeping? N      Proxy-reported    Patient Care Team: Joyce Norleen BROCKS, MD as PCP - General (Family Medicine)  I have updated your Care Teams any recent Medical Services you may have received from other providers in the past year.     Assessment:   This is a routine wellness examination for [place].  Hearing/Vision screen Hearing Screening - Comments:: Trouble hearing issues no hearing aids Vision Screening - Comments:: No regular eye exams   Goals Addressed             This Visit's Progress    Patient Stated       06/12/2024, stay alive       Depression Screen     06/12/2024   10:54 AM 06/07/2023   10:08 AM 06/25/2022    2:08 PM 06/04/2021    1:30 PM 05/22/2020   11:06 AM 08/03/2018    1:50 PM 12/09/2016    9:22 AM  PHQ 2/9 Scores  PHQ - 2 Score 2 0 0 0 0 0 0  PHQ- 9 Score 2 0 0        Fall Risk     06/11/2024   11:37 AM 06/07/2023   10:07 AM 04/06/2023   11:47 AM 06/25/2022    2:07 PM 06/04/2021    1:29 PM  Fall Risk   Falls in the past year? 1  0 0 0 1  Number falls in past yr: 0  0 0 0 1  Injury with Fall? 0  0 0 0 1  Risk for fall due to :  Medication side effect No Fall Risks Medication side effect Impaired balance/gait  Follow up  Falls prevention discussed;Falls evaluation completed Falls evaluation completed Falls prevention discussed;Falls evaluation completed;Education provided  Falls prevention discussed;Falls evaluation completed;Education provided      Proxy-reported   Data saved with a previous flowsheet row definition    MEDICARE RISK AT HOME:  Medicare Risk  at Home Any stairs in or around the home?: (Proxy-Rptd) Yes If so, are there any without handrails?: (Proxy-Rptd) No Home free of loose throw rugs in walkways, pet beds, electrical cords, etc?: (Proxy-Rptd) Yes Adequate lighting in your home to reduce risk of  falls?: (Proxy-Rptd) Yes Life alert?: (Proxy-Rptd) No Use of a cane, walker or w/c?: (Proxy-Rptd) No Grab bars in the bathroom?: (Proxy-Rptd) Yes Shower chair or bench in shower?: (Proxy-Rptd) No Elevated toilet seat or a handicapped toilet?: (Proxy-Rptd) Yes  TIMED UP AND GO:  Was the test performed?  No  Cognitive Function: Impaired: Patient has current diagnosis of cognitive impairment. Currently on aricept .     01/17/2024    2:43 PM 05/04/2022   10:31 AM  MMSE - Mini Mental State Exam  Orientation to time 2 4  Orientation to Place 5 5  Registration 3 3  Attention/ Calculation 5 5  Recall 3 3  Language- name 2 objects 2 2  Language- repeat 1 1  Language- follow 3 step command 3 3  Language- read & follow direction 1 1  Write a sentence 1 1  Copy design 1 1  Total score 27 29        06/07/2023   10:09 AM 06/25/2022    2:10 PM  6CIT Screen  What Year? 0 points 0 points  What month? 0 points 0 points  What time? 3 points 0 points  Count back from 20 0 points 0 points  Months in reverse 0 points 2 points  Repeat phrase 2 points 0 points  Total Score 5 points 2 points    Immunizations Immunization History  Administered Date(s) Administered   DTaP 05/13/1987   HPV Quadrivalent 04/30/1955, 06/01/1955, 01/14/1956   Influenza Split 08/09/2015   Influenza Whole 08/17/2007   Influenza, High Dose Seasonal PF 07/23/2016, 07/11/2017, 08/03/2018, 06/08/2019   Influenza-Unspecified 08/06/2021   PFIZER(Purple Top)SARS-COV-2 Vaccination 11/13/2019, 01/01/2020   Pfizer Covid-19 Vaccine Bivalent Booster 79yrs & up 08/21/2021   Pneumococcal Conjugate-13 12/04/2015   Pneumococcal Polysaccharide-23 08/17/2007, 10/21/2014, 09/08/2019   Tdap 12/04/2015   Zoster Recombinant(Shingrix) 02/24/2018, 08/14/2018, 06/20/2019   Zoster, Live 12/04/2015    Screening Tests Health Maintenance  Topic Date Due   Colonoscopy  07/07/2020   COVID-19 Vaccine (4 - 2024-25 season) 06/26/2023    INFLUENZA VACCINE  05/25/2024   Medicare Annual Wellness (AWV)  06/12/2025   DTaP/Tdap/Td (3 - Td or Tdap) 12/03/2025   Pneumococcal Vaccine: 50+ Years  Completed   DEXA SCAN  Completed   HPV VACCINES  Completed   Zoster Vaccines- Shingrix  Completed   Meningococcal B Vaccine  Aged Out    Health Maintenance  Health Maintenance Due  Topic Date Due   Colonoscopy  07/07/2020   COVID-19 Vaccine (4 - 2024-25 season) 06/26/2023   INFLUENZA VACCINE  05/25/2024   Health Maintenance Items Addressed: Colonoscopy no longer needed. Due for covid and flu vaccine.  Additional Screening:  Vision Screening: Recommended annual ophthalmology exams for early detection of glaucoma and other disorders of the eye. Would you like a referral to an eye doctor? No    Dental Screening: Recommended annual dental exams for proper oral hygiene  Community Resource Referral / Chronic Care Management: CRR required this visit?  No   CCM required this visit?  No   Plan:    I have personally reviewed and noted the following in the patient's chart:   Medical and social history Use of alcohol, tobacco or illicit drugs  Current medications  and supplements including opioid prescriptions. Patient is not currently taking opioid prescriptions. Functional ability and status Nutritional status Physical activity Advanced directives List of other physicians Hospitalizations, surgeries, and ER visits in previous 12 months Vitals Screenings to include cognitive, depression, and falls Referrals and appointments  In addition, I have reviewed and discussed with patient certain preventive protocols, quality metrics, and best practice recommendations. A written personalized care plan for preventive services as well as general preventive health recommendations were provided to patient.   Allison FORBES Dawn, LPN   1/80/7974   After Visit Summary: (MyChart) Due to this being a telephonic visit, the after visit summary  with patients personalized plan was offered to patient via MyChart   Notes: Nothing significant to report at this time.

## 2024-06-12 NOTE — Patient Instructions (Signed)
 Ms. Roca , Thank you for taking time out of your busy schedule to complete your Annual Wellness Visit with me. I enjoyed our conversation and look forward to speaking with you again next year. I, as well as your care team,  appreciate your ongoing commitment to your health goals. Please review the following plan we discussed and let me know if I can assist you in the future. Your Game plan/ To Do List    Referrals: If you haven't heard from the office you've been referred to, please reach out to them at the phone provided.   Follow up Visits: We will see or speak with you next year for your Next Medicare AWV with our clinical staff Have you seen your provider in the last 6 months (3 months if uncontrolled diabetes)? Yes  Clinician Recommendations:  Aim for 30 minutes of exercise or brisk walking, 6-8 glasses of water , and 5 servings of fruits and vegetables each day.       This is a list of the screenings recommended for you:  Health Maintenance  Topic Date Due   Colon Cancer Screening  07/07/2020   COVID-19 Vaccine (4 - 2024-25 season) 06/26/2023   Flu Shot  05/25/2024   Medicare Annual Wellness Visit  06/12/2025   DTaP/Tdap/Td vaccine (3 - Td or Tdap) 12/03/2025   Pneumococcal Vaccine for age over 69  Completed   DEXA scan (bone density measurement)  Completed   HPV Vaccine  Completed   Zoster (Shingles) Vaccine  Completed   Meningitis B Vaccine  Aged Out    Advanced directives: (In Chart) A copy of your advanced directives are scanned into your chart should your provider ever need it. Advance Care Planning is important because it:  [x]  Makes sure you receive the medical care that is consistent with your values, goals, and preferences  [x]  It provides guidance to your family and loved ones and reduces their decisional burden about whether or not they are making the right decisions based on your wishes.  Follow the link provided in your after visit summary or read over the  paperwork we have mailed to you to help you started getting your Advance Directives in place. If you need assistance in completing these, please reach out to us  so that we can help you!  See attachments for Preventive Care and Fall Prevention Tips.

## 2024-06-20 ENCOUNTER — Encounter: Payer: Self-pay | Admitting: Family Medicine

## 2024-06-20 ENCOUNTER — Ambulatory Visit (INDEPENDENT_AMBULATORY_CARE_PROVIDER_SITE_OTHER): Admitting: Family Medicine

## 2024-06-20 VITALS — BP 160/80 | HR 88 | Ht 65.0 in | Wt 118.6 lb

## 2024-06-20 DIAGNOSIS — F410 Panic disorder [episodic paroxysmal anxiety] without agoraphobia: Secondary | ICD-10-CM

## 2024-06-20 DIAGNOSIS — Z7189 Other specified counseling: Secondary | ICD-10-CM

## 2024-06-20 DIAGNOSIS — F09 Unspecified mental disorder due to known physiological condition: Secondary | ICD-10-CM

## 2024-06-20 MED ORDER — MEMANTINE HCL 10 MG PO TABS: 10.0000 mg | ORAL_TABLET | Freq: Two times a day (BID) | ORAL | 1 refills | Status: DC

## 2024-06-20 NOTE — Progress Notes (Signed)
   Subjective:    Patient ID: Allison [name], female    DOB: 04/30/1941, 83 y.o.   MRN: [id number]  Discussed the use of AI scribe software for clinical note transcription with the patient, who gave verbal consent to proceed.  History of Present Illness   Allison [name] is an 83 year old female with dementia who presents for a follow-up visit. She is accompanied by her son, who is her primary caregiver.  She has been experiencing cognitive decline, currently managed with Aricept  (donepezil ) at the maximum dose of two tablets at bedtime. Her son is actively involved in her care and decision-making process.  Her husband passed away two months ago, which has been a significant emotional challenge for her. She describes this period as 'about as rough as anything I've ever experienced.'  She has been managing her daily activities with the help of her son, who lives five minutes away. She has not been to a grocery store in approximately twenty years, as her husband used to handle this task. Recently, she experienced a panic attack during a visit to [facility] when separated from her companion. However, she managed to go to the grocery store by herself on a subsequent occasion.  There is ongoing stress related to her home environment, as her backyard is in disarray due to a septic tank installation and county involvement. This situation has been a source of daily stress for her.  Her son has ensured that all legal documents, including power of attorney and wills, are in place to manage her affairs. She has considered the possibility of assisted living, but financial and logistical considerations are being evaluated.           Review of Systems     Objective:    Physical Exam Alert and in distress.  Oriented x 3.             Assessment & Plan:  Assessment and Plan    Dementia Dementia with cognitive decline managed with Aricept .  improvement. Referred to care management for  dementia-related needs. - Add Namenda  to current medication regimen. - Schedule virtual visit in one month to assess response to Namenda . - Refer to care management for assessment of needs related to dementia and social determinants of health.  Panic attacks Recent panic attack likely due to stress and routine changes after spouse's death.  Grief due to recent loss of spouse Grief with significant emotional distress. Encouraged bereavement counseling for support. - Encourage participation in bereavement counseling through hospice services.  Goals of Care Discussed future living arrangements, legal and financial considerations for assisted living. Power of attorney and wills in place. - Consider assisted living as a future option depending on her ability to manage independently. - Consult with a lawyer regarding elder care issues and financial planning for potential assisted living transition.

## 2024-06-21 ENCOUNTER — Telehealth: Payer: Self-pay

## 2024-06-21 NOTE — Progress Notes (Signed)
 Complex Care Management Note  Care Guide Note 06/21/2024 Name: Allison [name]: [id number] DOB: 1941-03-05  Allison [name] is a 83 y.o. year old female who sees Joyce Norleen BROCKS, MD for primary care. I reached out to Rock LITTIE Piety by phone today to offer complex care management services.  [name] was given information about Complex Care Management services today including:   The Complex Care Management services include support from the care team which includes your Nurse Care Manager, Clinical Social Worker, or Pharmacist.  The Complex Care Management team is here to help remove barriers to the health concerns and goals most important to you. Complex Care Management services are voluntary, and the patient may decline or stop services at any time by request to their care team member.   Complex Care Management Consent Status: Patient agreed to services and verbal consent obtained.   Follow up plan:  Telephone appointment with complex care management team member scheduled for:  07/09/24 @ 10 AM  Encounter Outcome:  Patient Scheduled  Leotis Rase [facility] Guide  Direct Dial: [phone]  Fax [phone]

## 2024-06-26 ENCOUNTER — Telehealth: Payer: Self-pay

## 2024-06-26 NOTE — Progress Notes (Signed)
   Telephone encounter was:  Successful.  Complex Care Management Note Care Guide Note  06/26/2024 Name: Allison [name]: [id number] DOB: Nov 04, 1940  Allison [name] is a 83 y.o. year old female who is a primary care patient of Joyce Norleen BROCKS, MD . The community resource team was consulted for assistance with Transportation Needs   SDOH screenings and interventions completed:  No        Care guide performed the following interventions: Patient provided with information about care guide support team and interviewed to confirm resource needs.Todd Pinal son requested to call back   Follow Up Plan:  Care guide will follow up with patient by phone over the next 72 hr  Encounter Outcome:  Patient Request to Call Back    Jon Colt [facility] Guide, Phone: [phone] Fax: [phone] Website: [email or web address]

## 2024-06-29 ENCOUNTER — Telehealth: Payer: Self-pay

## 2024-06-29 NOTE — Progress Notes (Signed)
   Telephone encounter was:  Unsuccessful.  06/29/2024 Name: DEVANY AJA MRN: [id number] DOB: 02-03-1941  Unsuccessful outbound call made today to assist with:  Transportation Needs   Outreach Attempt:  2nd Attempt  A HIPAA compliant voice message was left requesting a return call.  Instructed patient to call back    Jon Colt [facility] Guide, Phone: [phone] Fax: [phone] Website: [email or web address]

## 2024-07-02 ENCOUNTER — Telehealth: Payer: Self-pay

## 2024-07-02 NOTE — Progress Notes (Signed)
   Telephone encounter was:  Unsuccessful.  07/02/2024 Name: Allison [name]: [id number] DOB: 08/06/1941  Unsuccessful outbound call made today to assist with:  Transportation Needs   Outreach Attempt:  3rd Attempt.  Referral closed unable to contact patient.  A HIPAA compliant voice message was left requesting a return call.  Instructed patient to call back   Jon Colt [facility] Guide, Phone: [phone] Fax: [phone] Website: [email or web address]

## 2024-07-09 ENCOUNTER — Encounter: Payer: Self-pay | Admitting: Family Medicine

## 2024-07-09 ENCOUNTER — Other Ambulatory Visit: Payer: Medicare (Managed Care) | Admitting: Licensed Clinical Social Worker

## 2024-07-09 NOTE — Patient Instructions (Signed)
 Visit Information  Thank you for taking time to visit with me today. Please don't hesitate to contact me if I can be of assistance to you before our next scheduled appointment.  Our next appointment is by telephone on 10/13 at 10 AM Please call the care guide team at [phone] if you need to cancel or reschedule your appointment.   Following is a copy of your care plan:   Goals Addressed             This Visit's Progress    LCSW VBC[facility] Social Work Care Plan       Problems:   Disease Management support and education needs related to Grief  CSW Clinical Goal(s):   Over the next 90 days the Patient will attend all scheduled medical appointments as evidenced by patient report and care team review of appointment completion in electronic MEDICAL RECORD NUMBER  work with Social Worker to address concerns related to grief.  Interventions:  Mental Health:  Evaluation of current treatment plan related to Grief Active listening / Reflection utilized Emotional Support Provided Mindfulness or Relaxation training provided Provided general psycho-education for mental health needs Quality of sleep assessed & Sleep Hygiene techniques promoted  Patient Goals/Self-Care Activities:  Continue taking your medication as prescribed.   Increase healthy habits to assist with promoting sleep hygiene  Plan:   Telephone follow up appointment with care management team member scheduled for:  4 weeks        Please call the Suicide and Crisis Lifeline: 988 go to [facility] Urgent [facility] [address], [place] [phone]) call 911 if you are experiencing a Mental Health or Behavioral Health Crisis or need someone to talk to.  Patient verbalizes understanding of instructions and care plan provided today and agrees to view in MyChart. Active MyChart status and patient understanding of how to access instructions and care plan via MyChart confirmed with patient.     Allison [name] [facility]  [facility], [facility] Clinical Social Worker Direct Dial: [phone]  Fax: [phone] Website: [email or web address] 10:50 AM

## 2024-07-10 ENCOUNTER — Telehealth: Payer: Self-pay | Admitting: Family Medicine

## 2024-07-10 ENCOUNTER — Other Ambulatory Visit: Payer: Self-pay

## 2024-07-10 DIAGNOSIS — E039 Hypothyroidism, unspecified: Secondary | ICD-10-CM

## 2024-07-10 MED ORDER — LEVOTHYROXINE SODIUM 100 MCG PO TABS
100.0000 ug | ORAL_TABLET | Freq: Every day | ORAL | 0 refills | Status: DC
Start: 2024-07-10 — End: 2024-07-18

## 2024-07-10 NOTE — Telephone Encounter (Signed)
 Fax from [facility]  Synthroid  100 mcg  # 90

## 2024-07-11 NOTE — Telephone Encounter (Signed)
 Done

## 2024-07-17 ENCOUNTER — Encounter: Payer: Self-pay | Admitting: Family Medicine

## 2024-07-17 ENCOUNTER — Ambulatory Visit (INDEPENDENT_AMBULATORY_CARE_PROVIDER_SITE_OTHER): Admitting: Family Medicine

## 2024-07-17 VITALS — BP 144/80 | HR 76 | Ht 65.0 in | Wt 118.0 lb

## 2024-07-17 DIAGNOSIS — Z23 Encounter for immunization: Secondary | ICD-10-CM | POA: Diagnosis not present

## 2024-07-17 DIAGNOSIS — F09 Unspecified mental disorder due to known physiological condition: Secondary | ICD-10-CM

## 2024-07-17 DIAGNOSIS — E039 Hypothyroidism, unspecified: Secondary | ICD-10-CM

## 2024-07-17 NOTE — Progress Notes (Signed)
   Subjective:    Patient ID: Allison [name], female    DOB: 09/25/1941, 83 y.o.   MRN: [id number]  HPI She is here for a recheck.  She was placed on Namenda  but found that it made her more confused and she stopped taking the medication.  She seems to be much more stable now.  Her son is with her and he agrees that at the present time she seems to be doing okay. She also continues on her thyroid  medication.  Her last study was done in October of last year.   Review of Systems     Objective:    Physical Exam Alert and in no distress otherwise not examined       Assessment & Plan:  Hypothyroidism, unspecified type - Plan: TSH  Immunization due - Plan: Flu vaccine HIGH DOSE PF(Fluzone Trivalent)  Cognitive dysfunction Since she seems to be fairly stable at the present time, I would not pursue placing her on a different medication to be added onto the Aricept .  She and her son are very comfortable with that. TSH ordered.

## 2024-07-18 ENCOUNTER — Ambulatory Visit: Payer: Self-pay | Admitting: Family Medicine

## 2024-07-18 LAB — TSH: TSH: 0.976 u[IU]/mL (ref 0.450–4.500)

## 2024-07-18 MED ORDER — LEVOTHYROXINE SODIUM 100 MCG PO TABS: 100.0000 ug | ORAL_TABLET | Freq: Every day | ORAL | 3 refills | Status: AC

## 2024-07-18 NOTE — Addendum Note (Signed)
 Addended by: JOYCE NORLEEN BROCKS on: 07/18/2024 09:45 AM   Modules accepted: Orders

## 2024-08-06 ENCOUNTER — Other Ambulatory Visit: Payer: Self-pay | Admitting: Licensed Clinical Social Worker

## 2024-08-06 NOTE — Patient Instructions (Signed)
 Visit Information  Thank you for taking time to visit with me today. Please don't hesitate to contact me if I can be of assistance to you before our next scheduled appointment.  Patient has met all care management goals. Care Management case will be closed. Patient has been provided contact information should new needs arise.   Please call the care guide team at [phone] if you need to cancel, schedule, or reschedule an appointment.   Please call the Suicide and Crisis Lifeline: 988 go to [facility] Urgent [facility] [address] [place] [phone]) call 911 if you are experiencing a Mental Health or Behavioral Health Crisis or need someone to talk to.  Rolin Kerns, LCSW [place]  [facility], [facility] Clinical Social Worker Direct Dial: [phone]  Fax: [phone] Website: [email or web address] 10:33 AM

## 2024-08-06 NOTE — Patient Outreach (Signed)
 Complex Care Management   Visit Note  08/06/2024  Name:  Allison [name]: [id number] DOB: 1941/07/05  Situation: Referral received for Complex Care Management related to Grief I obtained verbal consent from Patient.  Visit completed with Patient  on the phone  Background:   Past Medical History:  Diagnosis Date   Arthritis    lower back   Former smoker    GERD (gastroesophageal reflux disease)    History of appendicitis    History of blood transfusion 1960   after delivery of son   History of cataract    History of kidney stones 1999   lithrotripsy   Hypercholesterolemia    Hyperplastic colon polyp    Hypertension    Hypothyroid    Iron deficiency anemia    Macular degeneration    bilateral   Pinched nerve    back   Seasonal allergies    RHINITIS   Tachycardia    Wears glasses    reading    Assessment: Patient Reported Symptoms:  Cognitive Cognitive Status: No symptoms reported, Alert and oriented to person, place, and time, Normal speech and language skills Cognitive/Intellectual Conditions Management [RPT]: None reported or documented in medical history or problem list   Health Maintenance Behaviors: None  Neurological Neurological Review of Symptoms: Not assessed    HEENT HEENT Symptoms Reported: Not assessed      Cardiovascular Cardiovascular Symptoms Reported: Not assessed    Respiratory Respiratory Symptoms Reported: Not assesed    Endocrine Endocrine Symptoms Reported: Not assessed    Gastrointestinal Gastrointestinal Symptoms Reported: Not assessed      Genitourinary Genitourinary Symptoms Reported: Not assessed    Integumentary Integumentary Symptoms Reported: Not assessed    Musculoskeletal Musculoskelatal Symptoms Reviewed: Not assessed        Psychosocial Psychosocial Symptoms Reported: No symptoms reported Additional Psychological Details: Pt denies any concerns regarding management of stress or grief. Endorsed having a strong  support system and utilization of healthy coping skills Behavioral Management Strategies: Coping strategies, Adequate rest, Support system Behavioral Health Self-Management Outcome: 4 (good) Major Change/Loss/Stressor/Fears (CP): Death of a loved one Techniques to Cope with Loss/Stress/Change: Diversional activities      08/06/2024    PHQ2-9 Depression Screening   Little interest or pleasure in doing things    Feeling down, depressed, or hopeless    PHQ-2 - Total Score    Trouble falling or staying asleep, or sleeping too much    Feeling tired or having little energy    Poor appetite or overeating     Feeling bad about yourself - or that you are a failure or have let yourself or your family down    Trouble concentrating on things, such as reading the newspaper or watching television    Moving or speaking so slowly that other people could have noticed.  Or the opposite - being so fidgety or restless that you have been moving around a lot more than usual    Thoughts that you would be better off dead, or hurting yourself in some way    PHQ2-9 Total Score    If you checked off any problems, how difficult have these problems made it for you to do your work, take care of things at home, or get along with other people    Depression Interventions/Treatment      There were no vitals filed for this visit.  Medications Reviewed Today     Reviewed by Kloee Ballew D, LCSW (Social Worker) on  08/06/24 at 1022  Med List Status: <None>   Medication Order Taking? Sig Documenting Provider Last Dose Status Informant  donepezil  (ARICEPT ) 10 MG tablet [id number]  Take 2 tablets at bedtime Lalonde, John C, MD  Active   levothyroxine  (SYNTHROID ) 100 MCG tablet [id number]  Take 1 tablet (100 mcg total) by mouth daily before breakfast. Joyce Norleen BROCKS, MD  Active   Multiple Vitamin (MULTIVITAMIN ADULT) TABS [id number]  Take 1 tablet by mouth daily. [provider]  Active Self  rosuvastatin   (CRESTOR ) 20 MG tablet [id number]  Take 1 tablet (20 mg total) by mouth daily. Joyce Norleen BROCKS, MD  Active             Recommendation:   Continue Current Plan of Care  Follow Up Plan:   Patient has met all care management goals. Care Management case will be closed. Patient has been provided contact information should new needs arise.   Rolin Ezzard HUGHS [facility]  [facility], [facility] Clinical Social Worker Direct Dial: [phone]  Fax: [phone] Website: [email or web address] 10:31 AM

## 2024-08-14 ENCOUNTER — Telehealth: Payer: Self-pay

## 2024-08-14 NOTE — Progress Notes (Addendum)
   08/14/2024  Patient ID: Allison [name], female   DOB: 1941-02-23, 83 y.o.   MRN: [id number]  Pharmacy Quality Measure Review  This patient is appearing on a report for being at risk of failing the adherence measure for cholesterol (statin) medications this calendar year.   Medication: Rosuvastatin  Last fill date: 03/02/24 for 90 day supply  Spoke with patient's son. He spoke with patient and she still has some in a bottle from May. Report that at some point she must have forgotten to take it, she will resume today. They will request a refill from [facility] today also to ensure she does not run out.   Jon VEAR [place], PharmD Clinical Pharmacist [phone]

## 2025-06-18 ENCOUNTER — Ambulatory Visit: Payer: Self-pay
